# Patient Record
Sex: Female | Born: 1950 | Race: White | Hispanic: No | Smoking: Former smoker
Health system: Southern US, Community
[De-identification: ages and names within clinical notes are randomized; demographics above are authoritative.]

## PROBLEM LIST (undated history)

## (undated) DIAGNOSIS — I959 Hypotension, unspecified: Secondary | ICD-10-CM

## (undated) DIAGNOSIS — C259 Malignant neoplasm of pancreas, unspecified: Secondary | ICD-10-CM

## (undated) DIAGNOSIS — F32A Depression, unspecified: Secondary | ICD-10-CM

## (undated) DIAGNOSIS — S42301A Unspecified fracture of shaft of humerus, right arm, initial encounter for closed fracture: Secondary | ICD-10-CM

## (undated) DIAGNOSIS — F329 Major depressive disorder, single episode, unspecified: Secondary | ICD-10-CM

## (undated) DIAGNOSIS — R109 Unspecified abdominal pain: Secondary | ICD-10-CM

## (undated) DIAGNOSIS — R296 Repeated falls: Secondary | ICD-10-CM

## (undated) DIAGNOSIS — S72001A Fracture of unspecified part of neck of right femur, initial encounter for closed fracture: Secondary | ICD-10-CM

## (undated) DIAGNOSIS — G8929 Other chronic pain: Secondary | ICD-10-CM

## (undated) DIAGNOSIS — C801 Malignant (primary) neoplasm, unspecified: Secondary | ICD-10-CM

## (undated) DIAGNOSIS — K529 Noninfective gastroenteritis and colitis, unspecified: Secondary | ICD-10-CM

## (undated) HISTORY — DX: Malignant neoplasm of pancreas, unspecified: C25.9

## (undated) HISTORY — PX: GASTROSTOMY TUBE PLACEMENT: SHX655

---

## 2008-06-06 ENCOUNTER — Emergency Department (HOSPITAL_COMMUNITY): Admission: EM | Admit: 2008-06-06 | Discharge: 2008-06-07 | Payer: Self-pay | Admitting: Emergency Medicine

## 2010-05-20 LAB — COMPREHENSIVE METABOLIC PANEL
ALT: 27 U/L (ref 0–35)
Alkaline Phosphatase: 125 U/L — ABNORMAL HIGH (ref 39–117)
CO2: 26 mEq/L (ref 19–32)
Calcium: 9.5 mg/dL (ref 8.4–10.5)
Chloride: 103 mEq/L (ref 96–112)
GFR calc non Af Amer: >60 mL/min (ref >60)
Glucose, Bld: 110 mg/dL — ABNORMAL HIGH (ref 70–99)
Sodium: 137 mEq/L (ref 135–145)
Total Bilirubin: 0.9 mg/dL (ref 0.3–1.2)

## 2010-05-20 LAB — URINALYSIS, ROUTINE W REFLEX MICROSCOPIC
Ketones, ur: NEGATIVE mg/dL
Leukocytes, UA: NEGATIVE
Nitrite: NEGATIVE
Protein, ur: NEGATIVE mg/dL
Urobilinogen, UA: 0.2 mg/dL (ref 0.0–1.0)

## 2010-05-20 LAB — CBC
Hemoglobin: 13.8 g/dL (ref 12.0–15.0)
MCHC: 34 g/dL (ref 30.0–36.0)
RBC: 4.67 MIL/uL (ref 3.87–5.11)

## 2010-05-20 LAB — POCT CARDIAC MARKERS
CKMB, poc: 1.1 ng/mL (ref 1.0–8.0)
Myoglobin, poc: 53.9 ng/mL (ref 12–200)
Troponin i, poc: <0.05 ng/mL (ref 0.00–0.09)

## 2010-05-20 LAB — DIFFERENTIAL
Eosinophils Relative: 2 % (ref 0–5)
Neutrophils Relative %: 56 % (ref 43–77)

## 2010-05-20 LAB — URINE MICROSCOPIC-ADD ON

## 2010-08-14 ENCOUNTER — Other Ambulatory Visit: Payer: Self-pay | Admitting: Medical

## 2010-09-01 ENCOUNTER — Other Ambulatory Visit: Payer: Self-pay | Admitting: Medical

## 2013-01-15 ENCOUNTER — Other Ambulatory Visit: Payer: Self-pay | Admitting: *Deleted

## 2014-02-27 ENCOUNTER — Encounter (HOSPITAL_COMMUNITY): Payer: Self-pay | Admitting: Emergency Medicine

## 2014-02-27 ENCOUNTER — Emergency Department (HOSPITAL_COMMUNITY)
Admission: EM | Admit: 2014-02-27 | Discharge: 2014-02-27 | Disposition: A | Discharge status: Home / Self Care | Payer: Medicaid Other | Attending: Emergency Medicine | Admitting: Emergency Medicine

## 2014-02-27 DIAGNOSIS — Z8781 Personal history of (healed) traumatic fracture: Secondary | ICD-10-CM | POA: Diagnosis not present

## 2014-02-27 DIAGNOSIS — F329 Major depressive disorder, single episode, unspecified: Secondary | ICD-10-CM | POA: Insufficient documentation

## 2014-02-27 DIAGNOSIS — R140 Abdominal distension (gaseous): Secondary | ICD-10-CM | POA: Diagnosis present

## 2014-02-27 DIAGNOSIS — Z8507 Personal history of malignant neoplasm of pancreas: Secondary | ICD-10-CM | POA: Diagnosis not present

## 2014-02-27 DIAGNOSIS — Z7982 Long term (current) use of aspirin: Secondary | ICD-10-CM | POA: Insufficient documentation

## 2014-02-27 DIAGNOSIS — K9413 Enterostomy malfunction: Secondary | ICD-10-CM | POA: Diagnosis not present

## 2014-02-27 DIAGNOSIS — G8929 Other chronic pain: Secondary | ICD-10-CM | POA: Insufficient documentation

## 2014-02-27 DIAGNOSIS — Z8679 Personal history of other diseases of the circulatory system: Secondary | ICD-10-CM | POA: Diagnosis not present

## 2014-02-27 DIAGNOSIS — T85598A Other mechanical complication of other gastrointestinal prosthetic devices, implants and grafts, initial encounter: Secondary | ICD-10-CM

## 2014-02-27 DIAGNOSIS — Z79899 Other long term (current) drug therapy: Secondary | ICD-10-CM | POA: Diagnosis not present

## 2014-02-27 HISTORY — DX: Other chronic pain: G89.29

## 2014-02-27 HISTORY — DX: Noninfective gastroenteritis and colitis, unspecified: K52.9

## 2014-02-27 HISTORY — DX: Major depressive disorder, single episode, unspecified: F32.9

## 2014-02-27 HISTORY — DX: Hypotension, unspecified: I95.9

## 2014-02-27 HISTORY — DX: Malignant (primary) neoplasm, unspecified: C80.1

## 2014-02-27 HISTORY — DX: Unspecified fracture of shaft of humerus, right arm, initial encounter for closed fracture: S42.301A

## 2014-02-27 HISTORY — DX: Unspecified abdominal pain: R10.9

## 2014-02-27 HISTORY — DX: Depression, unspecified: F32.A

## 2014-02-27 HISTORY — DX: Fracture of unspecified part of neck of right femur, initial encounter for closed fracture: S72.001A

## 2014-02-27 HISTORY — DX: Repeated falls: R29.6

## 2014-02-27 NOTE — ED Notes (Signed)
Pt was transported back to [facility], [place], via [facility].

## 2014-02-27 NOTE — ED Notes (Addendum)
Pt from [facility]. Pt reports g-tube obstruction x2 days. Pt reports staff at [facility] have tried coca-cola and ginger ale via tube with no relief. Pt denies any abdominal pain,n/v/d.

## 2014-02-27 NOTE — ED Notes (Addendum)
EDP at bedside requesting "guide wire." Central Line kit containing guidewire at bedside.

## 2014-02-27 NOTE — ED Notes (Signed)
Gave report on pt's discharge instructions to [place], at the [facility], [place], [place].

## 2014-02-27 NOTE — ED Provider Notes (Signed)
CSN: [id number]     Arrival date & time 02/27/14  1711 History  This chart was scribed for Tanna Furry, MD by Tula Nakayama, ED Scribe. This patient was seen in room APA18/APA18 and the patient's care was started at 6:29 PM.    Chief Complaint  Patient presents with  . Bloated   The history is provided by the patient. No language interpreter was used.    HPI Comments: Maureen Vaughn is a 64 y.o. female who presents to the Emergency Department complaining of a constant j-tube obstruction that started 3 days ago. Pt has 3 port jejunal tube and reports feeding tube is occluded. Nurses have tried flushing tube with normal saline and ginger ale with no relief. Pt is a resident of the [facility]. She denies associated pain.    Past Medical History  Diagnosis Date  . Cancer     pancreatic  . Hypotension   . Hip fracture, right   . Fracture of right humerus   . Depression   . Falls frequently   . Chronic abdominal pain   . Chronic diarrhea    Past Surgical History  Procedure Laterality Date  . Gastrostomy tube placement     History reviewed. No pertinent family history. History  Substance Use Topics  . Smoking status: Never Smoker   . Smokeless tobacco: Not on file  . Alcohol Use: No   OB History    No data available     Review of Systems  Constitutional: Negative for fever, chills, diaphoresis, appetite change and fatigue.  HENT: Negative for mouth sores, sore throat and trouble swallowing.   Eyes: Negative for visual disturbance.  Respiratory: Negative for cough, chest tightness, shortness of breath and wheezing.   Cardiovascular: Negative for chest pain.  Gastrointestinal: Negative for nausea, vomiting, abdominal pain, diarrhea and abdominal distention.  Endocrine: Negative for polydipsia, polyphagia and polyuria.  Genitourinary: Negative for dysuria, frequency and hematuria.  Musculoskeletal: Negative for gait problem.  Skin: Negative for color change, pallor and rash.   Neurological: Negative for dizziness, syncope, light-headedness and headaches.  Hematological: Does not bruise/bleed easily.  Psychiatric/Behavioral: Negative for behavioral problems and confusion.    Allergies  Review of patient's allergies indicates no known allergies.  Home Medications   Prior to Admission medications   Medication Sig Start Date End Date Taking? Authorizing Provider  aspirin EC 81 MG tablet 81 mg by Gastric Tube route daily.   Yes Historical Provider, MD  diazepam (VALIUM) 10 MG tablet 10 mg by Gastric Tube route every 8 (eight) hours.   Yes Historical Provider, MD  ferrous sulfate 300 (60 FE) MG/5ML syrup 300 mg by Gastric Tube route daily.   Yes Historical Provider, MD  FLUoxetine (PROZAC) 20 MG tablet 20 mg by Gastric Tube route daily.   Yes Historical Provider, MD  geriatric multivitamins-minerals (ELDERTONIC/GEVRABON) ELIX 15 mLs by Gastric Tube route 2 (two) times daily.   Yes Historical Provider, MD  lipase/protease/amylase (CREON) 12000 UNITS CPEP capsule 24,000 Units by Gastric Tube route 3 (three) times daily before meals.   Yes Historical Provider, MD  LORazepam (ATIVAN) 0.5 MG tablet Place 0.5 mg under the tongue as needed (for nausea).   Yes Historical Provider, MD  morphine (MS CONTIN) 15 MG 12 hr tablet 15 mg by Gastric Tube route every 12 (twelve) hours.   Yes Historical Provider, MD  Nutritional Supplements (FEEDING SUPPLEMENT, JEVITY 1.2 CAL,) LIQD Place 1,000 mLs into feeding tube See admin instructions. 12ms per hour for  24 hours. *Turn off at 10am, then resume at 2pm. Flush with 19ms of water every 8 hours. (326m prior to meds and 6075mpost meds if given via G-Tube*   Yes Historical Provider, MD  omeprazole (PRILOSEC) 20 MG capsule 20 mg by Gastric Tube route daily. *take 30 to 60 minutes before eating   Yes Historical Provider, MD  Oxycodone HCl 10 MG TABS 10 mg by Gastric Tube route every 6 (six) hours as needed (for pain).   Yes Historical  Provider, MD  potassium chloride SA (K-DUR,KLOR-CON) 20 MEQ tablet 20 mEq by Gastric Tube route 3 (three) times daily.   Yes Historical Provider, MD  sucralfate (CARAFATE) 1 GM/10ML suspension 1 g by Gastric Tube route every 6 (six) hours.   Yes Historical Provider, MD  vitamin B-12 (CYANOCOBALAMIN) 1000 MCG tablet 1,000 mcg by Gastric Tube route daily.   Yes Historical Provider, MD   BP 109/68 mmHg  Pulse 74  Temp(Src) 97.8 F (36.6 C) (Oral)  Resp 16  Ht 5' (1.524 m)  Wt 81 lb 4.8 oz (36.877 kg)  BMI 15.88 kg/m2  SpO2 95% Physical Exam  Constitutional: She is oriented to person, place, and time. She appears well-developed and well-nourished. No distress.  HENT:  Head: Normocephalic.  Eyes: Conjunctivae are normal. Pupils are equal, round, and reactive to light. No scleral icterus.  Neck: Normal range of motion. Neck supple. No thyromegaly present.  Cardiovascular: Normal rate and regular rhythm.  Exam reveals no gallop and no friction rub.   No murmur heard. Pulmonary/Chest: Effort normal and breath sounds normal. No respiratory distress. She has no wheezes. She has no rales.  Abdominal: Soft. Bowel sounds are normal. She exhibits no distension. There is no tenderness. There is no rebound.  epigastrium triple lumen jejunal feeding tube noted; gastric port patent; med port occluded  Musculoskeletal: Normal range of motion.  Neurological: She is alert and oriented to person, place, and time.  Skin: Skin is warm and dry. No rash noted.  Psychiatric: She has a normal mood and affect. Her behavior is normal.  Nursing note and vitals reviewed.   ED Course  Procedures (including critical care time) DIAGNOSTIC STUDIES: Oxygen Saturation is 97% on RA, normal by my interpretation.    COORDINATION OF CARE: 6:35 PM Discussed treatment plan with pt at bedside and pt agreed to plan.    Labs Review Labs Reviewed - No data to display  Imaging Review No results found.   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 Recent hip and humeral fractures. Failure to thrive. Accommodation G-tube/duodenal feeding tube placed at [facility]. In the emergency room here her aspiration port is patent. Her G-tube port is patent. The central jejunal feeding port is noted. I'm unable to pass total saline. A soft flexing wire from a central line kit was placed. Was measured carefully. It will not pass to the length to the tip of the feeding tube.  I discussed the case with Dr. RehDebby Bud on call. He felt the patient could be continued on her jejunal feeds at a lower rate or any non-bolus fashion through her G-tube. Information given to the patient's care facility to call to arrange exchange of her G-tube in invasive radiology  hospital.   I personally performed the services described in this documentation, which was scribed in  my presence. The recorded information has been reviewed and is accurate.    Tanna Furry, MD 02/27/14 [phone]

## 2014-02-27 NOTE — Discharge Instructions (Signed)
Tube will have to be changed in radiology at [facility], [place].  Please call interventional radiology at [facility] to arrange for this to be done. [phone]   Feeds can be given slowly through the patient's G tube port, which remains patent.   -

## 2014-02-27 NOTE — ED Notes (Addendum)
Three port g-tube noted to pt abdomen. Suction and med port patent. Feeding tube port not patent. Gastric contents noted in suction canister after suction port hooked up. Normal saline flushed through med port with no resistance by Agricultural consultant. Pt tolerated well. Ginger ale/normal saline irrigation of feeding port by [employer] also unsuccessful.

## 2015-03-31 ENCOUNTER — Ambulatory Visit (HOSPITAL_COMMUNITY)
Admission: RE | Admit: 2015-03-31 | Discharge: 2015-03-31 | Disposition: A | Discharge status: Home / Self Care | Payer: Medicaid Other | Source: Ambulatory Visit | Attending: Oncology | Admitting: Oncology

## 2015-03-31 DIAGNOSIS — N390 Urinary tract infection, site not specified: Secondary | ICD-10-CM | POA: Diagnosis not present

## 2015-03-31 DIAGNOSIS — C259 Malignant neoplasm of pancreas, unspecified: Secondary | ICD-10-CM | POA: Insufficient documentation

## 2015-03-31 MED ORDER — SODIUM CHLORIDE 0.9% FLUSH: 10.0000 mL | INTRAVENOUS | Status: DC | PRN

## 2015-03-31 MED ORDER — SODIUM CHLORIDE 0.9% FLUSH: 10.0000 mL | Freq: Two times a day (BID) | INTRAVENOUS | Status: DC

## 2015-03-31 NOTE — Discharge Instructions (Signed)
PICC Insertion, Care After Refer to this sheet in the next few weeks. These instructions provide you with information on caring for yourself after your procedure. Your health care provider may also give you more specific instructions. Your treatment has been planned according to current medical practices, but problems sometimes occur. Call your health care provider if you have any problems or questions after your procedure. WHAT TO EXPECT AFTER THE PROCEDURE After your procedure, it is typical to have the following:  Mild discomfort at the insertion site. This should not last more than a day. HOME CARE INSTRUCTIONS  Rest at home for the remainder of the day after the procedure.  You may bend your arm and move it freely. If your PICC is near or at the bend of your elbow, avoid activity with repeated motion at the elbow.  Avoid lifting heavy objects as instructed by your health care provider.  Avoid using a crutch with the arm on the same side as your PICC. You may need to use a walker. Bandage Care  Keep your PICC bandage (dressing) clean and dry to prevent infection.  Ask your health care provider when you may shower. To keep the dressing dry, cover the PICC with plastic wrap and tape before showering. If the dressing does become wet, replace it right after the shower.  Do not soak in the bath, swim, or use hot tubs when you have a PICC.  Change the PICC dressing as instructed by your health care provider.  Change your PICC dressing if it becomes loose or wet. General PICC Care  Check the PICC insertion site daily for leakage, redness, swelling, or pain.  Flush the PICC as directed by your health care provider. Let your health care provider know right away if the PICC is difficult to flush or does not flush. Do not use force to flush the PICC.  Do not use a syringe that is less than 10 mL to flush the PICC.  Never pull or tug on the PICC.  Avoid blood pressure checks on the arm  with the PICC.  Keep your PICC identification card with you at all times.  Do not take the PICC out yourself. Only a trained health care professional should remove the PICC. SEEK MEDICAL CARE IF:  You have pain in your arm, ear, face, or teeth.  You have fever or chills.  You have drainage from the PICC insertion site.  You have redness or palpate a "cord" around the PICC insertion site.  You cannot flush the catheter. SEEK IMMEDIATE MEDICAL CARE IF:  You have swelling in the arm in which the PICC is inserted.   This information is not intended to replace advice given to you by your health care provider. Make sure you discuss any questions you have with your health care provider.   Document Released: 11/15/2012 Document Revised: 01/30/2013 Document Reviewed: 11/15/2012 Elsevier Interactive Patient Education 2016 [place] Catheter/Midline Placement  The IV Nurse has discussed with the patient and/or persons authorized to consent for the patient, the purpose of this procedure and the potential benefits and risks involved with this procedure.  The benefits include less needle sticks, lab draws from the catheter and patient may be discharged home with the catheter.  Risks include, but not limited to, infection, bleeding, blood clot (thrombus formation), and puncture of an artery; nerve damage and irregular heat beat.  Alternatives to this procedure were also discussed.  PICC/Midline Placement Documentation  PICC Single  Lumen Q000111Q PICC Right Basilic 36 cm 1 cm (Active)  Indication for Insertion or Continuance of Line Administration of hyperosmolar/irritating solutions (i.e. TPN, Vancomycin, etc.);Prolonged intravenous therapies;Limited venous access - need for IV therapy >5 days (PICC only) 03/31/2015  2:23 PM  Exposed Catheter (cm) 1 cm 03/31/2015  2:23 PM  Site Assessment Clean;Dry;Intact 03/31/2015  2:23 PM  Line Status Blood return noted;Capped  (central line);Flushed 03/31/2015  2:23 PM  Dressing Type Transparent;Securing device 03/31/2015  2:23 PM  Dressing Status Clean;Dry;Intact 03/31/2015  2:23 PM  Line Care Cap(s) changed 03/31/2015  2:23 PM  Line Adjustment (NICU/IV Team Only) No 03/31/2015  2:23 PM  Dressing Intervention New dressing 03/31/2015  2:23 PM       Donya Hitch, Essie Hart 03/31/2015, 2:26 PM PICC Home Guide A peripherally inserted central catheter (PICC) is a long, thin, flexible tube that is inserted into a vein in the upper arm. It is a form of intravenous (IV) access. It is considered to be a "central" line because the tip of the PICC ends in a large vein in your chest. This large vein is called the superior vena cava (SVC). The PICC tip ends in the SVC because there is a lot of blood flow in the SVC. This allows medicines and IV fluids to be quickly distributed throughout the body. The PICC is inserted using a sterile technique by a specially trained nurse or physician. After the PICC is inserted, a chest X-ray exam is done to be sure it is in the correct place.  A PICC may be placed for different reasons, such as:  To give medicines and liquid nutrition that can only be given through a central line. Examples are:  Certain antibiotic treatments.  Chemotherapy.  Total parenteral nutrition (TPN).  To take frequent blood samples.  To give IV fluids and blood products.  If there is difficulty placing a peripheral intravenous (PIV) catheter. If taken care of properly, a PICC can remain in place for several months. A PICC can also allow a person to go home from the hospital early. Medicine and PICC care can be managed at home by a family member or home health care team. [place] A PICC? Problems with a PICC can occasionally occur. These may include the following:  A blood clot (thrombus) forming in or at the tip of the PICC. This can cause the PICC to become clogged. A clot-dissolving  medicine called tissue plasminogen activator (tPA) can be given through the PICC to help break up the clot.  Inflammation of the vein (phlebitis) in which the PICC is placed. Signs of inflammation may include redness, pain at the insertion site, red streaks, or being able to feel a "cord" in the vein where the PICC is located.  Infection in the PICC or at the insertion site. Signs of infection may include fever, chills, redness, swelling, or pus drainage from the PICC insertion site.  PICC movement (malposition). The PICC tip may move from its original position due to excessive physical activity, forceful coughing, sneezing, or vomiting.  A break or cut in the PICC. It is important to not use scissors near the PICC.  Nerve or tendon irritation or injury during PICC insertion. WHAT SHOULD I KEEP IN MIND ABOUT ACTIVITIES WHEN I HAVE A PICC?  You may bend your arm and move it freely. If your PICC is near or at the bend of your elbow, avoid activity with repeated motion at the elbow.  Rest at home for the remainder of the day following PICC line insertion.  Avoid lifting heavy objects as instructed by your health care provider.  Avoid using a crutch with the arm on the same side as your PICC. You may need to use a walker. WHAT SHOULD I KNOW ABOUT MY PICC DRESSING?  Keep your PICC bandage (dressing) clean and dry to prevent infection.  Ask your health care provider when you may shower. Ask your health care provider to teach you how to wrap the PICC when you do take a shower.  Change the PICC dressing as instructed by your health care provider.  Change your PICC dressing if it becomes loose or wet. WHAT SHOULD I KNOW ABOUT PICC CARE?  Check the PICC insertion site daily for leakage, redness, swelling, or pain.  Do not take a bath, swim, or use hot tubs when you have a PICC. Cover PICC line with clear plastic wrap and tape to keep it dry while showering.  Flush the PICC as directed by  your health care provider. Let your health care provider know right away if the PICC is difficult to flush or does not flush. Do not use force to flush the PICC.  Do not use a syringe that is less than 10 mL to flush the PICC.  Never pull or tug on the PICC.  Avoid blood pressure checks on the arm with the PICC.  Keep your PICC identification card with you at all times.  Do not take the PICC out yourself. Only a trained clinical professional should remove the PICC. SEEK IMMEDIATE MEDICAL CARE IF:  Your PICC is accidentally pulled all the way out. If this happens, cover the insertion site with a bandage or gauze dressing. Do not throw the PICC away. Your health care provider will need to inspect it.  Your PICC was tugged or pulled and has partially come out. Do not  push the PICC back in.  There is any type of drainage, redness, or swelling where the PICC enters the skin.  You cannot flush the PICC, it is difficult to flush, or the PICC leaks around the insertion site when it is flushed.  You hear a "flushing" sound when the PICC is flushed.  You have pain, discomfort, or numbness in your arm, shoulder, or jaw on the same side as the PICC.  You feel your heart "racing" or skipping beats.  You notice a hole or tear in the PICC.  You develop chills or a fever. MAKE SURE YOU:   Understand these instructions.  Will watch your condition.  Will get help right away if you are not doing well or get worse.   This information is not intended to replace advice given to you by your health care provider. Make sure you discuss any questions you have with your health care provider.   Document Released: 08/01/2002 Document Revised: 02/15/2014 Document Reviewed: 10/02/2012 Elsevier Interactive Patient Education [employer].

## 2015-03-31 NOTE — Progress Notes (Signed)
Peripherally Inserted Central Catheter/Midline Placement  The IV Nurse has discussed with the patient and/or persons authorized to consent for the patient, the purpose of this procedure and the potential benefits and risks involved with this procedure.  The benefits include less needle sticks, lab draws from the catheter and patient may be discharged home with the catheter.  Risks include, but not limited to, infection, bleeding, blood clot (thrombus formation), and puncture of an artery; nerve damage and irregular heat beat.  Alternatives to this procedure were also discussed.  PICC/Midline Placement Documentation  PICC Single Lumen Q000111Q PICC Right Basilic 36 cm 1 cm (Active)  Indication for Insertion or Continuance of Line Administration of hyperosmolar/irritating solutions (i.e. TPN, Vancomycin, etc.);Prolonged intravenous therapies;Limited venous access - need for IV therapy >5 days (PICC only) 03/31/2015  2:23 PM  Exposed Catheter (cm) 1 cm 03/31/2015  2:23 PM  Site Assessment Clean;Dry;Intact 03/31/2015  2:23 PM  Line Status Blood return noted;Capped (central line);Flushed 03/31/2015  2:23 PM  Dressing Type Transparent;Securing device 03/31/2015  2:23 PM  Dressing Status Clean;Dry;Intact 03/31/2015  2:23 PM  Line Care Cap(s) changed 03/31/2015  2:23 PM  Line Adjustment (NICU/IV Team Only) No 03/31/2015  2:23 PM  Dressing Intervention New dressing 03/31/2015  2:23 PM       Leiani Enright, Essie Hart 03/31/2015, 2:26 PM

## 2015-12-22 DIAGNOSIS — E7801 Familial hypercholesterolemia: Secondary | ICD-10-CM | POA: Diagnosis not present

## 2015-12-22 DIAGNOSIS — I10 Essential (primary) hypertension: Secondary | ICD-10-CM | POA: Diagnosis not present

## 2015-12-22 DIAGNOSIS — R300 Dysuria: Secondary | ICD-10-CM | POA: Diagnosis not present

## 2015-12-23 ENCOUNTER — Encounter (HOSPITAL_COMMUNITY): Payer: Medicare Other | Attending: Oncology | Admitting: Oncology

## 2015-12-23 ENCOUNTER — Encounter (HOSPITAL_COMMUNITY): Payer: Self-pay | Admitting: Oncology

## 2015-12-23 ENCOUNTER — Encounter (HOSPITAL_COMMUNITY): Payer: Medicare Other

## 2015-12-23 VITALS — BP 125/56 | HR 62 | Temp 97.8°F | Resp 18 | Ht 60.0 in | Wt 112.6 lb

## 2015-12-23 DIAGNOSIS — F1021 Alcohol dependence, in remission: Secondary | ICD-10-CM

## 2015-12-23 DIAGNOSIS — Z87891 Personal history of nicotine dependence: Secondary | ICD-10-CM | POA: Diagnosis not present

## 2015-12-23 DIAGNOSIS — Z862 Personal history of diseases of the blood and blood-forming organs and certain disorders involving the immune mechanism: Secondary | ICD-10-CM

## 2015-12-23 DIAGNOSIS — C259 Malignant neoplasm of pancreas, unspecified: Secondary | ICD-10-CM | POA: Diagnosis not present

## 2015-12-23 DIAGNOSIS — C250 Malignant neoplasm of head of pancreas: Secondary | ICD-10-CM | POA: Diagnosis present

## 2015-12-23 DIAGNOSIS — Z87898 Personal history of other specified conditions: Secondary | ICD-10-CM

## 2015-12-23 DIAGNOSIS — R197 Diarrhea, unspecified: Secondary | ICD-10-CM

## 2015-12-23 DIAGNOSIS — K909 Intestinal malabsorption, unspecified: Secondary | ICD-10-CM

## 2015-12-23 HISTORY — DX: Malignant neoplasm of pancreas, unspecified: C25.9

## 2015-12-23 LAB — CBC WITH DIFFERENTIAL/PLATELET
BASOS ABS: 0 10*3/uL (ref 0.0–0.1)
BASOS PCT: 0 %
EOS ABS: 0.2 10*3/uL (ref 0.0–0.7)
EOS PCT: 2 %
HCT: 38.3 % (ref 36.0–46.0)
HEMOGLOBIN: 12.5 g/dL (ref 12.0–15.0)
Lymphocytes Relative: 20 %
Lymphs Abs: 2.4 10*3/uL (ref 0.7–4.0)
MCH: 27.4 pg (ref 26.0–34.0)
MCHC: 32.6 g/dL (ref 30.0–36.0)
MCV: 84 fL (ref 78.0–100.0)
Monocytes Absolute: 0.7 10*3/uL (ref 0.1–1.0)
Monocytes Relative: 6 %
NEUTROS PCT: 72 %
Neutro Abs: 8.8 10*3/uL — ABNORMAL HIGH (ref 1.7–7.7)
PLATELETS: 264 10*3/uL (ref 150–400)
RBC: 4.56 MIL/uL (ref 3.87–5.11)
RDW: 16.9 % — ABNORMAL HIGH (ref 11.5–15.5)
WBC: 12.1 10*3/uL — AB (ref 4.0–10.5)

## 2015-12-23 LAB — COMPREHENSIVE METABOLIC PANEL
ALBUMIN: 4.1 g/dL (ref 3.5–5.0)
ALT: 21 U/L (ref 14–54)
AST: 29 U/L (ref 15–41)
Alkaline Phosphatase: 119 U/L (ref 38–126)
Anion gap: 7 (ref 5–15)
BUN: 13 mg/dL (ref 6–20)
CHLORIDE: 105 mmol/L (ref 101–111)
CO2: 25 mmol/L (ref 22–32)
CREATININE: 0.94 mg/dL (ref 0.44–1.00)
Calcium: 9.2 mg/dL (ref 8.9–10.3)
GFR calc non Af Amer: >60 mL/min (ref >60)
GLUCOSE: 101 mg/dL — AB (ref 65–99)
Potassium: 4.2 mmol/L (ref 3.5–5.1)
SODIUM: 137 mmol/L (ref 135–145)
Total Bilirubin: 0.6 mg/dL (ref 0.3–1.2)
Total Protein: 7.4 g/dL (ref 6.5–8.1)

## 2015-12-23 NOTE — Patient Instructions (Signed)
[place] at [facility] Discharge Instructions  RECOMMENDATIONS MADE BY THE CONSULTANT AND ANY TEST RESULTS WILL BE SENT TO YOUR REFERRING PHYSICIAN.  You were seen today by Kirby Crigler PA-C and Dr. Whitney Muse. Labs drawn today, will call with results. Ct scans will be scheduled. Return in 6 months for labs, Ct scans and follow up with Dr. Whitney Muse.   Thank you for choosing [place] at [facility] to provide your oncology and hematology care.  To afford each patient quality time with our provider, please arrive at least 15 minutes before your scheduled appointment time.   Beginning January 23rd 2017 lab work for the [facility] will be done in the  Main lab at [facility] on 1st floor. If you have a lab appointment with the [facility] please come in thru the  Main Entrance and check in at the main information desk  You need to re-schedule your appointment should you arrive 10 or more minutes late.  We strive to give you quality time with our providers, and arriving late affects you and other patients whose appointments are after yours.  Also, if you no show three or more times for appointments you may be dismissed from the clinic at the providers discretion.     Again, thank you for choosing [facility].  Our hope is that these requests will decrease the amount of time that you wait before being seen by our physicians.       _____________________________________________________________  Should you have questions after your visit to [facility], please contact our office at [phone] between the hours of 8:30 a.m. and 4:30 p.m.  Voicemails left after 4:30 p.m. will not be returned until the following business day.  For prescription refill requests, have your pharmacy contact our office.         Resources For Cancer Patients and their Caregivers ? American Cancer Society: Can assist with transportation,  wigs, general needs, runs Look Good Feel Better.        (548)381-6582 ? Cancer Care: Provides financial assistance, online support groups, medication/co-pay assistance.  1-800-813-HOPE 806-395-2441) ? [place] Assists [employer] cancer patients and their families through emotional , educational and financial support.  304-775-9531 ? [place] DSS Where to apply for food stamps, Medicaid and utility assistance. 623-619-4850 ? [facility]: Transportation to medical appointments. 331 090 8395 ? Social Security Administration: May apply for disability if have a Stage IV cancer. (917) 514-0679 (952) 618-5771 ? [employer], Disability and Transit Services: Assists with nutrition, care and transit needs. [place] Support Programs: @10RELATIVEDAYS @ > Cancer Support Group  2nd Tuesday of the month 1pm-2pm, Journey Room  > Creative Journey  3rd Tuesday of the month 1130am-1pm, Journey Room  > Look Good Feel Better  1st Wednesday of the month 10am-12 noon, Journey Room (Call [place] to register [phone])

## 2015-12-23 NOTE — Progress Notes (Signed)
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 Results of the tumor marker test revealed 86.      05/24/2013 Tumor Marker    Patient's tumor was tested for the following markers: CEA. Results of the tumor marker test revealed 5.      06/18/2013 Procedure    Malignant distal CBD stricture in distal 2-3cm of CBD- 10 x 73mm covered metal stent placed         06/21/2013 Procedure    Diagnostic laparoscopy, open pylorus preserving pancreaticoduodenectomy, omentectomy by Dr. Maggie Font ([place])        06/26/2013 Cancer Staging    T3N1MX      06/26/2013 Pathology Results    Adenocarcinoma of distal common bile duct in the head of pancreas, 1.2 cm, moderately differentiated, 1 mitosis/10 HPF, negative margines, perineural invasion identified, peripancreatic soft tissue invasion seen, tumor involves pancreatic acinar tissue and submucosa of duodenum near ampulla, 2/7 positive nodes       HISTORY OF PRESENT ILLNESS:   Maureen Vaughn is a 65 y.o. 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 Maggie Font on 06/21/2013 with a Whipple.  Post-operatively, she was planned for adjuvant systemic chemotherapy but due to a complicated post-operative course, adjuvant chemotherapy never took place.She did have an elevated pre-op CEA (5) and CA19-9 (86).  She reports  chronic abdominal that comes and goes.  Her appetite is strong.  Her weight is reportedly stable.  She notes that her abdominal pain is diffuse abdominal pain, but she points to her epigastric region.    We reviewed her oncology history.  She confirms a complicated post-operative course which delayed and then resulted in the decision to forego adjuvant chemotherapy.  She confirms that she has never received XRT as well.  Her friend notes that her weight has significantly improved since her surgery.  She weighed as little as 72 lbs.    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  She is NOT up to date on mammograms and she refuses to allow me to order and schedule one. She has no major complaints today.    Review of Systems  Constitutional: Negative.  Negative for chills, fever and weight loss.  HENT: Negative.   Eyes: Negative.   Respiratory: Negative.  Negative for cough.   Cardiovascular: Negative.  Negative for chest pain.  Gastrointestinal: Positive for abdominal pain (intermittent, chronic). Negative for constipation, diarrhea, nausea and vomiting.  Genitourinary: Negative for dysuria.  Musculoskeletal: Negative.   Skin: Negative.   Neurological: Negative.  Negative for weakness.  Endo/Heme/Allergies: Negative.   Psychiatric/Behavioral: Negative.   14 point review of systems was performed and is negative except as detailed under history of present illness and above   PAST MEDICAL HISTORY:   Past Medical History:  Diagnosis Date  . Adenocarcinoma  of pancreas ([place]) 12/23/2015  . Cancer [facility])    pancreatic  . Chronic abdominal pain   . Chronic diarrhea   . Depression   . Falls frequently   . Fracture of right humerus   . Hip fracture, right ([place])   . Hypotension     ALLERGIES: No Known Allergies    MEDICATIONS: I have reviewed the patient's current medications.    Current Outpatient Prescriptions on File Prior to Visit  Medication  Sig Dispense Refill  . Oxycodone HCl 10 MG TABS 10 mg by Gastric Tube route every 6 (six) hours as needed (for pain).     No current facility-administered medications on file prior to visit.      PAST SURGICAL HISTORY Past Surgical History:  Procedure Laterality Date  . GASTROSTOMY TUBE PLACEMENT      FAMILY HISTORY: Family History  Problem Relation Age of Onset  . Adopted: Yes   She is adopted.  1 son 49 yo and 1 daughter 25 yo both healthy.  2 grandchildren and 4 great-grandchildren.  SOCIAL HISTORY:  reports that she quit smoking about 5 years ago. She has a 70.00 pack-year smoking history. She has never used smokeless tobacco. She reports that she does not drink alcohol or use drugs.  She has a history of EtOHism, drinking 1/2 to 1 case of beer per week.  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  Social History   Social History  . Marital status: Legally Separated    Spouse name: N/A  . Number of children: N/A  . Years of education: N/A   Social History Main Topics  . Smoking status: Former Smoker    Packs/day: 2.00    Years: 35.00    Quit date: 12/23/2010  . Smokeless tobacco: Never Used  . Alcohol use No     Comment: H/O of EtOHism drinking 1/2-1 case of beer per week  . Drug use: No  . Sexual activity: Not Asked   Other Topics Concern  . None   Social History Narrative  . None    PERFORMANCE STATUS: The patient's performance status is 1 - Symptomatic but completely ambulatory  PHYSICAL EXAM: Most Recent Vital Signs: Blood pressure (!) 125/56, pulse 62, temperature 97.8 F (36.6 C), temperature source Oral, resp. rate 18, height 5' (1.524 m), weight 112 lb 9.6 oz (51.1 kg), SpO2 98 %. General appearance: alert, cooperative, appears older than stated age, no distress and accompanied by friend/caretaker Head: Normocephalic, without obvious abnormality, atraumatic Eyes: negative findings: lids and lashes  normal, conjunctivae and sclerae normal and corneas clear Ears: normal TM's and external ear canals both ears Throat: normal findings: lips normal without lesions, buccal mucosa normal, palate normal, tongue midline and normal, soft palate, uvula, and tonsils normal and oropharynx pink & moist without lesions or evidence of thrush and abnormal findings: dentition: upper dentures Neck: no adenopathy and supple, symmetrical, trachea midline Lungs: clear to auscultation bilaterally and normal percussion bilaterally Heart: regular rate and rhythm, S1, S2 normal, no murmur, click, rub or gallop Abdomen: soft, non-tender; bowel sounds normal; no masses,  no organomegaly Extremities: extremities normal, atraumatic, no cyanosis or edema Skin: Skin color, texture, turgor normal. No rashes or lesions Lymph nodes: Cervical, supraclavicular, and axillary nodes normal. Neurologic: Grossly normal  LABORATORY DATA:  CBC    Component Value Date/Time   WBC 12.1 (H) 12/23/2015 1309   RBC 4.56 12/23/2015 1309  HGB 12.5 12/23/2015 1309   HCT 38.3 12/23/2015 1309   PLT 264 12/23/2015 1309   MCV 84.0 12/23/2015 1309   MCH 27.4 12/23/2015 1309   MCHC 32.6 12/23/2015 1309   RDW 16.9 (H) 12/23/2015 1309   LYMPHSABS 2.4 12/23/2015 1309   MONOABS 0.7 12/23/2015 1309   EOSABS 0.2 12/23/2015 1309   BASOSABS 0.0 12/23/2015 1309     Chemistry      Component Value Date/Time   NA 137 12/23/2015 1309   K 4.2 12/23/2015 1309   CL 105 12/23/2015 1309   CO2 25 12/23/2015 1309   BUN 13 12/23/2015 1309   CREATININE 0.94 12/23/2015 1309      Component Value Date/Time   CALCIUM 9.2 12/23/2015 1309   ALKPHOS 119 12/23/2015 1309   AST 29 12/23/2015 1309   ALT 21 12/23/2015 1309   BILITOT 0.6 12/23/2015 1309     Results for HELANA, KNABLE (MRN [id number])   Ref. 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 Maggie Font on 06/21/2013 with a Whipple.  Post-operatively, she was planned for adjuvant systemic chemotherapy but due to a complicated post-operative course, adjuvant chemotherapy never took place.  She did have an elevated pre-op CEA (5) and CA19-9 (86).  Oncology history is developed with available information.  She is on creon for her diarrhea, it is stable and unchanged. She has regained significant weight since her surgery.  Thrombocytosis resolved.  Staging in [facility] problem list is completed at well.  Labs today: CBC diff, CMET, CEA, CA 19-9.  Labs in 6 months: CBC diff, CMET, CEA, CA-19-9.  Order is placed for CT abd/pelvis with contrast in 1-2 weeks for surveillance of pancreatic cancer.   Order is placed for CT abd w contrast in 6 months for ongoing surveillance of pancreatic cancer.  Return in 6 months to review labs and imaging, and for ongoing follow-up in accordance with NCCN guidelines.  Of course, if her labs or imaging are abnormal, we will re-call the patient back to the clinic for a follow-up visit sooner than initially planned.  NCCN guidelines pertaining to surveillance of pancreatic cancer in the postoperative adjuvant setting is as follows (3.2017): 1. Surveillance every 3-6 months for 2 years, then 6-12 months.  A. H+P for symptom assessment  B. CA 19-9 level (category 2B)  C. CT abdomen with contrast (category 2B)    ORDERS PLACED FOR THIS ENCOUNTER: Orders Placed This Encounter  Procedures  . CT Abdomen Pelvis W Contrast  . CT Abdomen W Contrast  . CBC with Differential  . Comprehensive metabolic panel  . CEA  . Cancer antigen 19-9  . CBC with Differential  .  Comprehensive metabolic panel  . CEA  . Cancer antigen 19-9  . Ambulatory referral to Social Work  . Ambulatory referral to Social Work   No orders of the defined types were placed in this encounter.   All questions were answered. The patient knows to call the clinic with any problems, questions or concerns. We can certainly see the patient much sooner if necessary.  This note is electronically signed [id number] Cyril Mourning,  MD  12/23/2015 5:23 PM

## 2015-12-23 NOTE — Assessment & Plan Note (Addendum)
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 Maggie Font on 06/21/2013 with a Whipple.  Post-operatively, she was planned for adjuvant systemic chemotherapy but due to a complicated post-operative course, adjuvant chemotherapy never took place.  She did have an elevated pre-op CEA (5) and CA19-9 (86).  Oncology history is developed with available information.  Staging in [facility] problem list is completed at well.  Labs today: CBC diff, CMET, CEA, CA 19-9.  Labs in 6 months: CBC diff, CMET, CEA, CA-19-9.  Order is placed for CT abd/pelvis with contrast in 1-2 weeks for surveillance of pancreatic cancer.   Order is placed for CT abd w contrast in 6 months for ongoing surveillance of pancreatic cancer.  Return in 6 months to review labs and imaging, and for ongoing follow-up in accordance with NCCN guidelines.  Of course, if her labs or imaging are abnormal, we will re-call the patient back to the clinic for a follow-up visit sooner than initially planned.  NCCN guidelines pertaining to surveillance of pancreatic cancer in the postoperative adjuvant setting is as follows (3.2017): 1. Surveillance every 3-6 months for 2 years, then 6-12 months.  A. H+P for symptom assessment  B. CA 19-9 level (category 2B)  C. CT abdomen with contrast (category 2B)

## 2015-12-24 LAB — CEA: CEA: 3.3 ng/mL (ref 0.0–4.7)

## 2015-12-24 LAB — CANCER ANTIGEN 19-9: CA 19 9: 13 U/mL (ref 0–35)

## 2015-12-28 ENCOUNTER — Encounter (HOSPITAL_COMMUNITY): Payer: Self-pay | Admitting: Oncology

## 2016-01-06 ENCOUNTER — Encounter: Payer: Self-pay | Admitting: *Deleted

## 2016-01-06 ENCOUNTER — Ambulatory Visit (HOSPITAL_COMMUNITY)
Admission: RE | Admit: 2016-01-06 | Discharge: 2016-01-06 | Disposition: A | Discharge status: Home / Self Care | Payer: Medicare Other | Source: Ambulatory Visit | Attending: Oncology | Admitting: Oncology

## 2016-01-06 DIAGNOSIS — Z90411 Acquired partial absence of pancreas: Secondary | ICD-10-CM | POA: Insufficient documentation

## 2016-01-06 DIAGNOSIS — C259 Malignant neoplasm of pancreas, unspecified: Secondary | ICD-10-CM | POA: Diagnosis not present

## 2016-01-06 DIAGNOSIS — M4317 Spondylolisthesis, lumbosacral region: Secondary | ICD-10-CM | POA: Insufficient documentation

## 2016-01-06 MED ORDER — IOPAMIDOL (ISOVUE-300) INJECTION 61%
100.0000 mL | Freq: Once | INTRAVENOUS | Status: AC | PRN
  Administered 2016-01-06: 100 mL via INTRAVENOUS

## 2016-01-06 NOTE — Progress Notes (Signed)
[place] Psychosocial Distress Screening Clinical Social Work  Clinical Social Work was referred by distress screening protocol.  The patient scored a 8 on the Psychosocial Distress Thermometer which indicates severe distress. Clinical Social Worker reviewed chart and contacted patient at home to assess for distress and other psychosocial needs. Pt appears to be prescribed medication to address her depression and anxiety concerns. CSW left brief, supportive message explaining role of CSW/Support Programs at [facility]. CSW encouraged pt to return CSW phone call as needed.   ONCBCN DISTRESS SCREENING 12/23/2015  Screening Type Initial Screening  Distress experienced in past week (1-10) 8  Family Problem type Children  Emotional problem type Depression;Nervousness/Anxiety  Physical Problem type Pain;Swollen arms/legs;Other (comment)    Clinical Social Worker follow up needed: No.  If yes, follow up plan:  Loren Racer, [place] Tuesdays   [phone]

## 2016-04-06 DIAGNOSIS — M5136 Other intervertebral disc degeneration, lumbar region: Secondary | ICD-10-CM | POA: Diagnosis not present

## 2016-04-06 DIAGNOSIS — I700 Atherosclerosis of aorta: Secondary | ICD-10-CM | POA: Diagnosis not present

## 2016-04-06 DIAGNOSIS — M5137 Other intervertebral disc degeneration, lumbosacral region: Secondary | ICD-10-CM | POA: Diagnosis not present

## 2016-04-06 DIAGNOSIS — M4307 Spondylolysis, lumbosacral region: Secondary | ICD-10-CM | POA: Diagnosis not present

## 2016-04-06 DIAGNOSIS — M5410 Radiculopathy, site unspecified: Secondary | ICD-10-CM | POA: Diagnosis not present

## 2016-06-17 ENCOUNTER — Encounter (HOSPITAL_COMMUNITY): Payer: Medicare Other | Attending: Oncology

## 2016-06-17 DIAGNOSIS — Z79899 Other long term (current) drug therapy: Secondary | ICD-10-CM | POA: Diagnosis not present

## 2016-06-17 DIAGNOSIS — Z87891 Personal history of nicotine dependence: Secondary | ICD-10-CM | POA: Insufficient documentation

## 2016-06-17 DIAGNOSIS — C259 Malignant neoplasm of pancreas, unspecified: Secondary | ICD-10-CM

## 2016-06-17 DIAGNOSIS — Z8507 Personal history of malignant neoplasm of pancreas: Secondary | ICD-10-CM | POA: Diagnosis not present

## 2016-06-17 LAB — CBC WITH DIFFERENTIAL/PLATELET
Basophils Absolute: 0 10*3/uL (ref 0.0–0.1)
Basophils Relative: 0 %
EOS ABS: 0.3 10*3/uL (ref 0.0–0.7)
EOS PCT: 3 %
HCT: 34.5 % — ABNORMAL LOW (ref 36.0–46.0)
Hemoglobin: 11.1 g/dL — ABNORMAL LOW (ref 12.0–15.0)
LYMPHS ABS: 2.9 10*3/uL (ref 0.7–4.0)
Lymphocytes Relative: 25 %
MCH: 27.9 pg (ref 26.0–34.0)
MCHC: 32.2 g/dL (ref 30.0–36.0)
MCV: 86.7 fL (ref 78.0–100.0)
MONO ABS: 0.5 10*3/uL (ref 0.1–1.0)
MONOS PCT: 5 %
Neutro Abs: 7.9 10*3/uL — ABNORMAL HIGH (ref 1.7–7.7)
Neutrophils Relative %: 67 %
PLATELETS: 308 10*3/uL (ref 150–400)
RBC: 3.98 MIL/uL (ref 3.87–5.11)
RDW: 16.4 % — AB (ref 11.5–15.5)
WBC: 11.6 10*3/uL — AB (ref 4.0–10.5)

## 2016-06-17 LAB — COMPREHENSIVE METABOLIC PANEL
ALT: 15 U/L (ref 14–54)
ANION GAP: 8 (ref 5–15)
AST: 22 U/L (ref 15–41)
Albumin: 3.7 g/dL (ref 3.5–5.0)
Alkaline Phosphatase: 79 U/L (ref 38–126)
BUN: 15 mg/dL (ref 6–20)
CHLORIDE: 108 mmol/L (ref 101–111)
CO2: 22 mmol/L (ref 22–32)
Calcium: 8.6 mg/dL — ABNORMAL LOW (ref 8.9–10.3)
Creatinine, Ser: 0.95 mg/dL (ref 0.44–1.00)
Glucose, Bld: 105 mg/dL — ABNORMAL HIGH (ref 65–99)
Potassium: 4.5 mmol/L (ref 3.5–5.1)
SODIUM: 138 mmol/L (ref 135–145)
Total Bilirubin: 0.4 mg/dL (ref 0.3–1.2)
Total Protein: 6.9 g/dL (ref 6.5–8.1)

## 2016-06-18 LAB — CANCER ANTIGEN 19-9: CA 19-9: 12 U/mL (ref 0–35)

## 2016-06-18 LAB — CEA: CEA: 3.2 ng/mL (ref 0.0–4.7)

## 2016-06-21 ENCOUNTER — Ambulatory Visit (HOSPITAL_COMMUNITY)
Admission: RE | Admit: 2016-06-21 | Discharge: 2016-06-21 | Disposition: A | Discharge status: Home / Self Care | Payer: Medicare Other | Source: Ambulatory Visit | Attending: Oncology | Admitting: Oncology

## 2016-06-21 DIAGNOSIS — C259 Malignant neoplasm of pancreas, unspecified: Secondary | ICD-10-CM | POA: Diagnosis not present

## 2016-06-21 DIAGNOSIS — I700 Atherosclerosis of aorta: Secondary | ICD-10-CM | POA: Insufficient documentation

## 2016-06-21 MED ORDER — IOPAMIDOL (ISOVUE-300) INJECTION 61%
100.0000 mL | Freq: Once | INTRAVENOUS | Status: AC | PRN
  Administered 2016-06-21: 100 mL via INTRAVENOUS

## 2016-06-24 ENCOUNTER — Encounter (HOSPITAL_BASED_OUTPATIENT_CLINIC_OR_DEPARTMENT_OTHER): Payer: Medicare Other | Admitting: Adult Health

## 2016-06-24 ENCOUNTER — Encounter (HOSPITAL_COMMUNITY): Payer: Self-pay | Admitting: Adult Health

## 2016-06-24 ENCOUNTER — Other Ambulatory Visit (HOSPITAL_COMMUNITY): Payer: Medicaid Other

## 2016-06-24 VITALS — BP 140/66 | HR 70 | Temp 98.5°F | Resp 18 | Ht 60.0 in | Wt 122.5 lb

## 2016-06-24 DIAGNOSIS — K8689 Other specified diseases of pancreas: Secondary | ICD-10-CM

## 2016-06-24 DIAGNOSIS — M25551 Pain in right hip: Secondary | ICD-10-CM | POA: Diagnosis not present

## 2016-06-24 DIAGNOSIS — M545 Low back pain: Secondary | ICD-10-CM | POA: Diagnosis not present

## 2016-06-24 DIAGNOSIS — C259 Malignant neoplasm of pancreas, unspecified: Secondary | ICD-10-CM

## 2016-06-24 DIAGNOSIS — M79604 Pain in right leg: Secondary | ICD-10-CM

## 2016-06-24 DIAGNOSIS — G8929 Other chronic pain: Secondary | ICD-10-CM | POA: Diagnosis not present

## 2016-06-24 DIAGNOSIS — M79605 Pain in left leg: Secondary | ICD-10-CM | POA: Diagnosis not present

## 2016-06-24 DIAGNOSIS — R197 Diarrhea, unspecified: Secondary | ICD-10-CM

## 2016-06-24 MED ORDER — PANCRELIPASE (LIP-PROT-AMYL) 36000-114000 UNITS PO CPEP: 36000.0000 [IU] | ORAL_CAPSULE | Freq: Three times a day (TID) | ORAL | 6 refills | Status: DC

## 2016-06-24 NOTE — Patient Instructions (Addendum)
[place] at [facility] Discharge Instructions  RECOMMENDATIONS MADE BY THE CONSULTANT AND ANY TEST RESULTS WILL BE SENT TO YOUR REFERRING PHYSICIAN.  You were seen today by Mike Craze NP. Please take Creon 1 capsule with meals.  Return in 6 months for labs and follow up.   Thank you for choosing [place] at [facility] to provide your oncology and hematology care.  To afford each patient quality time with our provider, please arrive at least 15 minutes before your scheduled appointment time.    If you have a lab appointment with the [facility] please come in thru the  Main Entrance and check in at the main information desk  You need to re-schedule your appointment should you arrive 10 or more minutes late.  We strive to give you quality time with our providers, and arriving late affects you and other patients whose appointments are after yours.  Also, if you no show three or more times for appointments you may be dismissed from the clinic at the providers discretion.     Again, thank you for choosing [facility].  Our hope is that these requests will decrease the amount of time that you wait before being seen by our physicians.       _____________________________________________________________  Should you have questions after your visit to [facility], please contact our office at [phone] between the hours of 8:30 a.m. and 4:30 p.m.  Voicemails left after 4:30 p.m. will not be returned until the following business day.  For prescription refill requests, have your pharmacy contact our office.       Resources For Cancer Patients and their Caregivers ? American Cancer Society: Can assist with transportation, wigs, general needs, runs Look Good Feel Better.        210-133-6735 ? Cancer Care: Provides financial assistance, online support groups, medication/co-pay assistance.  1-800-813-HOPE  (806)062-6114) ? [place] Assists [place] cancer patients and their families through emotional , educational and financial support.  (272)577-8188 ? [place] DSS Where to apply for food stamps, Medicaid and utility assistance. 701 708 9987 ? [facility]: Transportation to medical appointments. 9253231755 ? Social Security Administration: May apply for disability if have a Stage IV cancer. 7795118600 316 392 5031 ? [employer], Disability and Transit Services: Assists with nutrition, care and transit needs. [place] Support Programs: @10RELATIVEDAYS @ > Cancer Support Group  2nd Tuesday of the month 1pm-2pm, Journey Room  > Creative Journey  3rd Tuesday of the month 1130am-1pm, Journey Room  > Look Good Feel Better  1st Wednesday of the month 10am-12 noon, Journey Room (Call [place] to register [phone])

## 2016-06-24 NOTE — Progress Notes (Signed)
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 Results of the tumor marker test revealed 86.      05/24/2013 Tumor Marker    Patient's tumor was tested for the following markers: CEA. Results of the tumor marker test revealed 5.      06/18/2013 Procedure    Malignant distal CBD stricture in distal 2-3cm of CBD- 10 x 62mm covered metal stent placed         06/21/2013 Procedure    Diagnostic laparoscopy, open pylorus preserving pancreaticoduodenectomy, omentectomy by Dr. Maggie Vaughn ([place])        06/26/2013 Cancer Staging    [id number]      06/26/2013 Pathology Results    Adenocarcinoma of distal common bile duct in the head of pancreas, 1.2 cm, moderately differentiated, 1 mitosis/10 HPF, negative margines, perineural invasion identified, peripancreatic soft tissue invasion seen, tumor involves pancreatic acinar tissue and submucosa of duodenum near ampulla, 2/7 positive nodes      01/06/2016 Imaging    CT abd/pelvis- 1. Patient status post Whipple procedure without evidence of pancreatic carcinoma recurrence or metastasis. 2. No surgical complication identified. 3. Bilateral pars defects with grade 1 anterolisthesis at L5-S1.      06/21/2016 Imaging    Ct abd/pelvis- Stable postop changes from Whipple procedure. No evidence of recurrent or metastatic carcinoma within abdomen.  Incidentally noted aortic atherosclerosis and bilateral L5 pars defects, with degenerative disc disease and grade 2 anterolisthesis at L5-S1        INTERVAL HISTORY:  Maureen Vaughn 66 y.o.  female returns for routine follow-up for history of pancreatic cancer.   She is here today with her caregiver, Maureen Vaughn.  Overall, she tells me she has been feeling pretty well. Her fatigue is continuing to improve; currently at 50%. Her appetite is 100% and she is able to enjoy a variety of foods.  Does endorse diarrhea; states that she has only been taking the Creon once per day in the mornings.  She endorses occasional dizziness, which is chronic and not worse than previous. She has chronic back, legs, and hip pain. Her pain is managed by an outside provider.  She continues to walk with a cane. She reports right hip/buttock pain, that often radiates down her leg. PT was recommended by her PCP, but patient declined.   She recently underwent CT abd imaging and is here to review those results today.    REVIEW OF SYSTEMS:  Review of Systems  Constitutional: Positive for fatigue. Negative for chills and fever.  HENT:  Negative.  Negative for lump/mass and nosebleeds.   Eyes: Negative.   Respiratory: Negative.  Negative for cough and shortness of breath.   Cardiovascular: Negative.  Negative for chest pain and leg swelling.  Gastrointestinal: Positive for diarrhea. Negative for abdominal pain, blood in stool, constipation, nausea and vomiting.  Endocrine: Negative.   Genitourinary: Negative.  Negative for dysuria and hematuria.   Musculoskeletal: Positive for arthralgias and back pain.  Skin: Negative.  Negative for rash.  Neurological: Positive for dizziness. Negative for headaches.  Hematological: Negative.  Negative for adenopathy. Does not bruise/bleed easily.  Psychiatric/Behavioral: Negative.  Negative for depression and sleep disturbance. The patient is not nervous/anxious.      PAST MEDICAL/SURGICAL HISTORY:  Past Medical History:  Diagnosis Date  . Adenocarcinoma of pancreas ([place]) 12/23/2015  . Cancer [facility])    pancreatic  . Chronic abdominal pain   . Chronic diarrhea   .  Depression   . Falls frequently   . Fracture of right humerus   . Hip fracture, right ([place])   . Hypotension    Past Surgical History:  Procedure Laterality Date  . GASTROSTOMY TUBE PLACEMENT       SOCIAL HISTORY:  Social History   Social History  . Marital status: Legally Separated    Spouse name: N/A  . Number of children: N/A  . Years of education: N/A   Occupational History  . Not on file.   Social History Main Topics  . Smoking status: Former Smoker    Packs/day: 2.00    Years: 35.00    Quit date: 12/23/2010  . Smokeless tobacco: Never Used  . Alcohol use No     Comment: H/O of EtOHism drinking 1/2-1 case of beer per week  . Drug use: No  . Sexual activity: Not on file   Other Topics Concern  . Not on file   Social History Narrative  . No narrative on file    FAMILY HISTORY:  Family History  Problem Relation Age of Onset  . Adopted: Yes    CURRENT MEDICATIONS:  Outpatient Encounter Prescriptions as of 06/24/2016  Medication Sig Note  . FLUoxetine HCl (PROZAC PO) Take by mouth daily.   . diazepam (VALIUM) 5 MG tablet Take by mouth. 12/23/2015: Received from: [place]: Take 1 tablet by mouth 3 (three) times a day as needed.  . DULoxetine (CYMBALTA) 60 MG capsule Take 60 mg by mouth daily.   Marland Kitchen ibandronate (BONIVA) 150 MG tablet Take by mouth. 12/23/2015: Received from: [place]: Take 150 mg by mouth every 30 (thirty) days. Take one 150 mg tablet monthly on the same day each month. Swallow whole tablet with 6-8 oz of plain water only. Don't eat, drink (except for water), or take oth  . lipase/protease/amylase (CREON) 36000 UNITS CPEP capsule Take 1 capsule (36,000 Units total) by mouth 3 (three) times daily with meals.   . metoprolol succinate (TOPROL-XL) 25 MG 24 hr tablet Take by mouth. 12/23/2015: Received from: [place]: Take 12.5 mg by mouth daily. Takes 1/2 tablet daily  . omeprazole (PRILOSEC) 40 MG  capsule Take 40 mg by mouth daily. 12/23/2015: Received from: External Pharmacy Received Sig: TAKE ONE CAPSULE BY MOUTH DAILY  . ondansetron (ZOFRAN) 4 MG tablet Take 4 mg by mouth every 8 (eight) hours as needed. 12/23/2015: Received from: External Pharmacy Received Sig: TAKE ONE TABLET BY MOUTH EVERY 8 HOURS AS NEEDED  . Oxycodone HCl 10 MG TABS 10 mg by Gastric Tube route every 6 (six) hours as needed (for pain).   . potassium chloride (K-DUR,KLOR-CON) 10 MEQ tablet Take by mouth. 12/23/2015: Received from: [place]: Take 1 tablet by mouth daily.  . [DISCONTINUED] DULoxetine (CYMBALTA) 30 MG capsule TAKE ONE CAPSULE BY MOUTH EVERY DAY FOR DEPRESSION 12/23/2015: Received from: External Pharmacy  . [DISCONTINUED] lipase/protease/amylase (CREON) 36000 UNITS CPEP capsule Take by mouth. 12/23/2015: Received from: [place]: Take 1-3 capsules by mouth 3 (three) times daily with meals.  . [  DISCONTINUED] nitrofurantoin, macrocrystal-monohydrate, (MACROBID) 100 MG capsule TAKE ONE CAPSULE BY MOUTH TWICE DAILY FOR 7 DAYS. 12/23/2015: Received from: External Pharmacy   No facility-administered encounter medications on file as of 06/24/2016.     ALLERGIES:  No Known Allergies   PHYSICAL EXAM:  ECOG Performance status: 1 - 2 - Symptomatic; requires occasional assistance.   Vitals:   06/24/16 1338  BP: 140/66  Pulse: 70  Resp: 18  Temp: 98.5 F (36.9 C)   Filed Weights   06/24/16 1338  Weight: 122 lb 8 oz (55.6 kg)    Physical Exam  Constitutional: She is oriented to person, place, and time and well-developed, well-nourished, and in no distress.  HENT:  Head: Normocephalic.  Mouth/Throat: Oropharynx is clear and moist. No oropharyngeal exudate.  Eyes: Conjunctivae are normal. Pupils are equal, round, and reactive to light. No scleral icterus.  Neck: Normal range of motion. Neck supple.  Cardiovascular: Normal rate and regular rhythm.   Pulmonary/Chest:  Effort normal and breath sounds normal. No respiratory distress. She has no wheezes. She has no rales.  Abdominal: Soft. Bowel sounds are normal. She exhibits no distension. There is no tenderness. There is no rebound and no guarding.  Musculoskeletal: Normal range of motion. She exhibits no edema.  -Requires assistance getting onto exam table -Ambulates with cane   Lymphadenopathy:    She has no cervical adenopathy.  Neurological: She is alert and oriented to person, place, and time. No cranial nerve deficit.  Skin: Skin is warm and dry. No rash noted.  Psychiatric: Mood, memory, affect and judgment normal.  Nursing note and vitals reviewed.    LABORATORY DATA:  I have reviewed the labs as listed.  CBC    Component Value Date/Time   WBC 11.6 (H) 06/17/2016 1245   RBC 3.98 06/17/2016 1245   HGB 11.1 (L) 06/17/2016 1245   HCT 34.5 (L) 06/17/2016 1245   PLT 308 06/17/2016 1245   MCV 86.7 06/17/2016 1245   MCH 27.9 06/17/2016 1245   MCHC 32.2 06/17/2016 1245   RDW 16.4 (H) 06/17/2016 1245   LYMPHSABS 2.9 06/17/2016 1245   MONOABS 0.5 06/17/2016 1245   EOSABS 0.3 06/17/2016 1245   BASOSABS 0.0 06/17/2016 1245   CMP Latest Ref Rng & Units 06/17/2016 12/23/2015 06/06/2008  Glucose 65 - 99 mg/dL 105(H) 101(H) 110(H)  BUN 6 - 20 mg/dL 15 13 15   Creatinine 0.44 - 1.00 mg/dL 0.95 0.94 0.89  Sodium 135 - 145 mmol/L 138 137 137  Potassium 3.5 - 5.1 mmol/L 4.5 4.2 3.5  Chloride 101 - 111 mmol/L 108 105 103  CO2 22 - 32 mmol/L 22 25 26   Calcium 8.9 - 10.3 mg/dL 8.6(L) 9.2 9.5  Total Protein 6.5 - 8.1 g/dL 6.9 7.4 7.3  Total Bilirubin 0.3 - 1.2 mg/dL 0.4 0.6 0.9  Alkaline Phos 38 - 126 U/L 79 119 125(H)  AST 15 - 41 U/L 22 29 23   ALT 14 - 54 U/L 15 21 27    Results for Maureen Vaughn, Maureen Vaughn (MRN [id number])   Ref. Range 06/17/2016 12:45  CA 19-9 Latest Ref Range: 0 - 35 U/mL 12  CEA Latest Ref Range: 0.0 - 4.7 ng/mL 3.2   PENDING LABS:    DIAGNOSTIC IMAGING:  *The following radiologic  images and reports have been reviewed independently and agree with below findings.  CT abd: 06/21/16      PATHOLOGY:  Whipple surgical path: 06/21/13 (done at [facility])  ASSESSMENT & PLAN:   Stage IIB (T3N1M0) adenocarcinoma of pancreas:  -Diagnosed in 06/2013. CEA & CA 19-9 elevated pre-operatively. Treated with Whipple procedure by Dr[facility] Maggie Vaughn [facility].  Her post-op course was complicated by failure to thrive/weight loss, as well as multiple medical issues including hip fracture requiring SNF/rehab.  placement, and required PEG tube placement for malnutrition. Therefore, adjuvant chemotherapy was not given.  -CEA & CA 19-9 tumor markers normalized and remain normal.  -CT abd reviewed with patient and her caregiver. Scan is reassuring and there is no evidence of recurrence. Since she is now 3 years out from her original cancer diagnosis without evidence of recurrent or metastatic disease, we will extend her imaging to annually for 2 more scans (to total 5 years).  We will continue to follow her with visits every 6 months.        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  -She has not been taking Creon as prescribed. Reiterated the importance of taking pancreatic enzymes since Whipple.  -Recommended she resume Creon 36,000 units 3x/day with meals. She may require increased dose based on symptoms; we can re-evaluate at next visit.   -Prescription for Creon e-scribed to her pharmacy.   Arthralgias/Physical deconditioning:  -Recommended she continue follow-up with PCP. Encouraged her to re-consider physical therapy, as it may help improve her pain and will definitely improve her strength and mobility.  She will think about it and discuss with her PCP at future follow-up visits.        Dispo:  -Return to cancer center in 6 months for continued surveillance.    All questions were answered to patient's stated  satisfaction. Encouraged patient to call with any new concerns or questions before her next visit to the cancer center and we can certain see her sooner, if needed.    Plan of care discussed with Dr. Talbert Cage, who agrees with the above aforementioned.    Orders placed this encounter:  No orders of the defined types were placed in this encounter.     Mike Craze, NP [place] [phone]

## 2016-10-26 DIAGNOSIS — I10 Essential (primary) hypertension: Secondary | ICD-10-CM | POA: Diagnosis not present

## 2016-10-26 DIAGNOSIS — Z90411 Acquired partial absence of pancreas: Secondary | ICD-10-CM | POA: Diagnosis not present

## 2016-10-26 DIAGNOSIS — Z720 Tobacco use: Secondary | ICD-10-CM | POA: Diagnosis not present

## 2016-10-26 DIAGNOSIS — N3001 Acute cystitis with hematuria: Secondary | ICD-10-CM | POA: Diagnosis not present

## 2016-10-26 DIAGNOSIS — I82890 Acute embolism and thrombosis of other specified veins: Secondary | ICD-10-CM | POA: Diagnosis not present

## 2016-10-26 DIAGNOSIS — N390 Urinary tract infection, site not specified: Secondary | ICD-10-CM | POA: Diagnosis not present

## 2016-10-26 DIAGNOSIS — B9620 Unspecified Escherichia coli [E. coli] as the cause of diseases classified elsewhere: Secondary | ICD-10-CM | POA: Diagnosis not present

## 2016-10-26 DIAGNOSIS — Z79899 Other long term (current) drug therapy: Secondary | ICD-10-CM | POA: Diagnosis not present

## 2016-10-26 DIAGNOSIS — R109 Unspecified abdominal pain: Secondary | ICD-10-CM | POA: Diagnosis not present

## 2016-10-26 DIAGNOSIS — K219 Gastro-esophageal reflux disease without esophagitis: Secondary | ICD-10-CM | POA: Diagnosis present

## 2016-10-26 DIAGNOSIS — Z87891 Personal history of nicotine dependence: Secondary | ICD-10-CM | POA: Diagnosis not present

## 2016-10-26 DIAGNOSIS — Z8507 Personal history of malignant neoplasm of pancreas: Secondary | ICD-10-CM | POA: Diagnosis not present

## 2016-10-26 DIAGNOSIS — D649 Anemia, unspecified: Secondary | ICD-10-CM | POA: Diagnosis present

## 2016-10-26 DIAGNOSIS — Z8673 Personal history of transient ischemic attack (TIA), and cerebral infarction without residual deficits: Secondary | ICD-10-CM | POA: Diagnosis not present

## 2016-10-29 DIAGNOSIS — N390 Urinary tract infection, site not specified: Secondary | ICD-10-CM | POA: Diagnosis not present

## 2016-10-30 DIAGNOSIS — N390 Urinary tract infection, site not specified: Secondary | ICD-10-CM | POA: Diagnosis not present

## 2016-10-31 DIAGNOSIS — N390 Urinary tract infection, site not specified: Secondary | ICD-10-CM | POA: Diagnosis not present

## 2016-11-01 DIAGNOSIS — N390 Urinary tract infection, site not specified: Secondary | ICD-10-CM | POA: Diagnosis not present

## 2016-11-02 DIAGNOSIS — N390 Urinary tract infection, site not specified: Secondary | ICD-10-CM | POA: Diagnosis not present

## 2016-11-03 DIAGNOSIS — N390 Urinary tract infection, site not specified: Secondary | ICD-10-CM | POA: Diagnosis not present

## 2016-11-04 DIAGNOSIS — N390 Urinary tract infection, site not specified: Secondary | ICD-10-CM | POA: Diagnosis not present

## 2016-11-05 DIAGNOSIS — N390 Urinary tract infection, site not specified: Secondary | ICD-10-CM | POA: Diagnosis not present

## 2016-11-06 DIAGNOSIS — N390 Urinary tract infection, site not specified: Secondary | ICD-10-CM | POA: Diagnosis not present

## 2016-11-18 DIAGNOSIS — K8681 Exocrine pancreatic insufficiency: Secondary | ICD-10-CM | POA: Diagnosis not present

## 2016-11-18 DIAGNOSIS — F331 Major depressive disorder, recurrent, moderate: Secondary | ICD-10-CM | POA: Diagnosis not present

## 2016-11-18 DIAGNOSIS — I10 Essential (primary) hypertension: Secondary | ICD-10-CM | POA: Diagnosis not present

## 2016-11-18 DIAGNOSIS — E876 Hypokalemia: Secondary | ICD-10-CM | POA: Diagnosis not present

## 2016-11-18 DIAGNOSIS — M1612 Unilateral primary osteoarthritis, left hip: Secondary | ICD-10-CM | POA: Diagnosis not present

## 2016-11-18 DIAGNOSIS — I82891 Chronic embolism and thrombosis of other specified veins: Secondary | ICD-10-CM | POA: Diagnosis not present

## 2016-11-18 DIAGNOSIS — Z681 Body mass index (BMI) 19 or less, adult: Secondary | ICD-10-CM | POA: Diagnosis not present

## 2016-11-18 DIAGNOSIS — K219 Gastro-esophageal reflux disease without esophagitis: Secondary | ICD-10-CM | POA: Diagnosis not present

## 2016-12-08 DIAGNOSIS — Z23 Encounter for immunization: Secondary | ICD-10-CM | POA: Diagnosis not present

## 2016-12-23 NOTE — Progress Notes (Signed)
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 Results of the tumor marker test revealed 86.      05/24/2013 Tumor Marker    Patient's tumor was tested for the following markers: CEA. Results of the tumor marker test revealed 5.      06/18/2013 Procedure    Malignant distal CBD stricture in distal 2-3cm of CBD- 10 x 45mm covered metal stent placed         06/21/2013 Procedure    Diagnostic laparoscopy, open pylorus preserving pancreaticoduodenectomy, omentectomy by Dr. Maggie Font ([place])        06/26/2013 Cancer Staging    [id number]      06/26/2013 Pathology Results    Adenocarcinoma of distal common bile duct in the head of pancreas, 1.2 cm, moderately differentiated, 1 mitosis/10 HPF, negative margines, perineural invasion identified, peripancreatic soft tissue invasion seen, tumor involves pancreatic acinar tissue and submucosa of duodenum near ampulla, 2/7 positive nodes      01/06/2016 Imaging    CT abd/pelvis- 1. Patient status post Whipple procedure without evidence of pancreatic carcinoma recurrence or metastasis. 2. No surgical complication identified. 3. Bilateral pars defects with grade 1 anterolisthesis at L5-S1.      06/21/2016 Imaging    Ct abd/pelvis- Stable postop changes from Whipple procedure. No evidence of recurrent or metastatic carcinoma within abdomen.  Incidentally noted aortic atherosclerosis and bilateral L5 pars defects, with degenerative disc disease and grade 2 anterolisthesis at L5-S1        INTERVAL HISTORY:  Maureen Vaughn 66 y.o.  female returns for routine follow-up for history of pancreatic cancer.   Here today with her family.  Overall, she tells me she has been doing "pretty good."  Appetite 100%; energy levels 50%.  She feels fatigued at times.  Denies any new abdominal pain or N&V.  Continues to have diarrhea, "but it's better."  She has increased the amount of Creon she is taking, which has been helpful.    She has chronic pain to her hips, back, and legs. Continues to ambulate with a cane. She has occasional dizziness, which has been longstanding and is no worse. Denies any falls.    Shares with me that she was recently hospitalized in [place] at [facility].  "I went in with abdominal pain and they told me I had a blood clot next to my spleen."  She was started on Xarelto and remains on anticoagulation.  She feels like she is tolerating the medication well.  A physician in [place] is managing the Xarelto; she cannot recall the doctor's name.  Denies any bleeding episodes including blood in her stools, hematuria, vaginal bleeding, nosebleeds, or gingival bleeding.  She was also reportedly found to have a UTI; her family explains "she had to come back to the hospital back and forth for 9 days to take antibiotics intravenously."    She continues to report urinary frequency today. Denies any dysuria. Denies fever/chills. States that her urine was never re-checked after she completed antibiotics.  I do not have records available for review re: recent  hospitalization. Family thinks this hospitalization was in September they think.      REVIEW OF SYSTEMS:  Review of Systems  Constitutional: Positive for fatigue. Negative for appetite change, chills and fever.  HENT:  Negative.   Eyes: Negative.   Respiratory: Negative.   Cardiovascular: Negative.   Gastrointestinal: Positive for diarrhea. Negative for abdominal pain, nausea and vomiting.  Endocrine: Negative.   Genitourinary: Positive for frequency. Negative for  dysuria and hematuria.   Musculoskeletal: Positive for arthralgias.  Neurological: Positive for dizziness. Negative for headaches.  Hematological: Bruises/bleeds easily.  Psychiatric/Behavioral: Negative.      PAST MEDICAL/SURGICAL HISTORY:  Past Medical History:  Diagnosis Date  . Adenocarcinoma of pancreas ([place]) 12/23/2015  . Cancer [facility])    pancreatic  . Chronic abdominal pain   . Chronic diarrhea   . Depression   . Falls frequently   . Fracture of right humerus   . Hip fracture, right ([place])   . Hypotension    Past Surgical History:  Procedure Laterality Date  . GASTROSTOMY TUBE PLACEMENT       SOCIAL HISTORY:  Social History   Socioeconomic History  . Marital status: Legally Separated    Spouse name: Not on file  . Number of children: Not on file  . Years of education: Not on file  . Highest education level: Not on file  Social Needs  . Financial resource strain: Not on file  . Food insecurity - worry: Not on file  . Food insecurity - inability: Not on file  . Transportation needs - medical: Not on file  . Transportation needs - non-medical: Not on file  Occupational History  . Not on file  Tobacco Use  . Smoking status: Former Smoker    Packs/day: 2.00    Years: 35.00    Pack years: 70.00    Last attempt to quit: 12/23/2010    Years since quitting: 6.0  . Smokeless tobacco: Never Used  Substance and Sexual Activity  . 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 Drug use: No  . Sexual activity: Not on file  Other Topics Concern  . Not on file  Social History Narrative  . Not on file    FAMILY HISTORY:  Family History  Adopted: Yes    CURRENT MEDICATIONS:  Outpatient Encounter Medications as of 12/24/2016  Medication Sig Note  . diazepam (VALIUM) 5 MG tablet Take by mouth. 12/23/2015: Received from: [place]: Take 1 tablet by mouth 3 (three) times a day as needed.  . DULoxetine  (CYMBALTA) 60 MG capsule Take 60 mg by mouth daily.   Marland Kitchen FLUoxetine (PROZAC) 20 MG capsule Take by mouth.   . ibandronate (BONIVA) 150 MG tablet Take by mouth. 12/23/2015: Received from: [place]: Take 150 mg by mouth every 30 (thirty) days. Take one 150 mg tablet monthly on the same day each month. Swallow whole tablet with 6-8 oz of plain water only. Don't eat, drink (except for water), or take oth  . lipase/protease/amylase (CREON) 36000 UNITS CPEP capsule Take 1 capsule (36,000 Units total) by mouth 3 (three) times daily with meals.   . metoprolol succinate (TOPROL-XL) 25 MG 24 hr tablet Take by mouth.   Marland Kitchen omeprazole (PRILOSEC) 40 MG capsule Take 40 mg by mouth daily. 12/23/2015: Received from: External Pharmacy Received Sig: TAKE ONE CAPSULE BY MOUTH DAILY  . ondansetron (ZOFRAN) 4 MG tablet Take 4  mg by mouth every 8 (eight) hours as needed. 12/23/2015: Received from: External Pharmacy Received Sig: TAKE ONE TABLET BY MOUTH EVERY 8 HOURS AS NEEDED  . potassium chloride (K-DUR,KLOR-CON) 10 MEQ tablet Take by mouth. 12/23/2015: Received from: [place]: Take 1 tablet by mouth daily.  . traMADol (ULTRAM) 50 MG tablet TAKE TWO (2) TABLETS BY MOUTH FOUR TIMES DAILY AS NEEDED FOR PAIN   . XARELTO 20 MG TABS tablet Take 20 mg daily by mouth.   . [DISCONTINUED] diazepam (VALIUM) 5 MG tablet Take by mouth.   . [DISCONTINUED] lipase/protease/amylase (CREON) 36000 UNITS CPEP capsule Take by mouth.   . [DISCONTINUED] potassium chloride (K-DUR,KLOR-CON) 10 MEQ tablet Take by mouth.   . [DISCONTINUED] FLUoxetine HCl (PROZAC PO) Take by mouth daily.   . [DISCONTINUED] ibandronate (BONIVA) 150 MG tablet Take by mouth.   . [DISCONTINUED] metoprolol succinate (TOPROL-XL) 25 MG 24 hr tablet Take by mouth. 12/23/2015: Received from: [place]: Take 12.5 mg by mouth daily. Takes 1/2 tablet daily  . [DISCONTINUED] Oxycodone HCl 10 MG TABS 10 mg by Gastric Tube route  every 6 (six) hours as needed (for pain).    No facility-administered encounter medications on file as of 12/24/2016.     ALLERGIES:  No Known Allergies   PHYSICAL EXAM:  ECOG Performance status: 1 - 2 - Symptomatic; requires occasional assistance.   Vitals:   12/24/16 1422  BP: (!) 151/64  Pulse: 70  Resp: 16  SpO2: 100%   Filed Weights   12/24/16 1422  Weight: 119 lb 8 oz (54.2 kg)    Physical Exam  Constitutional: She is oriented to person, place, and time.  Chronically-ill appearing female in no acute distress -Requires assistance to get onto exam table.   HENT:  Head: Normocephalic.  Mouth/Throat: Oropharynx is clear and moist. No oropharyngeal exudate.  Eyes: Conjunctivae are normal. Pupils are equal, round, and reactive to light. No scleral icterus.  Neck: Normal range of motion. Neck supple.  Cardiovascular: Normal rate and regular rhythm.  Pulmonary/Chest: Effort normal and breath sounds normal. No respiratory distress. She has no wheezes.  Abdominal: Soft. Bowel sounds are normal. There is no tenderness. There is no rebound.  Musculoskeletal: Normal range of motion. She exhibits no edema.  Lymphadenopathy:    She has no cervical adenopathy.  Neurological: She is alert and oriented to person, place, and time. No cranial nerve deficit.  Ambulates with cane; right leg is weaker than left (s/p hip replacement surgery in the past and chronic pain)  Skin: Skin is warm and dry. No rash noted.  Psychiatric: Mood, memory, affect and judgment normal.  Nursing note and vitals reviewed.    LABORATORY DATA:  I have reviewed the labs as listed.  CBC    Component Value Date/Time   WBC 8.0 12/24/2016 1252   RBC 4.16 12/24/2016 1252   HGB 11.4 (L) 12/24/2016 1252   HCT 36.3 12/24/2016 1252   PLT 248 12/24/2016 1252   MCV 87.3 12/24/2016 1252   MCH 27.4 12/24/2016 1252   MCHC 31.4 12/24/2016 1252   RDW 16.2 (H) 12/24/2016 1252   LYMPHSABS 2.4 12/24/2016 1252    MONOABS 0.5 12/24/2016 1252   EOSABS 0.3 12/24/2016 1252   BASOSABS 0.0 12/24/2016 1252   CMP Latest Ref Rng & Units 12/24/2016 06/17/2016 12/23/2015  Glucose 65 - 99 mg/dL 87 105(H) 101(H)  BUN 6 - 20 mg/dL 14 15 13   Creatinine 0.44 - 1.00 mg/dL 0.86 0.95  0.94  Sodium 135 - 145 mmol/L 136 138 137  Potassium 3.5 - 5.1 mmol/L 4.1 4.5 4.2  Chloride 101 - 111 mmol/L 105 108 105  CO2 22 - 32 mmol/L 24 22 25   Calcium 8.9 - 10.3 mg/dL 9.0 8.6(L) 9.2  Total Protein 6.5 - 8.1 g/dL 7.5 6.9 7.4  Total Bilirubin 0.3 - 1.2 mg/dL 0.6 0.4 0.6  Alkaline Phos 38 - 126 U/L 133(H) 79 119  AST 15 - 41 U/L 27 22 29   ALT 14 - 54 U/L 19 15 21    Results for BRITNAY, MAGNUSSEN (MRN [id number])   Ref. Range 06/17/2016 12:45  CA 19-9 Latest Ref Range: 0 - 35 U/mL 12  CEA Latest Ref Range: 0.0 - 4.7 ng/mL 3.2    PENDING LABS:    DIAGNOSTIC IMAGING:  *The following radiologic images and reports have been reviewed independently and agree with below findings.  CT abd: 06/21/16      PATHOLOGY:  Whipple surgical path: 06/21/13 (done at [facility])          ASSESSMENT & PLAN:   Stage IIB (T3N1M0) adenocarcinoma of pancreas:  -Diagnosed in 06/2013. CEA & CA 19-9 elevated pre-operatively. Treated with Whipple procedure by Dr. Maggie Font at [facility].  Her post-op course was complicated by failure to thrive/weight loss, as well as multiple medical issues including hip fracture requiring SNF/rehab.  placement, and required PEG tube placement for malnutrition. Therefore, adjuvant chemotherapy was not given. CEA & CA 19-9 tumor markers normalized.  -CEA & CA 19-9 tumor markers have remained normal over time. Results are pending for today. -Last CT abd in 06/2016 negative for recurrent disease.  Will obtain annual CT abd/pelvis in 06/2017; orders placed today.  Will plan to continue to image annually in 2019 and 2020, which will total 5 years since her diagnosis/treatment.  We will maintain follow-up visits  every 6 months for continued surveillance.   Of course, if she has any new/worsening symptoms, we can see her sooner and obtain additional imaging as clinically indicated.  She agrees with this plan. -Return to cancer center in 6 months for follow-up with labs.       Urinary frequency:  -Possibly recurrent or residual UTI s/p recent hospitalization requiring IV antibiotics.  Will obtain repeat clean catch UA and urine culture today to re-evaluate given her persistent urinary frequency.  -Records obtained from [facility] for recent hospitalization after patient left clinic today.  Urine culture noted to be positive for >100,000 gram negative rods, E.coli, Extended-spectrum beta lactamase positive).  The following sensitivities noted on urine culture dated 10/26/16:   *Resistant: Ceftriazone, Cipro, Bactrim, Ampicillin, Cefazolin, Cefepime, Amp/Sulbac, Aztreonam.   *Sensitive: Nitrofurantoin, Zosyn, Gentamicin, Ertapenem, and Meropenem.   -Appears she was treated as an outpatient with Ertapenem for 10 total doses (received 1 dose as an inpatient and subsequent doses for 9 days as an outpatient).  -If she has either recurrent or persistent UTI, we will prescribe appropriate anti-microbial regimen.  We will contact her with results when they are available.    Newly diagnosed splenic vein thrombosis:  -Diagnosed during recent hospitalization at [facility]. Hospitalization records reviewed. She was hospitalized from 10/26/16-10/28/16 with abd pain. CT abd/pelvis done on 10/26/16 revealed occlusive thrombus throughout splenic vein and nearly occlusive thrombus at portal vein confluence; no reported evidence of recurrent malignancy on CT.  -Treated with 1 dose of Lovenox as inpatient and was transitioned to oral Xarelto. Remains on Xarelto.  -Being managed by outside provider in [place];  we defer to their management of her anticoagulation.     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  -Improved with increase in Creon. Encouraged continued compliance and use of Creon.          Dispo:  -UA/urine culture today given urinary frequency.  -CT abd/pelvis due in 06/2017; orders placed today.  -Return to cancer center in 6 months for continued surveillance.    All questions were answered to patient's stated satisfaction. Encouraged patient to call with any new concerns or questions before her next visit to the cancer center and we can certain see her sooner, if needed.    Plan of care discussed with Dr. Talbert Cage, who agrees with the above aforementioned.    Orders placed this encounter:  Orders Placed This Encounter  Procedures  . Urine culture  . CT Abdomen Pelvis W Contrast  . Urinalysis, Routine w reflex microscopic      Mike Craze, NP [place] [phone]

## 2016-12-24 ENCOUNTER — Other Ambulatory Visit (HOSPITAL_COMMUNITY): Payer: Medicare Other

## 2016-12-24 ENCOUNTER — Other Ambulatory Visit: Payer: Self-pay

## 2016-12-24 ENCOUNTER — Encounter (HOSPITAL_BASED_OUTPATIENT_CLINIC_OR_DEPARTMENT_OTHER): Payer: Medicare Other | Admitting: Adult Health

## 2016-12-24 ENCOUNTER — Encounter (HOSPITAL_COMMUNITY): Payer: Medicare Other | Attending: Oncology

## 2016-12-24 ENCOUNTER — Encounter (HOSPITAL_COMMUNITY): Payer: Self-pay | Admitting: Adult Health

## 2016-12-24 ENCOUNTER — Ambulatory Visit (HOSPITAL_COMMUNITY): Payer: Medicare Other | Admitting: Adult Health

## 2016-12-24 VITALS — BP 151/64 | HR 70 | Resp 16 | Ht 60.0 in | Wt 119.5 lb

## 2016-12-24 DIAGNOSIS — Z7901 Long term (current) use of anticoagulants: Secondary | ICD-10-CM | POA: Insufficient documentation

## 2016-12-24 DIAGNOSIS — R197 Diarrhea, unspecified: Secondary | ICD-10-CM | POA: Diagnosis not present

## 2016-12-24 DIAGNOSIS — G8929 Other chronic pain: Secondary | ICD-10-CM | POA: Diagnosis not present

## 2016-12-24 DIAGNOSIS — R350 Frequency of micturition: Secondary | ICD-10-CM

## 2016-12-24 DIAGNOSIS — R42 Dizziness and giddiness: Secondary | ICD-10-CM

## 2016-12-24 DIAGNOSIS — C259 Malignant neoplasm of pancreas, unspecified: Secondary | ICD-10-CM | POA: Diagnosis not present

## 2016-12-24 DIAGNOSIS — Z87891 Personal history of nicotine dependence: Secondary | ICD-10-CM | POA: Insufficient documentation

## 2016-12-24 DIAGNOSIS — I82890 Acute embolism and thrombosis of other specified veins: Secondary | ICD-10-CM | POA: Diagnosis not present

## 2016-12-24 DIAGNOSIS — Z08 Encounter for follow-up examination after completed treatment for malignant neoplasm: Secondary | ICD-10-CM | POA: Insufficient documentation

## 2016-12-24 DIAGNOSIS — F329 Major depressive disorder, single episode, unspecified: Secondary | ICD-10-CM | POA: Diagnosis not present

## 2016-12-24 DIAGNOSIS — Z8507 Personal history of malignant neoplasm of pancreas: Secondary | ICD-10-CM | POA: Insufficient documentation

## 2016-12-24 LAB — CBC WITH DIFFERENTIAL/PLATELET
Basophils Absolute: 0 10*3/uL (ref 0.0–0.1)
Basophils Relative: 0 %
EOS ABS: 0.3 10*3/uL (ref 0.0–0.7)
Eosinophils Relative: 3 %
HEMATOCRIT: 36.3 % (ref 36.0–46.0)
HEMOGLOBIN: 11.4 g/dL — AB (ref 12.0–15.0)
LYMPHS ABS: 2.4 10*3/uL (ref 0.7–4.0)
LYMPHS PCT: 30 %
MCH: 27.4 pg (ref 26.0–34.0)
MCHC: 31.4 g/dL (ref 30.0–36.0)
MCV: 87.3 fL (ref 78.0–100.0)
MONOS PCT: 6 %
Monocytes Absolute: 0.5 10*3/uL (ref 0.1–1.0)
NEUTROS ABS: 4.8 10*3/uL (ref 1.7–7.7)
NEUTROS PCT: 61 %
Platelets: 248 10*3/uL (ref 150–400)
RBC: 4.16 MIL/uL (ref 3.87–5.11)
RDW: 16.2 % — ABNORMAL HIGH (ref 11.5–15.5)
WBC: 8 10*3/uL (ref 4.0–10.5)

## 2016-12-24 LAB — URINALYSIS, ROUTINE W REFLEX MICROSCOPIC
BILIRUBIN URINE: NEGATIVE
GLUCOSE, UA: NEGATIVE mg/dL
KETONES UR: NEGATIVE mg/dL
LEUKOCYTES UA: NEGATIVE
NITRITE: NEGATIVE
PH: 5 (ref 5.0–8.0)
Protein, ur: NEGATIVE mg/dL
Specific Gravity, Urine: 1.006 (ref 1.005–1.030)
Squamous Epithelial / LPF: NONE SEEN

## 2016-12-24 LAB — COMPREHENSIVE METABOLIC PANEL
ALK PHOS: 133 U/L — AB (ref 38–126)
ALT: 19 U/L (ref 14–54)
ANION GAP: 7 (ref 5–15)
AST: 27 U/L (ref 15–41)
Albumin: 3.7 g/dL (ref 3.5–5.0)
BILIRUBIN TOTAL: 0.6 mg/dL (ref 0.3–1.2)
BUN: 14 mg/dL (ref 6–20)
CALCIUM: 9 mg/dL (ref 8.9–10.3)
CO2: 24 mmol/L (ref 22–32)
CREATININE: 0.86 mg/dL (ref 0.44–1.00)
Chloride: 105 mmol/L (ref 101–111)
Glucose, Bld: 87 mg/dL (ref 65–99)
Potassium: 4.1 mmol/L (ref 3.5–5.1)
Sodium: 136 mmol/L (ref 135–145)
TOTAL PROTEIN: 7.5 g/dL (ref 6.5–8.1)

## 2016-12-24 NOTE — Patient Instructions (Signed)
[place] at [facility] Discharge Instructions  RECOMMENDATIONS MADE BY THE CONSULTANT AND ANY TEST RESULTS WILL BE SENT TO YOUR REFERRING PHYSICIAN.  You were seen today by Mike Craze NP. Urine sent to lab today, we will call with those results. Return in 6 months for CT, labs and follow up.   Thank you for choosing [place] at [facility] to provide your oncology and hematology care.  To afford each patient quality time with our provider, please arrive at least 15 minutes before your scheduled appointment time.    If you have a lab appointment with the [facility] please come in thru the  Main Entrance and check in at the main information desk  You need to re-schedule your appointment should you arrive 10 or more minutes late.  We strive to give you quality time with our providers, and arriving late affects you and other patients whose appointments are after yours.  Also, if you no show three or more times for appointments you may be dismissed from the clinic at the providers discretion.     Again, thank you for choosing [facility].  Our hope is that these requests will decrease the amount of time that you wait before being seen by our physicians.       _____________________________________________________________  Should you have questions after your visit to [facility], please contact our office at [phone] between the hours of 8:30 a.m. and 4:30 p.m.  Voicemails left after 4:30 p.m. will not be returned until the following business day.  For prescription refill requests, have your pharmacy contact our office.       Resources For Cancer Patients and their Caregivers ? American Cancer Society: Can assist with transportation, wigs, general needs, runs Look Good Feel Better.        548-242-2631 ? Cancer Care: Provides financial assistance, online support groups, medication/co-pay assistance.   1-800-813-HOPE 380-353-0192) ? [place] Assists [place] cancer patients and their families through emotional , educational and financial support.  920-034-1903 ? [place] DSS Where to apply for food stamps, Medicaid and utility assistance. (660) 663-4523 ? [facility]: Transportation to medical appointments. (629) 243-7574 ? Social Security Administration: May apply for disability if have a Stage IV cancer. 630-310-8190 7264742882 ? [employer], Disability and Transit Services: Assists with nutrition, care and transit needs. Lupus Support Programs: @10RELATIVEDAYS @ > Cancer Support Group  2nd Tuesday of the month 1pm-2pm, Journey Room  > Creative Journey  3rd Tuesday of the month 1130am-1pm, Journey Room  > Look Good Feel Better  1st Wednesday of the month 10am-12 noon, Journey Room (Call [place] to register [phone])

## 2016-12-25 LAB — CANCER ANTIGEN 19-9: CAN 19-9: 14 U/mL (ref 0–35)

## 2016-12-25 LAB — CEA: CEA1: 2.2 ng/mL (ref 0.0–4.7)

## 2016-12-27 ENCOUNTER — Other Ambulatory Visit (HOSPITAL_COMMUNITY): Payer: Self-pay | Admitting: Adult Health

## 2016-12-27 DIAGNOSIS — N390 Urinary tract infection, site not specified: Principal | ICD-10-CM

## 2016-12-27 DIAGNOSIS — B952 Enterococcus as the cause of diseases classified elsewhere: Secondary | ICD-10-CM

## 2016-12-27 LAB — URINE CULTURE

## 2016-12-27 MED ORDER — NITROFURANTOIN MONOHYD MACRO 100 MG PO CAPS: 100.0000 mg | ORAL_CAPSULE | Freq: Two times a day (BID) | ORAL | 0 refills | Status: DC

## 2017-02-22 DIAGNOSIS — I10 Essential (primary) hypertension: Secondary | ICD-10-CM | POA: Diagnosis not present

## 2017-02-22 DIAGNOSIS — M1611 Unilateral primary osteoarthritis, right hip: Secondary | ICD-10-CM | POA: Diagnosis not present

## 2017-02-22 DIAGNOSIS — K219 Gastro-esophageal reflux disease without esophagitis: Secondary | ICD-10-CM | POA: Diagnosis not present

## 2017-02-22 DIAGNOSIS — Z6823 Body mass index (BMI) 23.0-23.9, adult: Secondary | ICD-10-CM | POA: Diagnosis not present

## 2017-02-22 DIAGNOSIS — E876 Hypokalemia: Secondary | ICD-10-CM | POA: Diagnosis not present

## 2017-02-22 DIAGNOSIS — K8681 Exocrine pancreatic insufficiency: Secondary | ICD-10-CM | POA: Diagnosis not present

## 2017-02-22 DIAGNOSIS — Z23 Encounter for immunization: Secondary | ICD-10-CM | POA: Diagnosis not present

## 2017-02-22 DIAGNOSIS — I82891 Chronic embolism and thrombosis of other specified veins: Secondary | ICD-10-CM | POA: Diagnosis not present

## 2017-02-22 DIAGNOSIS — Z9189 Other specified personal risk factors, not elsewhere classified: Secondary | ICD-10-CM | POA: Diagnosis not present

## 2017-02-22 DIAGNOSIS — F331 Major depressive disorder, recurrent, moderate: Secondary | ICD-10-CM | POA: Diagnosis not present

## 2017-02-22 DIAGNOSIS — M1612 Unilateral primary osteoarthritis, left hip: Secondary | ICD-10-CM | POA: Diagnosis not present

## 2017-03-17 DIAGNOSIS — I10 Essential (primary) hypertension: Secondary | ICD-10-CM | POA: Diagnosis not present

## 2017-03-17 DIAGNOSIS — Z9189 Other specified personal risk factors, not elsewhere classified: Secondary | ICD-10-CM | POA: Diagnosis not present

## 2017-03-17 DIAGNOSIS — I82891 Chronic embolism and thrombosis of other specified veins: Secondary | ICD-10-CM | POA: Diagnosis not present

## 2017-03-17 DIAGNOSIS — E876 Hypokalemia: Secondary | ICD-10-CM | POA: Diagnosis not present

## 2017-03-17 DIAGNOSIS — K219 Gastro-esophageal reflux disease without esophagitis: Secondary | ICD-10-CM | POA: Diagnosis not present

## 2017-03-28 DIAGNOSIS — M1612 Unilateral primary osteoarthritis, left hip: Secondary | ICD-10-CM | POA: Diagnosis not present

## 2017-03-28 DIAGNOSIS — I82891 Chronic embolism and thrombosis of other specified veins: Secondary | ICD-10-CM | POA: Diagnosis not present

## 2017-03-28 DIAGNOSIS — E876 Hypokalemia: Secondary | ICD-10-CM | POA: Diagnosis not present

## 2017-03-28 DIAGNOSIS — Z6823 Body mass index (BMI) 23.0-23.9, adult: Secondary | ICD-10-CM | POA: Diagnosis not present

## 2017-03-28 DIAGNOSIS — K8681 Exocrine pancreatic insufficiency: Secondary | ICD-10-CM | POA: Diagnosis not present

## 2017-03-28 DIAGNOSIS — M1611 Unilateral primary osteoarthritis, right hip: Secondary | ICD-10-CM | POA: Diagnosis not present

## 2017-03-28 DIAGNOSIS — K219 Gastro-esophageal reflux disease without esophagitis: Secondary | ICD-10-CM | POA: Diagnosis not present

## 2017-03-28 DIAGNOSIS — I10 Essential (primary) hypertension: Secondary | ICD-10-CM | POA: Diagnosis not present

## 2017-06-18 DIAGNOSIS — S0003XA Contusion of scalp, initial encounter: Secondary | ICD-10-CM | POA: Diagnosis not present

## 2017-06-18 DIAGNOSIS — Z8673 Personal history of transient ischemic attack (TIA), and cerebral infarction without residual deficits: Secondary | ICD-10-CM | POA: Diagnosis not present

## 2017-06-18 DIAGNOSIS — S0990XA Unspecified injury of head, initial encounter: Secondary | ICD-10-CM | POA: Diagnosis not present

## 2017-06-18 DIAGNOSIS — R918 Other nonspecific abnormal finding of lung field: Secondary | ICD-10-CM | POA: Diagnosis not present

## 2017-06-18 DIAGNOSIS — M79631 Pain in right forearm: Secondary | ICD-10-CM | POA: Diagnosis not present

## 2017-06-18 DIAGNOSIS — M25521 Pain in right elbow: Secondary | ICD-10-CM | POA: Diagnosis not present

## 2017-06-18 DIAGNOSIS — Z96641 Presence of right artificial hip joint: Secondary | ICD-10-CM | POA: Diagnosis not present

## 2017-06-18 DIAGNOSIS — Z471 Aftercare following joint replacement surgery: Secondary | ICD-10-CM | POA: Diagnosis not present

## 2017-06-21 ENCOUNTER — Other Ambulatory Visit (HOSPITAL_COMMUNITY): Payer: Self-pay | Admitting: *Deleted

## 2017-06-21 DIAGNOSIS — C259 Malignant neoplasm of pancreas, unspecified: Secondary | ICD-10-CM

## 2017-06-22 ENCOUNTER — Ambulatory Visit (HOSPITAL_COMMUNITY)
Admission: RE | Admit: 2017-06-22 | Discharge: 2017-06-22 | Disposition: A | Discharge status: Home / Self Care | Payer: Medicare Other | Source: Ambulatory Visit | Attending: Adult Health | Admitting: Adult Health

## 2017-06-22 ENCOUNTER — Inpatient Hospital Stay (HOSPITAL_COMMUNITY): Payer: Medicare Other | Attending: Hematology

## 2017-06-22 DIAGNOSIS — R197 Diarrhea, unspecified: Secondary | ICD-10-CM | POA: Insufficient documentation

## 2017-06-22 DIAGNOSIS — Z90411 Acquired partial absence of pancreas: Secondary | ICD-10-CM | POA: Insufficient documentation

## 2017-06-22 DIAGNOSIS — D649 Anemia, unspecified: Secondary | ICD-10-CM | POA: Diagnosis not present

## 2017-06-22 DIAGNOSIS — R978 Other abnormal tumor markers: Secondary | ICD-10-CM | POA: Insufficient documentation

## 2017-06-22 DIAGNOSIS — C250 Malignant neoplasm of head of pancreas: Secondary | ICD-10-CM | POA: Diagnosis not present

## 2017-06-22 DIAGNOSIS — C259 Malignant neoplasm of pancreas, unspecified: Secondary | ICD-10-CM

## 2017-06-22 DIAGNOSIS — R110 Nausea: Secondary | ICD-10-CM | POA: Diagnosis not present

## 2017-06-22 DIAGNOSIS — Z79899 Other long term (current) drug therapy: Secondary | ICD-10-CM | POA: Diagnosis not present

## 2017-06-22 LAB — COMPREHENSIVE METABOLIC PANEL
ALBUMIN: 3.8 g/dL (ref 3.5–5.0)
ALT: 16 U/L (ref 14–54)
ANION GAP: 8 (ref 5–15)
AST: 24 U/L (ref 15–41)
Alkaline Phosphatase: 89 U/L (ref 38–126)
BUN: 17 mg/dL (ref 6–20)
CHLORIDE: 99 mmol/L — AB (ref 101–111)
CO2: 24 mmol/L (ref 22–32)
Calcium: 8.5 mg/dL — ABNORMAL LOW (ref 8.9–10.3)
Creatinine, Ser: 0.86 mg/dL (ref 0.44–1.00)
GFR calc Af Amer: >60 mL/min (ref >60)
GFR calc non Af Amer: >60 mL/min (ref >60)
GLUCOSE: 92 mg/dL (ref 65–99)
POTASSIUM: 4.4 mmol/L (ref 3.5–5.1)
Sodium: 131 mmol/L — ABNORMAL LOW (ref 135–145)
Total Bilirubin: 0.7 mg/dL (ref 0.3–1.2)
Total Protein: 7.2 g/dL (ref 6.5–8.1)

## 2017-06-22 LAB — CBC WITH DIFFERENTIAL/PLATELET
BASOS ABS: 0 10*3/uL (ref 0.0–0.1)
BASOS PCT: 0 %
EOS ABS: 0.4 10*3/uL (ref 0.0–0.7)
Eosinophils Relative: 5 %
HEMATOCRIT: 33.4 % — AB (ref 36.0–46.0)
Hemoglobin: 10.3 g/dL — ABNORMAL LOW (ref 12.0–15.0)
LYMPHS ABS: 2.5 10*3/uL (ref 0.7–4.0)
Lymphocytes Relative: 32 %
MCH: 25.9 pg — ABNORMAL LOW (ref 26.0–34.0)
MCHC: 30.8 g/dL (ref 30.0–36.0)
MCV: 83.9 fL (ref 78.0–100.0)
MONO ABS: 0.5 10*3/uL (ref 0.1–1.0)
MONOS PCT: 6 %
Neutro Abs: 4.5 10*3/uL (ref 1.7–7.7)
Neutrophils Relative %: 57 %
Platelets: 199 10*3/uL (ref 150–400)
RBC: 3.98 MIL/uL (ref 3.87–5.11)
RDW: 16.7 % — AB (ref 11.5–15.5)
WBC: 7.9 10*3/uL (ref 4.0–10.5)

## 2017-06-22 LAB — POCT I-STAT CREATININE: Creatinine, Ser: 1 mg/dL (ref 0.44–1.00)

## 2017-06-22 MED ORDER — IOPAMIDOL (ISOVUE-300) INJECTION 61%
100.0000 mL | Freq: Once | INTRAVENOUS | Status: AC | PRN
  Administered 2017-06-22: 100 mL via INTRAVENOUS

## 2017-06-23 LAB — CANCER ANTIGEN 19-9: CAN 19-9: 194 U/mL — AB (ref 0–35)

## 2017-06-23 LAB — CEA: CEA: 2.2 ng/mL (ref 0.0–4.7)

## 2017-06-24 ENCOUNTER — Inpatient Hospital Stay (HOSPITAL_BASED_OUTPATIENT_CLINIC_OR_DEPARTMENT_OTHER): Payer: Medicare Other | Admitting: Hematology

## 2017-06-24 ENCOUNTER — Encounter (HOSPITAL_COMMUNITY): Payer: Self-pay | Admitting: Hematology

## 2017-06-24 ENCOUNTER — Other Ambulatory Visit: Payer: Self-pay

## 2017-06-24 ENCOUNTER — Inpatient Hospital Stay (HOSPITAL_COMMUNITY): Payer: Medicare Other

## 2017-06-24 VITALS — BP 138/77 | HR 61 | Temp 98.5°F | Resp 18 | Wt 123.4 lb

## 2017-06-24 DIAGNOSIS — C250 Malignant neoplasm of head of pancreas: Secondary | ICD-10-CM | POA: Diagnosis not present

## 2017-06-24 DIAGNOSIS — R978 Other abnormal tumor markers: Secondary | ICD-10-CM | POA: Diagnosis not present

## 2017-06-24 DIAGNOSIS — R197 Diarrhea, unspecified: Secondary | ICD-10-CM

## 2017-06-24 DIAGNOSIS — D649 Anemia, unspecified: Secondary | ICD-10-CM | POA: Diagnosis not present

## 2017-06-24 DIAGNOSIS — C259 Malignant neoplasm of pancreas, unspecified: Secondary | ICD-10-CM

## 2017-06-24 DIAGNOSIS — Z79899 Other long term (current) drug therapy: Secondary | ICD-10-CM | POA: Diagnosis not present

## 2017-06-24 DIAGNOSIS — R110 Nausea: Secondary | ICD-10-CM | POA: Diagnosis not present

## 2017-06-24 LAB — IRON AND TIBC
IRON: 42 ug/dL (ref 28–170)
Saturation Ratios: 10 % — ABNORMAL LOW (ref 10.4–31.8)
TIBC: 413 ug/dL (ref 250–450)
UIBC: 371 ug/dL

## 2017-06-24 LAB — FOLATE: FOLATE: 9.3 ng/mL (ref >5.9)

## 2017-06-24 LAB — FERRITIN: Ferritin: 26 ng/mL (ref 11–307)

## 2017-06-24 LAB — VITAMIN B12: Vitamin B-12: 283 pg/mL (ref 180–914)

## 2017-06-24 NOTE — Assessment & Plan Note (Signed)
1.  Stage IIb (T3N1) pancreatic adenocarcinoma: -Status post Whipple surgery on 06/21/2013 at [facility] - Did not receive adjuvant chemotherapy due to complicated postoperative course. -I have reviewed the results of the CT scan of the abdomen and pelvis with contrast dated 06/21/2016 which shows post Whipple procedure changes without evidence of recurrence.  Very small volume of gas within the bladder. -CA 19-9 went up to 194, from a normal level 3 months before.  This is very concerning for recurrence.  I will order a PET CT scan to rule out occult metastatic disease.  She will be seen back after the PET scan.  2.  Normocytic anemia: -She denies any bleeding per rectum or melena.  Her hemoglobin dropped by one-point.  MCV also decreased.  We will check ferritin, iron panel, J09 and folic acid today.

## 2017-06-24 NOTE — Progress Notes (Signed)
Patient Care Team: Celedonio Savage, MD as PCP - General (Family Medicine)  DIAGNOSIS:  Encounter Diagnoses  Name Primary?  . Adenocarcinoma of pancreas ([place]) Yes  . Elevated CA 19-9 level   . Anemia, unspecified type     SUMMARY OF ONCOLOGIC HISTORY:   Adenocarcinoma of pancreas ([place])   05/23/2013 Tumor Marker    Patient's tumor was tested for the following markers: CA 19-9. Results of the tumor marker test revealed 86.      05/24/2013 Tumor Marker    Patient's tumor was tested for the following markers: CEA. Results of the tumor marker test revealed 5.      06/18/2013 Procedure    Malignant distal CBD stricture in distal 2-3cm of CBD- 10 x 85mm covered metal stent placed         06/21/2013 Procedure    Diagnostic laparoscopy, open pylorus preserving pancreaticoduodenectomy, omentectomy by Dr. Maggie Font ([place])        06/26/2013 Cancer Staging    [id number]      06/26/2013 Pathology Results    Adenocarcinoma of distal common bile duct in the head of pancreas, 1.2 cm, moderately differentiated, 1 mitosis/10 HPF, negative margines, perineural invasion identified, peripancreatic soft tissue invasion seen, tumor involves pancreatic acinar tissue and submucosa of duodenum near ampulla, 2/7 positive nodes      01/06/2016 Imaging    CT abd/pelvis- 1. Patient status post Whipple procedure without evidence of pancreatic carcinoma recurrence or metastasis. 2. No surgical complication identified. 3. Bilateral pars defects with grade 1 anterolisthesis at L5-S1.      06/21/2016 Imaging    Ct abd/pelvis- Stable postop changes from Whipple procedure. No evidence of recurrent or metastatic carcinoma within abdomen.  Incidentally noted aortic atherosclerosis and bilateral L5 pars defects, with degenerative disc disease and grade 2 anterolisthesis at L5-S1       CHIEF COMPLIANT: Stage IIB adenocarcinoma of pancreas  INTERVAL HISTORY: Maureen Vaughn is a 67 y.o. female here  for routine follow-up for pancreatic cancer.   Here today with her friend, who helps care for her.   Reports occasional N&V. She continues to have diarrhea regularly, but not daily (generally once per week). She states that she is taking her Creon TID.  She has chronic hip pain, which has been present since she had hip fracture after her Whipple surgery.  Denies any dysuria or hematuria.  Her friend shares that she has h/o UTI. Denies fevers.  She denies any recent catheterization.  She walks with a cane; reports that she slipped and fell recently and now has some bruising.    Recently had CT abd/pelvis on 06/22/17. These results were reviewed with her in detail today.  CT showed no obvious evidence of recurrent disease; there is evidence of small amount of gas in her bladder; she is asymptomatic from this.  Her CA 19-9 has increased to 194 on 06/22/17; last CA 19-9 was normal at 14 on 12/24/16.  This marked elevation in her tumor markers are concerning for recurrent disease; would like to obtain PET scan.    Hgb decreased a bit and is now 10.3 g/dL; suspect some element of iron deficiency.  Will collect anemia panel today.      REVIEW OF SYSTEMS:   Constitutional: Denies fevers, chills or abnormal weight loss.  Mild fatigue. Eyes: Denies blurriness of vision Ears, nose, mouth, throat, and face: Denies mucositis or sore throat Respiratory: Denies cough, dyspnea or wheezes Cardiovascular: Denies palpitation, chest discomfort Gastrointestinal:  Denies  nausea, heartburn or change in bowel habits.  Diarrhea present once per week. Skin: Denies abnormal skin rashes Lymphatics: Denies new lymphadenopathy or easy bruising Neurological:Denies numbness, tingling or new weaknesses Behavioral/Psych: Mood is stable, no new changes  Extremities: No lower extremity edema.  Continues to have right hip pain. All other systems were reviewed with the patient and are negative.  I have reviewed the past medical  history, past surgical history, social history and family history with the patient and they are unchanged from previous note.  ALLERGIES:  has No Known Allergies.  MEDICATIONS:  Current Outpatient Medications  Medication Sig Dispense Refill  . diazepam (VALIUM) 5 MG tablet Take by mouth.    . DULoxetine (CYMBALTA) 60 MG capsule Take 60 mg by mouth daily.  11  . ibandronate (BONIVA) 150 MG tablet Take by mouth.    . lipase/protease/amylase (CREON) 36000 UNITS CPEP capsule Take 1 capsule (36,000 Units total) by mouth 3 (three) times daily with meals. 90 capsule 6  . metoprolol succinate (TOPROL-XL) 25 MG 24 hr tablet Take by mouth.    . nitrofurantoin, macrocrystal-monohydrate, (MACROBID) 100 MG capsule Take 1 capsule (100 mg total) 2 (two) times daily by mouth. 20 capsule 0  . omeprazole (PRILOSEC) 40 MG capsule Take 40 mg by mouth daily.  5  . potassium chloride (K-DUR,KLOR-CON) 10 MEQ tablet Take by mouth.    . traMADol (ULTRAM) 50 MG tablet TAKE TWO (2) TABLETS BY MOUTH FOUR TIMES DAILY AS NEEDED FOR PAIN  2  . XARELTO 20 MG TABS tablet Take 20 mg daily by mouth.  11  . ondansetron (ZOFRAN) 4 MG tablet Take 4 mg by mouth every 8 (eight) hours as needed.  5   No current facility-administered medications for this visit.     PHYSICAL EXAMINATION: ECOG PERFORMANCE STATUS: 1 - Symptomatic but completely ambulatory  Vitals:   06/24/17 1424  BP: 138/77  Pulse: 61  Resp: 18  Temp: 98.5 F (36.9 C)  SpO2: 100%   Filed Weights   06/24/17 1424  Weight: 123 lb 6.4 oz (56 kg)    GENERAL:alert, no distress and comfortable SKIN: skin color, texture, turgor are normal, no rashes or significant lesions EYES: normal, Conjunctiva are pink and non-injected, sclera clear OROPHARYNX:no mucositis, no erythema and lips, buccal mucosa, and tongue normal  NECK: supple, thyroid normal size, non-tender, without nodularity LYMPH:  no palpable lymphadenopathy in the cervical, axillary or  inguinal LUNGS: clear to auscultation and percussion with normal breathing effort HEART: regular rate & rhythm and no murmurs and no lower extremity edema ABDOMEN:abdomen soft, non-tender and normal bowel sounds MUSCULOSKELETAL:no cyanosis of digits and no clubbing  EXTREMITIES: No lower extremity edema   LABORATORY DATA:  I have reviewed the data as listed CMP Latest Ref Rng & Units 06/22/2017 06/22/2017 12/24/2016  Glucose 65 - 99 mg/dL 92 - 87  BUN 6 - 20 mg/dL 17 - 14  Creatinine 0.44 - 1.00 mg/dL 0.86 1.00 0.86  Sodium 135 - 145 mmol/L 131(L) - 136  Potassium 3.5 - 5.1 mmol/L 4.4 - 4.1  Chloride 101 - 111 mmol/L 99(L) - 105  CO2 22 - 32 mmol/L 24 - 24  Calcium 8.9 - 10.3 mg/dL 8.5(L) - 9.0  Total Protein 6.5 - 8.1 g/dL 7.2 - 7.5  Total Bilirubin 0.3 - 1.2 mg/dL 0.7 - 0.6  Alkaline Phos 38 - 126 U/L 89 - 133(H)  AST 15 - 41 U/L 24 - 27  ALT 14 - 54 U/L  16 - 19   No results found for: [id number]   Lab Results  Component Value Date   WBC 7.9 06/22/2017   HGB 10.3 (L) 06/22/2017   HCT 33.4 (L) 06/22/2017   MCV 83.9 06/22/2017   PLT 199 06/22/2017   NEUTROABS 4.5 06/22/2017    ASSESSMENT & PLAN:  Adenocarcinoma of pancreas ([facility]) 1.  Stage IIb (T3N1) pancreatic adenocarcinoma: -Status post Whipple surgery on 06/21/2013 at [place] - Did not receive adjuvant chemotherapy due to complicated postoperative course. -I have reviewed the results of the CT scan of the abdomen and pelvis with contrast dated 06/21/2016 which shows post Whipple procedure changes without evidence of recurrence.  Very small volume of gas within the bladder. -CA 19-9 went up to 194, from a normal level 3 months before.  This is very concerning for recurrence.  I will order a PET CT scan to rule out occult metastatic disease.  She will be seen back after the PET scan.  2.  Normocytic anemia: -She denies any bleeding per rectum or melena.  Her hemoglobin dropped by one-point.  MCV also decreased.  We will  check ferritin, iron panel, Q25 and folic acid today.    Orders Placed This Encounter  Procedures  . NM PET Image Initial (PI) Skull Base To Thigh    Standing Status:   Future    Standing Expiration Date:   06/24/2018    Order Specific Question:   If indicated for the ordered procedure, I authorize the administration of a radiopharmaceutical per Radiology protocol    Answer:   Yes    Order Specific Question:   Preferred imaging location?    Answer:   [facility]    Order Specific Question:   Radiology Contrast Protocol - do NOT remove file path    Answer:   [email or web address]    Order Specific Question:   Reason for Exam additional comments    Answer:   h/o stage II pancreatic cancer with marked elevation in CA 19-9.  Marland Kitchen Vitamin B12    Standing Status:   Future    Standing Expiration Date:   06/25/2018  . Folate    Standing Status:   Future    Standing Expiration Date:   06/25/2018  . Iron and TIBC    Standing Status:   Future    Standing Expiration Date:   06/25/2018  . Ferritin    Standing Status:   Future    Standing Expiration Date:   06/25/2018   The patient has a good understanding of the overall plan. she agrees with it. she will call with any problems that may develop before the next visit here.  This note includes documentation from Mike Craze, NP, who was present during this patient's office visit and evaluation.  I have reviewed this note for its completeness and accuracy.  I have edited this note accordingly based on my findings and medical opinion.      Derek Jack, MD 06/24/17

## 2017-06-24 NOTE — Patient Instructions (Signed)
[facility] Discharge Instructions  Today you saw Dr. K.   Thank you for choosing [facility] to provide your oncology and hematology care.  To afford each patient quality time with our provider, please arrive at least 15 minutes before your scheduled appointment time.   If you have a lab appointment with the Cancer Center please come in thru the  Main Entrance and check in at the main information desk  You need to re-schedule your appointment should you arrive 10 or more minutes late.  We strive to give you quality time with our providers, and arriving late affects you and other patients whose appointments are after yours.  Also, if you no show three or more times for appointments you may be dismissed from the clinic at the providers discretion.     Again, thank you for choosing [facility].  Our hope is that these requests will decrease the amount of time that you wait before being seen by our physicians.       _____________________________________________________________  Should you have questions after your visit to [facility], please contact our office at [phone] between the hours of 8:30 a.m. and 4:30 p.m.  Voicemails left after 4:30 p.m. will not be returned until the following business day.  For prescription refill requests, have your pharmacy contact our office.       Resources For Cancer Patients and their Caregivers ? American Cancer Society: Can assist with transportation, wigs, general needs, runs Look Good Feel Better.        1-888-227-6333 ? Cancer Care: Provides financial assistance, online support groups, medication/co-pay assistance.  1-800-813-HOPE (4673) ? [facility] Assists [place] cancer patients and their families through emotional , educational and financial support.  336-427-4357 ? [place] DSS Where to apply for food  stamps, Medicaid and utility assistance. 336-342-1394 ? [facility]: Transportation to medical appointments. 336-347-2287 ? Social Security Administration: May apply for disability if have a Stage IV cancer. 336-342-7796 1-800-772-1213 ? [place] Aging, Disability and Transit Services: Assists with nutrition, care and transit needs. 336-349-2343  Cancer Center Support Programs:   > Cancer Support Group  2nd Tuesday of the month 1pm-2pm, Journey Room   > Creative Journey  3rd Tuesday of the month 1130am-1pm, Journey Room    

## 2017-06-27 ENCOUNTER — Other Ambulatory Visit (HOSPITAL_COMMUNITY): Payer: Self-pay | Admitting: Adult Health

## 2017-06-27 DIAGNOSIS — C259 Malignant neoplasm of pancreas, unspecified: Secondary | ICD-10-CM

## 2017-06-28 NOTE — Telephone Encounter (Signed)
Michelle,   Please ask Dr. Katragadda if he will mind refilling.   Thanks!  Gretchen Dawson, NP [facility] [phone]  

## 2017-07-01 ENCOUNTER — Ambulatory Visit (HOSPITAL_COMMUNITY)
Admission: RE | Admit: 2017-07-01 | Discharge: 2017-07-01 | Disposition: A | Discharge status: Home / Self Care | Payer: Medicare Other | Source: Ambulatory Visit | Attending: Hematology | Admitting: Hematology

## 2017-07-01 DIAGNOSIS — R978 Other abnormal tumor markers: Secondary | ICD-10-CM | POA: Insufficient documentation

## 2017-07-01 DIAGNOSIS — Z90411 Acquired partial absence of pancreas: Secondary | ICD-10-CM | POA: Diagnosis not present

## 2017-07-01 DIAGNOSIS — I700 Atherosclerosis of aorta: Secondary | ICD-10-CM | POA: Insufficient documentation

## 2017-07-01 DIAGNOSIS — I2510 Atherosclerotic heart disease of native coronary artery without angina pectoris: Secondary | ICD-10-CM | POA: Diagnosis not present

## 2017-07-01 DIAGNOSIS — C259 Malignant neoplasm of pancreas, unspecified: Secondary | ICD-10-CM | POA: Insufficient documentation

## 2017-07-01 LAB — GLUCOSE, CAPILLARY: Glucose-Capillary: 87 mg/dL (ref 65–99)

## 2017-07-01 MED ORDER — FLUDEOXYGLUCOSE F - 18 (FDG) INJECTION
6.1600 | Freq: Once | INTRAVENOUS | Status: AC | PRN
  Administered 2017-07-01: 6.16 via INTRAVENOUS

## 2017-07-06 ENCOUNTER — Encounter (HOSPITAL_COMMUNITY): Payer: Self-pay | Admitting: Hematology

## 2017-07-06 ENCOUNTER — Inpatient Hospital Stay (HOSPITAL_BASED_OUTPATIENT_CLINIC_OR_DEPARTMENT_OTHER): Payer: Medicare Other | Admitting: Hematology

## 2017-07-06 VITALS — BP 120/55 | HR 76 | Temp 98.0°F | Resp 18 | Wt 125.0 lb

## 2017-07-06 DIAGNOSIS — C250 Malignant neoplasm of head of pancreas: Secondary | ICD-10-CM | POA: Diagnosis not present

## 2017-07-06 DIAGNOSIS — R5383 Other fatigue: Secondary | ICD-10-CM

## 2017-07-06 DIAGNOSIS — R109 Unspecified abdominal pain: Secondary | ICD-10-CM

## 2017-07-06 DIAGNOSIS — R197 Diarrhea, unspecified: Secondary | ICD-10-CM | POA: Diagnosis not present

## 2017-07-06 DIAGNOSIS — Z87891 Personal history of nicotine dependence: Secondary | ICD-10-CM | POA: Diagnosis not present

## 2017-07-06 DIAGNOSIS — R110 Nausea: Secondary | ICD-10-CM | POA: Diagnosis not present

## 2017-07-06 DIAGNOSIS — R978 Other abnormal tumor markers: Secondary | ICD-10-CM | POA: Diagnosis not present

## 2017-07-06 DIAGNOSIS — Z79899 Other long term (current) drug therapy: Secondary | ICD-10-CM | POA: Diagnosis not present

## 2017-07-06 DIAGNOSIS — D649 Anemia, unspecified: Secondary | ICD-10-CM | POA: Diagnosis not present

## 2017-07-06 DIAGNOSIS — C259 Malignant neoplasm of pancreas, unspecified: Secondary | ICD-10-CM

## 2017-07-06 NOTE — Progress Notes (Signed)
[address] [place], [place] [address]   CLINIC:  Medical Oncology/Hematology  PCP:  Caryl Bis, MD [place] [address] [phone]   REASON FOR VISIT:  Follow-up for pancreatic cancer.  CURRENT THERAPY: Close observation.  BRIEF ONCOLOGIC HISTORY:    Adenocarcinoma of pancreas ([place])   05/23/2013 Tumor Marker    Patient's tumor was tested for the following markers: CA 19-9. Results of the tumor marker test revealed 86.      05/24/2013 Tumor Marker    Patient's tumor was tested for the following markers: CEA. Results of the tumor marker test revealed 5.      06/18/2013 Procedure    Malignant distal CBD stricture in distal 2-3cm of CBD- 10 x 67mm covered metal stent placed         06/21/2013 Procedure    Diagnostic laparoscopy, open pylorus preserving pancreaticoduodenectomy, omentectomy by Dr. Maggie Font ([place])        06/26/2013 Cancer Staging    [id number]      06/26/2013 Pathology Results    Adenocarcinoma of distal common bile duct in the head of pancreas, 1.2 cm, moderately differentiated, 1 mitosis/10 HPF, negative margines, perineural invasion identified, peripancreatic soft tissue invasion seen, tumor involves pancreatic acinar tissue and submucosa of duodenum near ampulla, 2/7 positive nodes      01/06/2016 Imaging    CT abd/pelvis- 1. Patient status post Whipple procedure without evidence of pancreatic carcinoma recurrence or metastasis. 2. No surgical complication identified. 3. Bilateral pars defects with grade 1 anterolisthesis at L5-S1.      06/21/2016 Imaging    Ct abd/pelvis- Stable postop changes from Whipple procedure. No evidence of recurrent or metastatic carcinoma within abdomen.  Incidentally noted aortic atherosclerosis and bilateral L5 pars defects, with degenerative disc disease and grade 2 anterolisthesis at L5-S1        CANCER STAGING: Cancer Staging Adenocarcinoma of pancreas  [facility]) Staging form: Pancreas, AJCC 7th Edition - Pathologic stage from 06/26/2013: Stage IIB (T3, N1, cM0) - Signed by B[facility], PA-C on 12/23/2015    INTERVAL HISTORY:  Maureen Vaughn 67 y.o. female returns for follow-up of PET scan results.  She is accompanied by her daughter and granddaughter.  Has up to 1-2 watery bowel movements per day.  On a worse day she has up to 3/day.  She has on and off abdominal pains.  No new pains were reported.  She is taking Creon 3 times a day.  Diarrhea overall has improved since the start of Creon.  She complains of fatigue on minimal exertion.  Denies any fevers, night sweats or weight loss.  REVIEW OF SYSTEMS:  Review of Systems  Constitutional: Positive for fatigue.  Gastrointestinal: Positive for abdominal pain and diarrhea.  All other systems reviewed and are negative.    PAST MEDICAL/SURGICAL HISTORY:  Past Medical History:  Diagnosis Date  . Adenocarcinoma of pancreas ([place]) 12/23/2015  . Cancer [facility])    pancreatic  . Chronic abdominal pain   . Chronic diarrhea   . Depression   . Falls frequently   . Fracture of right humerus   . Hip fracture, right ([place])   . Hypotension    Past Surgical History:  Procedure Laterality Date  . GASTROSTOMY TUBE PLACEMENT       SOCIAL HISTORY:  Social History   Socioeconomic History  . Marital status: Legally Separated    Spouse name: Not on file  . Number of children:  Not on file  . Years of education: Not on file  . Highest education level: Not on file  Occupational History  . Not on file  Social Needs  . Financial resource strain: Not on file  . Food insecurity:    Worry: Not on file    Inability: Not on file  . Transportation needs:    Medical: Not on file    Non-medical: Not on file  Tobacco Use  . Smoking status: Former Smoker    Packs/day: 2.00    Years: 35.00    Pack years: 70.00    Last attempt to quit: 12/23/2010    Years since quitting: 6.5  . Smokeless tobacco:  Never Used  Substance and Sexual Activity  . 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 Drug use: No  . Sexual activity: Not on file  Lifestyle  . Physical activity:    Days per week: Not on file    Minutes per session: Not on file  . Stress: Not on file  Relationships  . Social connections:    Talks on phone: Not on file    Gets together: Not on file    Attends religious service: Not on file    Active member of club or organization: Not on file    Attends meetings of clubs or organizations: Not on file    Relationship status: Not on file  . Intimate partner violence:    Fear of current or ex partner: Not on file    Emotionally abused: Not on file    Physically abused: Not on file    Forced sexual activity: Not on file  Other Topics Concern  . Not on file  Social History Narrative  . Not on file    FAMILY HISTORY:  Family History  Adopted: Yes    CURRENT MEDICATIONS:  Outpatient Encounter Medications as of 07/06/2017  Medication Sig Note  . CREON 36000 units CPEP capsule TAKE ONE CAPSULE BY MOUTH THREE TIMES DAILY WITH MEALS.   . diazepam (VALIUM) 5 MG tablet Take by mouth. 12/23/2015: Received from: [place]: Take 1 tablet by mouth 3 (three) times a day as needed.  . DULoxetine (CYMBALTA) 60 MG capsule Take 60 mg by mouth daily.   Marland Kitchen ibandronate (BONIVA) 150 MG tablet Take by mouth. 12/23/2015: Received from: [place]: Take 150 mg by mouth every 30 (thirty) days. Take one 150 mg tablet monthly on the same day each month. Swallow whole tablet with 6-8 oz of plain water only. Don't eat, drink (except for water), or take oth  . metoprolol succinate (TOPROL-XL) 25 MG 24 hr tablet Take by mouth.   . nitrofurantoin, macrocrystal-monohydrate, (MACROBID) 100 MG capsule Take 1 capsule (100 mg total) 2 (two) times daily by mouth.   Marland Kitchen omeprazole (PRILOSEC) 40 MG capsule Take 40 mg by mouth daily. 12/23/2015:  Received from: External Pharmacy Received Sig: TAKE ONE CAPSULE BY MOUTH DAILY  . ondansetron (ZOFRAN) 4 MG tablet Take 4 mg by mouth every 8 (eight) hours as needed. 12/23/2015: Received from: External Pharmacy Received Sig: TAKE ONE TABLET BY MOUTH EVERY 8 HOURS AS NEEDED  . potassium chloride (K-DUR,KLOR-CON) 10 MEQ tablet Take by mouth. 12/23/2015: Received from: [place]: Take 1 tablet by mouth daily.  . traMADol (ULTRAM) 50 MG tablet TAKE TWO (2) TABLETS BY MOUTH FOUR TIMES DAILY AS NEEDED FOR PAIN   .  XARELTO 20 MG TABS tablet Take 20 mg daily by mouth.    No facility-administered encounter medications on file as of 07/06/2017.     ALLERGIES:  No Known Allergies   PHYSICAL EXAM:  ECOG Performance status: 1  Vitals:   07/06/17 1525  BP: (!) 120/55  Pulse: 76  Resp: 18  Temp: 98 F (36.7 C)  SpO2: 100%   Filed Weights   07/06/17 1525  Weight: 125 lb (56.7 kg)    Physical Exam  Deferred. LABORATORY DATA:  I have reviewed the labs as listed.  CBC    Component Value Date/Time   WBC 7.9 06/22/2017 1457   RBC 3.98 06/22/2017 1457   HGB 10.3 (L) 06/22/2017 1457   HCT 33.4 (L) 06/22/2017 1457   PLT 199 06/22/2017 1457   MCV 83.9 06/22/2017 1457   MCH 25.9 (L) 06/22/2017 1457   MCHC 30.8 06/22/2017 1457   RDW 16.7 (H) 06/22/2017 1457   LYMPHSABS 2.5 06/22/2017 1457   MONOABS 0.5 06/22/2017 1457   EOSABS 0.4 06/22/2017 1457   BASOSABS 0.0 06/22/2017 1457   CMP Latest Ref Rng & Units 06/22/2017 06/22/2017 12/24/2016  Glucose 65 - 99 mg/dL 92 - 87  BUN 6 - 20 mg/dL 17 - 14  Creatinine 0.44 - 1.00 mg/dL 0.86 1.00 0.86  Sodium 135 - 145 mmol/L 131(L) - 136  Potassium 3.5 - 5.1 mmol/L 4.4 - 4.1  Chloride 101 - 111 mmol/L 99(L) - 105  CO2 22 - 32 mmol/L 24 - 24  Calcium 8.9 - 10.3 mg/dL 8.5(L) - 9.0  Total Protein 6.5 - 8.1 g/dL 7.2 - 7.5  Total Bilirubin 0.3 - 1.2 mg/dL 0.7 - 0.6  Alkaline Phos 38 - 126 U/L 89 - 133(H)  AST 15 - 41 U/L 24 - 27    ALT 14 - 54 U/L 16 - 19       DIAGNOSTIC IMAGING:  I have personally reviewed PET CT scan images from 07/01/2017.  Agree with radiology report.    ASSESSMENT & PLAN:   Adenocarcinoma of pancreas ([place]) 1.  Stage IIb (T3N1) pancreatic adenocarcinoma: -Status post Whipple surgery on 06/21/2013 at [place] - Did not receive adjuvant chemotherapy due to complicated postoperative course. -I have reviewed the results of the CT scan of the abdomen and pelvis with contrast dated 06/21/2016 which shows post Whipple procedure changes without evidence of recurrence.  Very small volume of gas within the bladder. -CA 19-9 went up to 194, from a normal level 3 months before.  This is very concerning for recurrence. - I have discussed the PET CT scan results from 07/01/2017 which did not show any evidence of recurrence in the pancreatic area.  There is uptake in the left tonsil.  Patient was ex-smoker who quit 10 years ago.  Throat examination did not reveal any lesions.  I will make a referral to Dr. Benjamine Mola for further evaluation. - I plan to repeat a CA 19-9 level in 2 months.  2.  Normocytic anemia: -She denies any bleeding per rectum or melena.  Her hemoglobin dropped by one-point.  MCV also decreased.  We have done further work-up.  Her ferritin was low at 26 with a percent saturation of 10.  B12 was also borderline at 283.  She ready has a abdominal symptoms including diarrhea.  She wants to avoid oral preparations.  She has a lot of fatigue.  We talked about parenteral iron therapy in the form of Feraheme weekly x2.  We  talked about the side effects in detail.  We will schedule it next week.  We will give her 1 shot of B12 injection.  I have told her to take 1 mg B12 tablet daily.  I plan to repeat it in 2 months.  3.  Diarrhea: -She is taking Creon 36,000 units 3 times a day with meals.  She has watery stools of about 1 to 2/day.  On a worse day she has up to 3/day.  She also has some on and off  abdominal pain.      Orders placed this encounter:  Orders Placed This Encounter  Procedures  . CBC with Differential/Platelet  . Comprehensive metabolic panel  . Cancer antigen 19-9  . Ferritin  . Iron and TIBC  . Vitamin B12      Derek Jack, [place] [phone]

## 2017-07-06 NOTE — Assessment & Plan Note (Signed)
1.  Stage IIb (T3N1) pancreatic adenocarcinoma: -Status post Whipple surgery on 06/21/2013 at [facility] - Did not receive adjuvant chemotherapy due to complicated postoperative course. -I have reviewed the results of the CT scan of the abdomen and pelvis with contrast dated 06/21/2016 which shows post Whipple procedure changes without evidence of recurrence.  Very small volume of gas within the bladder. -CA 19-9 went up to 194, from a normal level 3 months before.  This is very concerning for recurrence. - I have discussed the PET CT scan results from 07/01/2017 which did not show any evidence of recurrence in the pancreatic area.  There is uptake in the left tonsil.  Patient was ex-smoker who quit 10 years ago.  Throat examination did not reveal any lesions.  I will make a referral to Dr. Benjamine Mola for further evaluation. - I plan to repeat a CA 19-9 level in 2 months.  2.  Normocytic anemia: -She denies any bleeding per rectum or melena.  Her hemoglobin dropped by one-point.  MCV also decreased.  We have done further work-up.  Her ferritin was low at 26 with a percent saturation of 10.  B12 was also borderline at 283.  She ready has a abdominal symptoms including diarrhea.  She wants to avoid oral preparations.  She has a lot of fatigue.  We talked about parenteral iron therapy in the form of Feraheme weekly x2.  We talked about the side effects in detail.  We will schedule it next week.  We will give her 1 shot of B12 injection.  I have told her to take 1 mg B12 tablet daily.  I plan to repeat it in 2 months.  3.  Diarrhea: -She is taking Creon 36,000 units 3 times a day with meals.  She has watery stools of about 1 to 2/day.  On a worse day she has up to 3/day.  She also has some on and off abdominal pain.

## 2017-07-06 NOTE — Patient Instructions (Signed)
[facility] at [facility]  Discharge Instructions:  You saw dr. k today.  _______________________________________________________________  Thank you for choosing [facility] at [facility] to provide your oncology and hematology care.  To afford each patient quality time with our providers, please arrive at least 15 minutes before your scheduled appointment.  You need to re-schedule your appointment if you arrive 10 or more minutes late.  We strive to give you quality time with our providers, and arriving late affects you and other patients whose appointments are after yours.  Also, if you no show three or more times for appointments you may be dismissed from the clinic.  Again, thank you for choosing [facility] at [facility]. Our hope is that these requests will allow you access to exceptional care and in a timely manner. _______________________________________________________________  If you have questions after your visit, please contact our office at [phone] between the hours of 8:30 a.m. and 5:00 p.m. Voicemails left after 4:30 p.m. will not be returned until the following business day. _______________________________________________________________  For prescription refill requests, have your pharmacy contact our office. _______________________________________________________________  Recommendations made by the consultant and any test results will be sent to your referring physician. _______________________________________________________________ 

## 2017-07-07 ENCOUNTER — Encounter (HOSPITAL_COMMUNITY): Payer: Self-pay | Admitting: Lab

## 2017-07-07 NOTE — Progress Notes (Unsigned)
Referral to Dr Benjamine Mola.  Records faxed to 5/30.  They will call pt will appt.

## 2017-07-13 ENCOUNTER — Inpatient Hospital Stay (HOSPITAL_COMMUNITY): Payer: Medicare Other | Attending: Hematology

## 2017-07-13 DIAGNOSIS — D649 Anemia, unspecified: Secondary | ICD-10-CM | POA: Diagnosis not present

## 2017-07-13 MED ORDER — CYANOCOBALAMIN 1000 MCG/ML IJ SOLN
1000.0000 ug | Freq: Once | INTRAMUSCULAR | Status: AC
  Administered 2017-07-13: 1000 ug via INTRAMUSCULAR
  Filled 2017-07-13: qty 1

## 2017-07-13 MED ORDER — SODIUM CHLORIDE 0.9 % IV SOLN
510.0000 mg | Freq: Once | INTRAVENOUS | Status: AC
  Administered 2017-07-13: 510 mg via INTRAVENOUS
  Filled 2017-07-13: qty 17

## 2017-07-13 MED ORDER — SODIUM CHLORIDE 0.9 % IV SOLN
INTRAVENOUS | Status: DC
  Administered 2017-07-13: 14:00:00 via INTRAVENOUS

## 2017-07-13 NOTE — Progress Notes (Signed)
Maureen Vaughn presents today for injection per MD orders. B12 1,000 mcg administered IM  in left Upper Arm. Administration without incident. Patient tolerated well.  feraheme given today per orders. Patient tolerated it well without problems. Vitals stable and discharged home from clinic ambulatory. Follow up as scheduled.

## 2017-07-13 NOTE — Patient Instructions (Signed)
[facility] at [facility] Discharge Instructions    Thank you for choosing [facility] at [facility] to provide your oncology and hematology care.  To afford each patient quality time with our provider, please arrive at least 15 minutes before your scheduled appointment time.   If you have a lab appointment with the Cancer Center please come in thru the  Main Entrance and check in at the main information desk  You need to re-schedule your appointment should you arrive 10 or more minutes late.  We strive to give you quality time with our providers, and arriving late affects you and other patients whose appointments are after yours.  Also, if you no show three or more times for appointments you may be dismissed from the clinic at the providers discretion.     Again, thank you for choosing [facility].  Our hope is that these requests will decrease the amount of time that you wait before being seen by our physicians.       _____________________________________________________________  Should you have questions after your visit to  Cancer Center, please contact our office at [phone] between the hours of 8:30 a.m. and 4:30 p.m.  Voicemails left after 4:30 p.m. will not be returned until the following business day.  For prescription refill requests, have your pharmacy contact our office.       Resources For Cancer Patients and their Caregivers ? American Cancer Society: Can assist with transportation, wigs, general needs, runs Look Good Feel Better.        1-888-227-6333 ? Cancer Care: Provides financial assistance, online support groups, medication/co-pay assistance.  1-800-813-HOPE (4673) ? [facility] Assists [place] cancer patients and their families through emotional , educational and financial support.  336-427-4357 ? [place] DSS Where to apply for food stamps, Medicaid and  utility assistance. 336-342-1394 ? [facility]: Transportation to medical appointments. 336-347-2287 ? Social Security Administration: May apply for disability if have a Stage IV cancer. 336-342-7796 1-800-772-1213 ? [place] Aging, Disability and Transit Services: Assists with nutrition, care and transit needs. 336-349-2343  Cancer Center Support Programs:   > Cancer Support Group  2nd Tuesday of the month 1pm-2pm, Journey Room   > Creative Journey  3rd Tuesday of the month 1130am-1pm, Journey Room    

## 2017-07-20 ENCOUNTER — Inpatient Hospital Stay (HOSPITAL_COMMUNITY): Payer: Medicare Other

## 2017-07-20 ENCOUNTER — Encounter (HOSPITAL_COMMUNITY): Payer: Self-pay

## 2017-07-20 DIAGNOSIS — D649 Anemia, unspecified: Secondary | ICD-10-CM | POA: Diagnosis not present

## 2017-07-20 MED ORDER — SODIUM CHLORIDE 0.9 % IV SOLN
510.0000 mg | Freq: Once | INTRAVENOUS | Status: AC
  Administered 2017-07-20: 510 mg via INTRAVENOUS
  Filled 2017-07-20: qty 17

## 2017-07-20 MED ORDER — SODIUM CHLORIDE 0.9 % IV SOLN
INTRAVENOUS | Status: DC
  Administered 2017-07-20: 14:00:00 via INTRAVENOUS

## 2017-07-20 NOTE — Patient Instructions (Signed)
[facility] Discharge Instructions  Received Feraheme infusion today. Follow-up as scheduled. Call clinic for any questions or concerns   Thank you for choosing [facility] at [facility] to provide your oncology and hematology care.  To afford each patient quality time with our provider, please arrive at least 15 minutes before your scheduled appointment time.   If you have a lab appointment with the Cancer Center please come in thru the  Main Entrance and check in at the main information desk  You need to re-schedule your appointment should you arrive 10 or more minutes late.  We strive to give you quality time with our providers, and arriving late affects you and other patients whose appointments are after yours.  Also, if you no show three or more times for appointments you may be dismissed from the clinic at the providers discretion.     Again, thank you for choosing [facility].  Our hope is that these requests will decrease the amount of time that you wait before being seen by our physicians.       _____________________________________________________________  Should you have questions after your visit to [facility], please contact our office at [phone] between the hours of 8:30 a.m. and 4:30 p.m.  Voicemails left after 4:30 p.m. will not be returned until the following business day.  For prescription refill requests, have your pharmacy contact our office.       Resources For Cancer Patients and their Caregivers ? American Cancer Society: Can assist with transportation, wigs, general needs, runs Look Good Feel Better.        1-888-227-6333 ? Cancer Care: Provides financial assistance, online support groups, medication/co-pay assistance.  1-800-813-HOPE (4673) ? [facility] Assists [place] cancer patients and their families through emotional , educational and  financial support.  336-427-4357 ? [place] DSS Where to apply for food stamps, Medicaid and utility assistance. 336-342-1394 ? RC[facility]: Transportation to medical appointments. 336-347-2287 ? Social Security Administration: May apply for disability if have a Stage IV cancer. 336-342-7796 1-800-772-1213 ? [place] Aging, Disability and Transit Services: Assists with nutrition, care and transit needs. 336-349-2343  Cancer Center Support Programs:   > Cancer Support Group  2nd Tuesday of the month 1pm-2pm, Journey Room   > Creative Journey  3rd Tuesday of the month 1130am-1pm, Journey Room    

## 2017-07-20 NOTE — Progress Notes (Signed)
Maureen Vaughn tolerated Feraheme infusion well without complaints or incident. Peripheral IV site with positive blood return prior to and after completion of Feraheme infusion. VSS upon discharge. Pt discharged self ambulatory using cane in satisfactory condition accompanied by a family member

## 2017-08-08 ENCOUNTER — Ambulatory Visit (INDEPENDENT_AMBULATORY_CARE_PROVIDER_SITE_OTHER): Payer: Medicare Other | Admitting: Otolaryngology

## 2017-08-08 DIAGNOSIS — D3705 Neoplasm of uncertain behavior of pharynx: Secondary | ICD-10-CM

## 2017-09-02 ENCOUNTER — Inpatient Hospital Stay (HOSPITAL_COMMUNITY): Payer: Medicare Other | Attending: Hematology

## 2017-09-02 DIAGNOSIS — D649 Anemia, unspecified: Secondary | ICD-10-CM | POA: Diagnosis not present

## 2017-09-02 DIAGNOSIS — C250 Malignant neoplasm of head of pancreas: Secondary | ICD-10-CM | POA: Diagnosis not present

## 2017-09-02 DIAGNOSIS — C259 Malignant neoplasm of pancreas, unspecified: Secondary | ICD-10-CM

## 2017-09-02 LAB — VITAMIN B12: Vitamin B-12: 404 pg/mL (ref 180–914)

## 2017-09-02 LAB — CBC WITH DIFFERENTIAL/PLATELET
Basophils Absolute: 0 10*3/uL (ref 0.0–0.1)
Basophils Relative: 0 %
EOS ABS: 0.2 10*3/uL (ref 0.0–0.7)
Eosinophils Relative: 3 %
HCT: 39.6 % (ref 36.0–46.0)
HEMOGLOBIN: 12.8 g/dL (ref 12.0–15.0)
LYMPHS ABS: 2.2 10*3/uL (ref 0.7–4.0)
Lymphocytes Relative: 25 %
MCH: 28.3 pg (ref 26.0–34.0)
MCHC: 32.3 g/dL (ref 30.0–36.0)
MCV: 87.4 fL (ref 78.0–100.0)
MONO ABS: 0.5 10*3/uL (ref 0.1–1.0)
MONOS PCT: 6 %
NEUTROS PCT: 66 %
Neutro Abs: 6 10*3/uL (ref 1.7–7.7)
Platelets: 259 10*3/uL (ref 150–400)
RBC: 4.53 MIL/uL (ref 3.87–5.11)
RDW: 17.2 % — AB (ref 11.5–15.5)
WBC: 9 10*3/uL (ref 4.0–10.5)

## 2017-09-02 LAB — COMPREHENSIVE METABOLIC PANEL
ALBUMIN: 3.8 g/dL (ref 3.5–5.0)
ALT: 16 U/L (ref 0–44)
AST: 22 U/L (ref 15–41)
Alkaline Phosphatase: 101 U/L (ref 38–126)
Anion gap: 7 (ref 5–15)
BUN: 16 mg/dL (ref 8–23)
CHLORIDE: 104 mmol/L (ref 98–111)
CO2: 26 mmol/L (ref 22–32)
Calcium: 9 mg/dL (ref 8.9–10.3)
Creatinine, Ser: 1.03 mg/dL — ABNORMAL HIGH (ref 0.44–1.00)
GFR calc Af Amer: >60 mL/min (ref >60)
GFR calc non Af Amer: 55 mL/min — ABNORMAL LOW (ref >60)
GLUCOSE: 89 mg/dL (ref 70–99)
POTASSIUM: 4.3 mmol/L (ref 3.5–5.1)
SODIUM: 137 mmol/L (ref 135–145)
Total Bilirubin: 0.5 mg/dL (ref 0.3–1.2)
Total Protein: 7.2 g/dL (ref 6.5–8.1)

## 2017-09-02 LAB — IRON AND TIBC
Iron: 92 ug/dL (ref 28–170)
SATURATION RATIOS: 29 % (ref 10.4–31.8)
TIBC: 312 ug/dL (ref 250–450)
UIBC: 220 ug/dL

## 2017-09-02 LAB — FERRITIN: Ferritin: 239 ng/mL (ref 11–307)

## 2017-09-03 LAB — CANCER ANTIGEN 19-9: CAN 19-9: 16 U/mL (ref 0–35)

## 2017-09-09 ENCOUNTER — Other Ambulatory Visit: Payer: Self-pay

## 2017-09-09 ENCOUNTER — Encounter (HOSPITAL_COMMUNITY): Payer: Self-pay | Admitting: Hematology

## 2017-09-09 ENCOUNTER — Inpatient Hospital Stay (HOSPITAL_COMMUNITY): Payer: Medicare Other | Attending: Hematology | Admitting: Hematology

## 2017-09-09 VITALS — BP 125/67 | HR 71 | Temp 97.7°F | Resp 18 | Wt 127.8 lb

## 2017-09-09 DIAGNOSIS — D649 Anemia, unspecified: Secondary | ICD-10-CM | POA: Diagnosis not present

## 2017-09-09 DIAGNOSIS — Z87891 Personal history of nicotine dependence: Secondary | ICD-10-CM | POA: Diagnosis not present

## 2017-09-09 DIAGNOSIS — C259 Malignant neoplasm of pancreas, unspecified: Secondary | ICD-10-CM | POA: Diagnosis not present

## 2017-09-09 DIAGNOSIS — R531 Weakness: Secondary | ICD-10-CM | POA: Diagnosis not present

## 2017-09-09 DIAGNOSIS — H9390 Unspecified disorder of ear, unspecified ear: Secondary | ICD-10-CM | POA: Diagnosis not present

## 2017-09-09 DIAGNOSIS — R5383 Other fatigue: Secondary | ICD-10-CM | POA: Diagnosis not present

## 2017-09-09 DIAGNOSIS — R197 Diarrhea, unspecified: Secondary | ICD-10-CM | POA: Diagnosis not present

## 2017-09-09 NOTE — Progress Notes (Signed)
[place], [place] [address]   CLINIC:  Medical Oncology/Hematology  PCP:  Caryl Bis, MD [place] [address] [phone]   REASON FOR VISIT:  Follow-up for pancreatic cancer  CURRENT THERAPY: observation  BRIEF ONCOLOGIC HISTORY:    Adenocarcinoma of pancreas ([place])   05/23/2013 Tumor Marker    Patient's tumor was tested for the following markers: CA 19-9. Results of the tumor marker test revealed 86.      05/24/2013 Tumor Marker    Patient's tumor was tested for the following markers: CEA. Results of the tumor marker test revealed 5.      06/18/2013 Procedure    Malignant distal CBD stricture in distal 2-3cm of CBD- 10 x 35mm covered metal stent placed         06/21/2013 Procedure    Diagnostic laparoscopy, open pylorus preserving pancreaticoduodenectomy, omentectomy by Dr. 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 Patient status post Whipple procedure without evidence of pancreatic carcinoma recurrence or metastasis. 2. No surgical complication identified. 3. Bilateral pars defects with grade 1 anterolisthesis at L5-S1.      06/21/2016 Imaging    Ct abd/pelvis- Stable postop changes from Whipple procedure. No evidence of recurrent or metastatic carcinoma within abdomen.  Incidentally noted aortic atherosclerosis and bilateral L5 pars defects, with degenerative disc disease and grade 2 anterolisthesis at L5-S1        CANCER STAGING: Cancer Staging Adenocarcinoma of pancreas  [facility]) Staging form: Pancreas, AJCC 7th Edition - Pathologic stage from 06/26/2013: Stage IIB (T3, N1, cM0) - Signed by [facility], PA-C on 12/23/2015    INTERVAL HISTORY:  Maureen Vaughn 67 y.o. female returns for routine follow-up for pancreatic cancer. Patient is here today with her sister. She states she is still weak even after she received the iron infusions. She also had diarrhea for 3 days after the iron infusions. She is fatigued during the day and however tries to remain active at home. She is having an issue with her ear. It feels like it is stopped up and she states she "hears stuff moving around in it or someone is hammering in her ear". Her ears itch constantly. The sounds come and go. This is new it started this week. Denies any new pains. Denies any nausea, vomiting, or constipation. Denies any fevers or infections recently. Her appetite is a good at 100% and she is maintaining her weight. Patient energy level is down to 25%.    REVIEW OF SYSTEMS:  Review of Systems  Constitutional: Positive for fatigue.  HENT:  Negative.   Eyes: Negative.   Respiratory: Negative.   Cardiovascular: Negative.   Gastrointestinal: Positive for abdominal pain and diarrhea (only after iron infusions).  Endocrine: Negative.   Genitourinary: Negative.    Musculoskeletal: Negative.   Skin: Negative.   Neurological: Positive for dizziness.  Hematological: Bruises/bleeds easily.  Psychiatric/Behavioral: Negative.      PAST MEDICAL/SURGICAL HISTORY:  Past Medical History:  Diagnosis Date  . Adenocarcinoma of pancreas ([place])  12/23/2015  . Cancer [facility])    pancreatic  . Chronic abdominal pain   . Chronic diarrhea   . Depression   . Falls frequently   . Fracture of right humerus   . Hip fracture, right ([place])   . Hypotension    Past Surgical History:  Procedure Laterality Date  . GASTROSTOMY TUBE PLACEMENT       SOCIAL HISTORY:  Social History   Socioeconomic History  . Marital  status: Legally Separated    Spouse name: Not on file  . Number of children: Not on file  . Years of education: Not on file  . Highest education level: Not on file  Occupational History  . Not on file  Social Needs  . Financial resource strain: Not on file  . Food insecurity:    Worry: Not on file    Inability: Not on file  . Transportation needs:    Medical: Not on file    Non-medical: Not on file  Tobacco Use  . Smoking status: Former Smoker    Packs/day: 2.00    Years: 35.00    Pack years: 70.00    Last attempt to quit: 12/23/2010    Years since quitting: 6.7  . Smokeless tobacco: Never Used  Substance and Sexual Activity  . 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 Drug use: No  . Sexual activity: Not on file  Lifestyle  . Physical activity:    Days per week: Not on file    Minutes per session: Not on file  . Stress: Not on file  Relationships  . Social connections:    Talks on phone: Not on file    Gets together: Not on file    Attends religious service: Not on file    Active member of club or organization: Not on file    Attends meetings of clubs or organizations: Not on file    Relationship status: Not on file  . Intimate partner violence:    Fear of current or ex partner: Not on file    Emotionally abused: Not on file    Physically abused: Not on file    Forced sexual activity: Not on file  Other Topics Concern  . Not on file  Social History Narrative  . Not on file    FAMILY HISTORY:  Family History  Adopted: Yes    CURRENT MEDICATIONS:  Outpatient Encounter Medications as of 09/09/2017  Medication Sig Note  . CREON 36000 units CPEP capsule TAKE ONE CAPSULE BY MOUTH THREE TIMES DAILY WITH MEALS.   . diazepam (VALIUM) 5 MG tablet Take by mouth. 12/23/2015: Received from: [place]: Take 1 tablet by mouth 3 (three) times a day as needed.  . DULoxetine (CYMBALTA) 60 MG capsule Take 60 mg by  mouth daily.   Marland Kitchen ibandronate (BONIVA) 150 MG tablet Take by mouth. 12/23/2015: Received from: [place]: Take 150 mg by mouth every 30 (thirty) days. Take one 150 mg tablet monthly on the same day each month. Swallow whole tablet with 6-8 oz of plain water only. Don't eat, drink (except for water), or take oth  . metoprolol succinate (TOPROL-XL) 25 MG 24 hr tablet Take by mouth.   . nitrofurantoin, macrocrystal-monohydrate, (MACROBID) 100 MG capsule Take 1 capsule (100 mg total) 2 (two) times daily by mouth.   Marland Kitchen omeprazole (PRILOSEC) 40 MG capsule Take 40 mg by mouth daily.  12/23/2015: Received from: External Pharmacy Received Sig: TAKE ONE CAPSULE BY MOUTH DAILY  . ondansetron (ZOFRAN) 4 MG tablet Take 4 mg by mouth every 8 (eight) hours as needed. 12/23/2015: Received from: External Pharmacy Received Sig: TAKE ONE TABLET BY MOUTH EVERY 8 HOURS AS NEEDED  . potassium chloride (K-DUR,KLOR-CON) 10 MEQ tablet Take by mouth. 12/23/2015: Received from: [place]: Take 1 tablet by mouth daily.  . traMADol (ULTRAM) 50 MG tablet TAKE TWO (2) TABLETS BY MOUTH FOUR TIMES DAILY AS NEEDED FOR PAIN   . XARELTO 20 MG TABS tablet Take 20 mg daily by mouth.    No facility-administered encounter medications on file as of 09/09/2017.     ALLERGIES:  No Known Allergies   PHYSICAL EXAM:  ECOG Performance status: 1  Vitals:   09/09/17 1358  BP: 125/67  Pulse: 71  Resp: 18  Temp: 97.7 F (36.5 C)  SpO2: 97%   Filed Weights   09/09/17 1358  Weight: 127 lb 12.8 oz (58 kg)    Physical Exam HEENT: Oropharynx has no thrush.  Bilateral ear exam did not reveal any abnormalities. Chest: Bilateral clear to auscultation. CVS: S1-S2 regular rate and rhythm. Abdomen: No palpable masses or hepatospleno megaly. Extremities: No edema or cyanosis.  LABORATORY DATA:  I have reviewed the labs as listed.  CBC    Component Value Date/Time   WBC 9.0 09/02/2017 1348   RBC 4.53  09/02/2017 1348   HGB 12.8 09/02/2017 1348   HCT 39.6 09/02/2017 1348   PLT 259 09/02/2017 1348   MCV 87.4 09/02/2017 1348   MCH 28.3 09/02/2017 1348   MCHC 32.3 09/02/2017 1348   RDW 17.2 (H) 09/02/2017 1348   LYMPHSABS 2.2 09/02/2017 1348   MONOABS 0.5 09/02/2017 1348   EOSABS 0.2 09/02/2017 1348   BASOSABS 0.0 09/02/2017 1348   CMP Latest Ref Rng & Units 09/02/2017 06/22/2017 06/22/2017  Glucose 70 - 99 mg/dL 89 92 -  BUN 8 - 23 mg/dL 16 17 -  Creatinine 0.44 - 1.00 mg/dL 1.03(H) 0.86 1.00  Sodium 135 - 145 mmol/L 137 131(L) -  Potassium 3.5 - 5.1 mmol/L 4.3 4.4 -  Chloride 98 - 111 mmol/L 104 99(L) -  CO2 22 - 32 mmol/L 26 24 -  Calcium 8.9 - 10.3 mg/dL 9.0 8.5(L) -  Total Protein 6.5 - 8.1 g/dL 7.2 7.2 -  Total Bilirubin 0.3 - 1.2 mg/dL 0.5 0.7 -  Alkaline Phos 38 - 126 U/L 101 89 -  AST 15 - 41 U/L 22 24 -  ALT 0 - 44 U/L 16 16 -       ASSESSMENT & PLAN:   Adenocarcinoma of pancreas (HCC) 1.  Stage IIb (T3N1) pancreatic adenocarcinoma: -Status post Whipple surgery on 06/21/2013 at [place] - Did not receive adjuvant chemotherapy due to complicated postoperative course. -I have reviewed the results of the CT scan of the abdomen and pelvis with contrast dated 06/21/2016 which shows post Whipple procedure changes without evidence of recurrence.  Very small volume of gas within the bladder. -CA 19-9 went up to 194, from a normal level 3 months before.  This is very concerning for recurrence. - PET CT scan from 07/01/2017 did not show any evidence of recurrence in the pancreatic area. There is uptake in the left tonsil.  Patient was ex-smoker who quit 10 years ago.  Throat examination did not reveal any lesions. -Dr. Benjamine Mola has seen her on 08/08/2017 and did flexible fiberoptic laryngoscopy  which did not reveal any suspicious lesions.  She was given the option of left tonsillectomy versus close follow-up.  She chose follow-up in 3 months. - Today's physical examination was  unrevealing.  She complained of closing up of both her ears on and off.  I have examined her ears which were normal.  Her CA 19-9 was also within normal limits along with her blood work. -She will come back in 4 months with repeat blood work and physical exam.  2.  Normocytic anemia: -She has received Feraheme on 07/13/2017 and 07/20/2017.  Hemoglobin improved to 12.8 from 10.3.  Ferritin is 239.  3.  Diarrhea: -She is taking Creon 36,000 units 3 times a day with meals.  She has watery stools of about 1 to 2/day.  On a worse day she has up to 3/day.  She also has some on and off abdominal pain.      Orders placed this encounter:  Orders Placed This Encounter  Procedures  . CBC with Differential/Platelet  . Comprehensive metabolic panel  . Ferritin  . Iron and TIBC  . Cancer antigen 19-9  . Vitamin B12  . Folate      Derek Jack, MD [place] [phone]

## 2017-09-09 NOTE — Assessment & Plan Note (Signed)
1.  Stage IIb (T3N1) pancreatic adenocarcinoma: -Status post Whipple surgery on 06/21/2013 at [place] - Did not receive adjuvant chemotherapy due to complicated postoperative course. -I have reviewed the results of the CT scan of the abdomen and pelvis with contrast dated 06/21/2016 which shows post Whipple procedure changes without evidence of recurrence.  Very small volume of gas within the bladder. -CA 19-9 went up to 194, from a normal level 3 months before.  This is very concerning for recurrence. - PET CT scan from 07/01/2017 did not show any evidence of recurrence in the pancreatic area. There is uptake in the left tonsil.  Patient was ex-smoker who quit 10 years ago.  Throat examination did not reveal any lesions. -Dr. Benjamine Mola has seen her on 08/08/2017 and did flexible fiberoptic laryngoscopy which did not reveal any suspicious lesions.  She was given the option of left tonsillectomy versus close follow-up.  She chose follow-up in 3 months. - Today's physical examination was unrevealing.  She complained of closing up of both her ears on and off.  I have examined her ears which were normal.  Her CA 19-9 was also within normal limits along with her blood work. -She will come back in 4 months with repeat blood work and physical exam.  2.  Normocytic anemia: -She has received Feraheme on 07/13/2017 and 07/20/2017.  Hemoglobin improved to 12.8 from 10.3.  Ferritin is 239.  3.  Diarrhea: -She is taking Creon 36,000 units 3 times a day with meals.  She has watery stools of about 1 to 2/day.  On a worse day she has up to 3/day.  She also has some on and off abdominal pain.

## 2017-11-14 ENCOUNTER — Ambulatory Visit (INDEPENDENT_AMBULATORY_CARE_PROVIDER_SITE_OTHER): Payer: Medicare Other | Admitting: Otolaryngology

## 2017-11-14 DIAGNOSIS — J351 Hypertrophy of tonsils: Secondary | ICD-10-CM | POA: Diagnosis not present

## 2017-11-14 DIAGNOSIS — R070 Pain in throat: Secondary | ICD-10-CM

## 2018-01-09 ENCOUNTER — Inpatient Hospital Stay (HOSPITAL_COMMUNITY): Payer: Medicare Other | Attending: Hematology

## 2018-01-09 DIAGNOSIS — Z87891 Personal history of nicotine dependence: Secondary | ICD-10-CM | POA: Insufficient documentation

## 2018-01-09 DIAGNOSIS — C259 Malignant neoplasm of pancreas, unspecified: Secondary | ICD-10-CM | POA: Insufficient documentation

## 2018-01-09 DIAGNOSIS — D649 Anemia, unspecified: Secondary | ICD-10-CM | POA: Insufficient documentation

## 2018-01-09 DIAGNOSIS — R197 Diarrhea, unspecified: Secondary | ICD-10-CM | POA: Diagnosis not present

## 2018-01-09 DIAGNOSIS — Z23 Encounter for immunization: Secondary | ICD-10-CM | POA: Diagnosis not present

## 2018-01-09 LAB — CBC WITH DIFFERENTIAL/PLATELET
ABS IMMATURE GRANULOCYTES: 0.02 10*3/uL (ref 0.00–0.07)
BASOS ABS: 0.1 10*3/uL (ref 0.0–0.1)
BASOS PCT: 1 %
Eosinophils Absolute: 0.4 10*3/uL (ref 0.0–0.5)
Eosinophils Relative: 4 %
HCT: 47.1 % — ABNORMAL HIGH (ref 36.0–46.0)
HEMOGLOBIN: 14.7 g/dL (ref 12.0–15.0)
IMMATURE GRANULOCYTES: 0 %
LYMPHS PCT: 28 %
Lymphs Abs: 2.7 10*3/uL (ref 0.7–4.0)
MCH: 28.2 pg (ref 26.0–34.0)
MCHC: 31.2 g/dL (ref 30.0–36.0)
MCV: 90.2 fL (ref 80.0–100.0)
Monocytes Absolute: 0.4 10*3/uL (ref 0.1–1.0)
Monocytes Relative: 4 %
NEUTROS ABS: 6 10*3/uL (ref 1.7–7.7)
NEUTROS PCT: 63 %
NRBC: 0 % (ref 0.0–0.2)
PLATELETS: 325 10*3/uL (ref 150–400)
RBC: 5.22 MIL/uL — AB (ref 3.87–5.11)
RDW: 14.8 % (ref 11.5–15.5)
WBC: 9.6 10*3/uL (ref 4.0–10.5)

## 2018-01-09 LAB — FOLATE: Folate: 14.1 ng/mL (ref >5.9)

## 2018-01-09 LAB — COMPREHENSIVE METABOLIC PANEL
ALBUMIN: 4.7 g/dL (ref 3.5–5.0)
ALK PHOS: 101 U/L (ref 38–126)
ALT: 22 U/L (ref 0–44)
ANION GAP: 8 (ref 5–15)
AST: 29 U/L (ref 15–41)
BUN: 16 mg/dL (ref 8–23)
CALCIUM: 9.3 mg/dL (ref 8.9–10.3)
CHLORIDE: 106 mmol/L (ref 98–111)
CO2: 23 mmol/L (ref 22–32)
CREATININE: 1.3 mg/dL — AB (ref 0.44–1.00)
GFR, EST AFRICAN AMERICAN: 49 mL/min — AB (ref >60)
GFR, EST NON AFRICAN AMERICAN: 42 mL/min — AB (ref >60)
Glucose, Bld: 90 mg/dL (ref 70–99)
Potassium: 4.5 mmol/L (ref 3.5–5.1)
SODIUM: 137 mmol/L (ref 135–145)
Total Bilirubin: 1 mg/dL (ref 0.3–1.2)
Total Protein: 8.5 g/dL — ABNORMAL HIGH (ref 6.5–8.1)

## 2018-01-09 LAB — VITAMIN B12: VITAMIN B 12: 450 pg/mL (ref 180–914)

## 2018-01-09 LAB — IRON AND TIBC
Iron: 104 ug/dL (ref 28–170)
Saturation Ratios: 32 % — ABNORMAL HIGH (ref 10.4–31.8)
TIBC: 325 ug/dL (ref 250–450)
UIBC: 221 ug/dL

## 2018-01-09 LAB — FERRITIN: FERRITIN: 216 ng/mL (ref 11–307)

## 2018-01-10 LAB — CANCER ANTIGEN 19-9: CA 19-9: 13 U/mL (ref 0–35)

## 2018-01-13 ENCOUNTER — Ambulatory Visit (HOSPITAL_COMMUNITY): Payer: Medicare Other | Admitting: Hematology

## 2018-01-17 ENCOUNTER — Encounter (HOSPITAL_COMMUNITY): Payer: Self-pay | Admitting: Hematology

## 2018-01-17 ENCOUNTER — Inpatient Hospital Stay (HOSPITAL_BASED_OUTPATIENT_CLINIC_OR_DEPARTMENT_OTHER): Payer: Medicare Other | Admitting: Hematology

## 2018-01-17 ENCOUNTER — Other Ambulatory Visit: Payer: Self-pay

## 2018-01-17 ENCOUNTER — Ambulatory Visit (HOSPITAL_COMMUNITY): Payer: Medicare Other | Admitting: Hematology

## 2018-01-17 VITALS — BP 129/76 | HR 65 | Temp 98.2°F | Resp 20 | Wt 129.0 lb

## 2018-01-17 DIAGNOSIS — D649 Anemia, unspecified: Secondary | ICD-10-CM | POA: Diagnosis not present

## 2018-01-17 DIAGNOSIS — Z87891 Personal history of nicotine dependence: Secondary | ICD-10-CM

## 2018-01-17 DIAGNOSIS — C259 Malignant neoplasm of pancreas, unspecified: Secondary | ICD-10-CM

## 2018-01-17 DIAGNOSIS — R197 Diarrhea, unspecified: Secondary | ICD-10-CM | POA: Diagnosis not present

## 2018-01-17 DIAGNOSIS — Z23 Encounter for immunization: Secondary | ICD-10-CM | POA: Diagnosis not present

## 2018-01-17 DIAGNOSIS — R0609 Other forms of dyspnea: Secondary | ICD-10-CM

## 2018-01-17 MED ORDER — INFLUENZA VAC SPLIT HIGH-DOSE 0.5 ML IM SUSY
0.5000 mL | PREFILLED_SYRINGE | INTRAMUSCULAR | Status: AC
  Administered 2018-01-17: 0.5 mL via INTRAMUSCULAR
  Filled 2018-01-17: qty 0.5

## 2018-01-17 NOTE — Patient Instructions (Signed)
Exam and discussion today with Dr. Katragadda. Return for follow-up as scheduled.  

## 2018-01-17 NOTE — Assessment & Plan Note (Signed)
1.  Stage IIb (T3N1) pancreatic adenocarcinoma: -Status post Whipple surgery on 06/21/2013 at [place] - Did not receive adjuvant chemotherapy due to complicated postoperative course. -I have reviewed the results of the CT scan of the abdomen and pelvis with contrast dated 06/21/2016 which shows post Whipple procedure changes without evidence of recurrence.  Very small volume of gas within the bladder. -CA 19-9 went up to 194, from a normal level 3 months before.  This is very concerning for recurrence. - PET CT scan from 07/01/2017 did not show any evidence of recurrence in the pancreatic area. There is uptake in the left tonsil.  Patient was ex-smoker who quit 10 years ago.  Throat examination did not reveal any lesions. -Dr. Benjamine Mola has seen her on 08/08/2017 and did flexible fiberoptic laryngoscopy which did not reveal any suspicious lesions.  She was given the option of left tonsillectomy versus close follow-up.  She chose follow-up in 3 months. - Today's physical exam was unrevealing.  Her tumor markers are within normal limits. -She is following up with Dr. Benjamine Mola for her left tonsillar uptake on the PET scan.  She does not want to do any surgery at this time. -I will see her back in 3 months for follow-up.  I will plan to do a PET CT scan prior to next visit.  2.  Normocytic anemia: -She has received Feraheme on 07/13/2017 and 07/20/2017.  Hemoglobin improved to 12.8 from 10.3.  Ferritin is 239.  3.  Diarrhea: -She is taking Creon 36,000 units 3 times a day with meals. -Her diarrhea is stable at this time.

## 2018-01-17 NOTE — Progress Notes (Signed)
[address], [place] [address]   CLINIC:  Medical Oncology/Hematology  PCP:  Caryl Bis, MD [place] [address] [phone]   REASON FOR VISIT: Follow-up for pancreatic cancer and tonsillar nodule  CURRENT THERAPY: Observation  BRIEF ONCOLOGIC HISTORY:    Adenocarcinoma of pancreas ([place])   05/23/2013 Tumor Marker    Patient's tumor was tested for the following markers: CA 19-9. Results of the tumor marker test revealed 86.    05/24/2013 Tumor Marker    Patient's tumor was tested for the following markers: CEA. Results of the tumor marker test revealed 5.    06/18/2013 Procedure    Malignant distal CBD stricture in distal 2-3cm of CBD- 10 x 74mm covered metal stent placed       06/21/2013 Procedure    Diagnostic laparoscopy, open pylorus preserving pancreaticoduodenectomy, omentectomy by Dr. 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 Patient status post Whipple procedure without evidence of pancreatic carcinoma recurrence or metastasis. 2. No surgical complication identified. 3. Bilateral pars defects with grade 1 anterolisthesis at L5-S1.    06/21/2016 Imaging    Ct abd/pelvis- Stable postop changes from Whipple procedure. No evidence of recurrent or metastatic carcinoma within abdomen.  Incidentally noted aortic atherosclerosis and bilateral L5 pars defects, with degenerative disc disease and grade 2 anterolisthesis at L5-S1      CANCER STAGING: Cancer Staging Adenocarcinoma of pancreas [facility]) Staging  form: Pancreas, AJCC 7th Edition - Pathologic stage from 06/26/2013: Stage IIB (T3, N1, cM0) - Signed by Baird Cancer, PA-C on 12/23/2015    INTERVAL HISTORY:  [name] 67 y.o. female returns for routine follow-up for pancreatic cancer and tonsillar nodule. She is here today with a friend. She reports some occasional diarrhea and SOB with exertion. She denies any bleeding or easy bruising. Denies any nausea, vomiting, or constipation. Denies any fevers or recent infections. Denies any new pains. She reports her appetite at 100%. Her energy level at 25%. She tries to remain active at home. She walks with a walker.    REVIEW OF SYSTEMS:  Review of Systems  Constitutional: Positive for fatigue.  Respiratory: Positive for shortness of breath.   Gastrointestinal: Positive for diarrhea.  All other systems reviewed and are negative.    PAST MEDICAL/SURGICAL HISTORY:  Past Medical History:  Diagnosis Date  . Adenocarcinoma of pancreas ([place]) 12/23/2015  . Cancer [facility])    pancreatic  . Chronic abdominal pain   . Chronic diarrhea   . Depression   . Falls frequently   . Fracture of right humerus   . Hip fracture, right ([place])   . Hypotension    Past Surgical History:  Procedure Laterality Date  . GASTROSTOMY TUBE PLACEMENT       SOCIAL HISTORY:  Social History   Socioeconomic History  . Marital status: Legally Separated    Spouse name: Not on file  . Number of children: Not on file  . Years of education: Not on file  . Highest education level: Not on file  Occupational History  .  Not on file  Social Needs  . Financial resource strain: Not on file  . Food insecurity:    Worry: Not on file    Inability: Not on file  . Transportation needs:    Medical: Not on file    Non-medical: Not on file  Tobacco Use  . Smoking status: Former Smoker    Packs/day: 2.00    Years: 35.00    Pack years: 70.00    Last attempt to quit: 12/23/2010    Years since quitting: 7.0  .  Smokeless tobacco: Never Used  Substance and Sexual Activity  . 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 Drug use: No  . Sexual activity: Not on file  Lifestyle  . Physical activity:    Days per week: Not on file    Minutes per session: Not on file  . Stress: Not on file  Relationships  . Social connections:    Talks on phone: Not on file    Gets together: Not on file    Attends religious service: Not on file    Active member of club or organization: Not on file    Attends meetings of clubs or organizations: Not on file    Relationship status: Not on file  . Intimate partner violence:    Fear of current or ex partner: Not on file    Emotionally abused: Not on file    Physically abused: Not on file    Forced sexual activity: Not on file  Other Topics Concern  . Not on file  Social History Narrative  . Not on file    FAMILY HISTORY:  Family History  Adopted: Yes    CURRENT MEDICATIONS:  Outpatient Encounter Medications as of 01/17/2018  Medication Sig Note  . CREON 36000 units CPEP capsule TAKE ONE CAPSULE BY MOUTH THREE TIMES DAILY WITH MEALS.   . diazepam (VALIUM) 5 MG tablet Take 5 mg by mouth 3 (three) times daily.  12/23/2015: Received from: [facility]: Take 1 tablet by mouth 3 (three) times a day as needed.  . DULoxetine (CYMBALTA) 60 MG capsule Take 60 mg by mouth daily.   Marland Kitchen ibandronate (BONIVA) 150 MG tablet Take by mouth every 30 (thirty) days.  12/23/2015: Received from: [facility]: Take 150 mg by mouth every 30 (thirty) days. Take one 150 mg tablet monthly on the same day each month. Swallow whole tablet with 6-8 oz of plain water only. Don't eat, drink (except for water), or take oth  . metoprolol succinate (TOPROL-XL) 25 MG 24 hr tablet Take 12.5 mg by mouth daily.    Marland Kitchen omeprazole (PRILOSEC) 40 MG capsule Take 40 mg by mouth daily. 12/23/2015: Received from: External Pharmacy Received Sig:  TAKE ONE CAPSULE BY MOUTH DAILY  . ondansetron (ZOFRAN) 4 MG tablet Take 4 mg by mouth every 8 (eight) hours as needed. 12/23/2015: Received from: External Pharmacy Received Sig: TAKE ONE TABLET BY MOUTH EVERY 8 HOURS AS NEEDED  . potassium chloride (K-DUR,KLOR-CON) 10 MEQ tablet Take 10 mEq by mouth daily.  12/23/2015: Received from: [facility]: Take 1 tablet by mouth daily.  . traMADol (ULTRAM) 50 MG tablet TAKE TWO (2) TABLETS BY MOUTH FOUR TIMES DAILY AS NEEDED FOR PAIN   . XARELTO 20 MG TABS tablet Take 20 mg daily by mouth.   . [DISCONTINUED] nitrofurantoin, macrocrystal-monohydrate, (MACROBID) 100 MG capsule Take 1 capsule (100  mg total) 2 (two) times daily by mouth.   . [EXPIRED] Influenza vac split quadrivalent PF (FLUZONE HIGH-DOSE) injection 0.5 mL     No facility-administered encounter medications on file as of 01/17/2018.     ALLERGIES:  No Known Allergies   PHYSICAL EXAM:  ECOG Performance status: 1  Vitals:   01/17/18 1400  BP: 129/76  Pulse: 65  Resp: 20  Temp: 98.2 F (36.8 C)  SpO2: 99%   Filed Weights   01/17/18 1400  Weight: 129 lb (58.5 kg)    Physical Exam  Constitutional: She is oriented to person, place, and time. She appears well-developed and well-nourished.  Musculoskeletal: Normal range of motion.  Neurological: She is alert and oriented to person, place, and time.  Skin: Skin is warm and dry.  Psychiatric: She has a normal mood and affect. Her behavior is normal. Judgment and thought content normal.     LABORATORY DATA:  I have reviewed the labs as listed.  CBC    Component Value Date/Time   WBC 9.6 01/09/2018 1239   RBC 5.22 (H) 01/09/2018 1239   HGB 14.7 01/09/2018 1239   HCT 47.1 (H) 01/09/2018 1239   PLT 325 01/09/2018 1239   MCV 90.2 01/09/2018 1239   MCH 28.2 01/09/2018 1239   MCHC 31.2 01/09/2018 1239   RDW 14.8 01/09/2018 1239   LYMPHSABS 2.7 01/09/2018 1239   MONOABS 0.4 01/09/2018 1239   EOSABS 0.4  01/09/2018 1239   BASOSABS 0.1 01/09/2018 1239   CMP Latest Ref Rng & Units 01/09/2018 09/02/2017 06/22/2017  Glucose 70 - 99 mg/dL 90 89 92  BUN 8 - 23 mg/dL 16 16 17   Creatinine 0.44 - 1.00 mg/dL 1.30(H) 1.03(H) 0.86  Sodium 135 - 145 mmol/L 137 137 131(L)  Potassium 3.5 - 5.1 mmol/L 4.5 4.3 4.4  Chloride 98 - 111 mmol/L 106 104 99(L)  CO2 22 - 32 mmol/L 23 26 24   Calcium 8.9 - 10.3 mg/dL 9.3 9.0 8.5(L)  Total Protein 6.5 - 8.1 g/dL 8.5(H) 7.2 7.2  Total Bilirubin 0.3 - 1.2 mg/dL 1.0 0.5 0.7  Alkaline Phos 38 - 126 U/L 101 101 89  AST 15 - 41 U/L 29 22 24   ALT 0 - 44 U/L 22 16 16        DIAGNOSTIC IMAGING:  I have independently reviewed the scans and discussed with the patient. .  I have reviewed Francene Finders, NP's note and agree with the documentation.  I personally performed a face-to-face visit, made revisions and my assessment and plan is as follows.     ASSESSMENT & PLAN:   Adenocarcinoma of pancreas ([place]) 1.  Stage IIb (T3N1) pancreatic adenocarcinoma: -Status post Whipple surgery on 06/21/2013 at [facility] - Did not receive adjuvant chemotherapy due to complicated postoperative course. -I have reviewed the results of the CT scan of the abdomen and pelvis with contrast dated 06/21/2016 which shows post Whipple procedure changes without evidence of recurrence.  Very small volume of gas within the bladder. -CA 19-9 went up to 194, from a normal level 3 months before.  This is very concerning for recurrence. - PET CT scan from 07/01/2017 did not show any evidence of recurrence in the pancreatic area. There is uptake in the left tonsil.  Patient was ex-smoker who quit 10 years ago.  Throat examination did not reveal any lesions. -Dr. Benjamine Mola has seen her on 08/08/2017 and did flexible fiberoptic laryngoscopy which did not reveal any suspicious lesions.  She was given  the option of left tonsillectomy versus close follow-up.  She chose follow-up in 3 months. - Today's physical exam  was unrevealing.  Her tumor markers are within normal limits. -She is following up with Dr. Benjamine Mola for her left tonsillar uptake on the PET scan.  She does not want to do any surgery at this time. -I will see her back in 3 months for follow-up.  I will plan to do a PET CT scan prior to next visit.  2.  Normocytic anemia: -She has received Feraheme on 07/13/2017 and 07/20/2017.  Hemoglobin improved to 12.8 from 10.3.  Ferritin is 239.  3.  Diarrhea: -She is taking Creon 36,000 units 3 times a day with meals. -Her diarrhea is stable at this time.      Orders placed this encounter:  Orders Placed This Encounter  Procedures  . NM PET Image Restag (PS) Skull Base To Thigh  . Cancer antigen 19-9  . CBC with Differential/Platelet  . Comprehensive metabolic panel  . Ferritin  . Iron and TIBC  . Vitamin B12  . Folate  . Lactate dehydrogenase      Derek Jack, MD [place] [phone]

## 2018-01-31 DIAGNOSIS — E860 Dehydration: Secondary | ICD-10-CM | POA: Diagnosis not present

## 2018-01-31 DIAGNOSIS — J441 Chronic obstructive pulmonary disease with (acute) exacerbation: Secondary | ICD-10-CM | POA: Diagnosis present

## 2018-01-31 DIAGNOSIS — J9801 Acute bronchospasm: Secondary | ICD-10-CM | POA: Diagnosis present

## 2018-01-31 DIAGNOSIS — F339 Major depressive disorder, recurrent, unspecified: Secondary | ICD-10-CM | POA: Diagnosis present

## 2018-01-31 DIAGNOSIS — F419 Anxiety disorder, unspecified: Secondary | ICD-10-CM | POA: Diagnosis present

## 2018-01-31 DIAGNOSIS — C259 Malignant neoplasm of pancreas, unspecified: Secondary | ICD-10-CM | POA: Diagnosis not present

## 2018-01-31 DIAGNOSIS — Z90411 Acquired partial absence of pancreas: Secondary | ICD-10-CM | POA: Diagnosis not present

## 2018-01-31 DIAGNOSIS — I10 Essential (primary) hypertension: Secondary | ICD-10-CM | POA: Diagnosis present

## 2018-01-31 DIAGNOSIS — A4151 Sepsis due to Escherichia coli [E. coli]: Secondary | ICD-10-CM | POA: Diagnosis present

## 2018-01-31 DIAGNOSIS — N3000 Acute cystitis without hematuria: Secondary | ICD-10-CM | POA: Diagnosis not present

## 2018-01-31 DIAGNOSIS — Z7901 Long term (current) use of anticoagulants: Secondary | ICD-10-CM | POA: Diagnosis not present

## 2018-01-31 DIAGNOSIS — N390 Urinary tract infection, site not specified: Secondary | ICD-10-CM | POA: Diagnosis not present

## 2018-01-31 DIAGNOSIS — K219 Gastro-esophageal reflux disease without esophagitis: Secondary | ICD-10-CM | POA: Diagnosis present

## 2018-01-31 DIAGNOSIS — R6520 Severe sepsis without septic shock: Secondary | ICD-10-CM | POA: Diagnosis present

## 2018-01-31 DIAGNOSIS — E875 Hyperkalemia: Secondary | ICD-10-CM | POA: Diagnosis not present

## 2018-01-31 DIAGNOSIS — K8689 Other specified diseases of pancreas: Secondary | ICD-10-CM | POA: Diagnosis present

## 2018-01-31 DIAGNOSIS — N179 Acute kidney failure, unspecified: Secondary | ICD-10-CM | POA: Diagnosis not present

## 2018-01-31 DIAGNOSIS — Z86718 Personal history of other venous thrombosis and embolism: Secondary | ICD-10-CM | POA: Diagnosis not present

## 2018-01-31 DIAGNOSIS — Z79899 Other long term (current) drug therapy: Secondary | ICD-10-CM | POA: Diagnosis not present

## 2018-01-31 DIAGNOSIS — D630 Anemia in neoplastic disease: Secondary | ICD-10-CM | POA: Diagnosis present

## 2018-01-31 DIAGNOSIS — E872 Acidosis: Secondary | ICD-10-CM | POA: Diagnosis present

## 2018-01-31 DIAGNOSIS — M1990 Unspecified osteoarthritis, unspecified site: Secondary | ICD-10-CM | POA: Diagnosis present

## 2018-01-31 DIAGNOSIS — Z66 Do not resuscitate: Secondary | ICD-10-CM | POA: Diagnosis present

## 2018-01-31 DIAGNOSIS — R0789 Other chest pain: Secondary | ICD-10-CM | POA: Diagnosis not present

## 2018-01-31 DIAGNOSIS — Z87891 Personal history of nicotine dependence: Secondary | ICD-10-CM | POA: Diagnosis not present

## 2018-01-31 DIAGNOSIS — E871 Hypo-osmolality and hyponatremia: Secondary | ICD-10-CM | POA: Diagnosis present

## 2018-01-31 DIAGNOSIS — M810 Age-related osteoporosis without current pathological fracture: Secondary | ICD-10-CM | POA: Diagnosis present

## 2018-01-31 DIAGNOSIS — D72829 Elevated white blood cell count, unspecified: Secondary | ICD-10-CM | POA: Diagnosis not present

## 2018-01-31 DIAGNOSIS — R509 Fever, unspecified: Secondary | ICD-10-CM | POA: Diagnosis not present

## 2018-02-07 DIAGNOSIS — N183 Chronic kidney disease, stage 3 (moderate): Secondary | ICD-10-CM | POA: Diagnosis not present

## 2018-02-07 DIAGNOSIS — A4151 Sepsis due to Escherichia coli [E. coli]: Secondary | ICD-10-CM | POA: Diagnosis not present

## 2018-02-07 DIAGNOSIS — D631 Anemia in chronic kidney disease: Secondary | ICD-10-CM | POA: Diagnosis not present

## 2018-02-07 DIAGNOSIS — E871 Hypo-osmolality and hyponatremia: Secondary | ICD-10-CM | POA: Diagnosis not present

## 2018-02-07 DIAGNOSIS — J449 Chronic obstructive pulmonary disease, unspecified: Secondary | ICD-10-CM | POA: Diagnosis not present

## 2018-02-07 DIAGNOSIS — N189 Chronic kidney disease, unspecified: Secondary | ICD-10-CM | POA: Diagnosis not present

## 2018-02-07 DIAGNOSIS — Z6825 Body mass index (BMI) 25.0-25.9, adult: Secondary | ICD-10-CM | POA: Diagnosis not present

## 2018-02-15 DIAGNOSIS — R1084 Generalized abdominal pain: Secondary | ICD-10-CM | POA: Diagnosis not present

## 2018-02-15 DIAGNOSIS — S37019A Minor contusion of unspecified kidney, initial encounter: Secondary | ICD-10-CM | POA: Diagnosis not present

## 2018-02-15 DIAGNOSIS — S37022A Major contusion of left kidney, initial encounter: Secondary | ICD-10-CM | POA: Diagnosis not present

## 2018-02-15 DIAGNOSIS — K439 Ventral hernia without obstruction or gangrene: Secondary | ICD-10-CM | POA: Diagnosis not present

## 2018-02-15 DIAGNOSIS — S37012A Minor contusion of left kidney, initial encounter: Secondary | ICD-10-CM | POA: Diagnosis not present

## 2018-02-15 DIAGNOSIS — R748 Abnormal levels of other serum enzymes: Secondary | ICD-10-CM | POA: Diagnosis not present

## 2018-02-16 ENCOUNTER — Ambulatory Visit (INDEPENDENT_AMBULATORY_CARE_PROVIDER_SITE_OTHER): Payer: Medicare Other | Admitting: Otolaryngology

## 2018-02-21 DIAGNOSIS — N390 Urinary tract infection, site not specified: Secondary | ICD-10-CM | POA: Diagnosis present

## 2018-02-21 DIAGNOSIS — A0472 Enterocolitis due to Clostridium difficile, not specified as recurrent: Secondary | ICD-10-CM | POA: Diagnosis present

## 2018-02-21 DIAGNOSIS — F3340 Major depressive disorder, recurrent, in remission, unspecified: Secondary | ICD-10-CM | POA: Diagnosis present

## 2018-02-21 DIAGNOSIS — D638 Anemia in other chronic diseases classified elsewhere: Secondary | ICD-10-CM | POA: Diagnosis present

## 2018-02-21 DIAGNOSIS — D473 Essential (hemorrhagic) thrombocythemia: Secondary | ICD-10-CM | POA: Diagnosis present

## 2018-02-21 DIAGNOSIS — R791 Abnormal coagulation profile: Secondary | ICD-10-CM | POA: Diagnosis not present

## 2018-02-21 DIAGNOSIS — M810 Age-related osteoporosis without current pathological fracture: Secondary | ICD-10-CM | POA: Diagnosis present

## 2018-02-21 DIAGNOSIS — R945 Abnormal results of liver function studies: Secondary | ICD-10-CM | POA: Diagnosis not present

## 2018-02-21 DIAGNOSIS — Z6841 Body Mass Index (BMI) 40.0 and over, adult: Secondary | ICD-10-CM | POA: Diagnosis not present

## 2018-02-21 DIAGNOSIS — R932 Abnormal findings on diagnostic imaging of liver and biliary tract: Secondary | ICD-10-CM | POA: Diagnosis not present

## 2018-02-21 DIAGNOSIS — N3000 Acute cystitis without hematuria: Secondary | ICD-10-CM | POA: Diagnosis not present

## 2018-02-21 DIAGNOSIS — A4151 Sepsis due to Escherichia coli [E. coli]: Secondary | ICD-10-CM | POA: Diagnosis present

## 2018-02-21 DIAGNOSIS — S37002A Unspecified injury of left kidney, initial encounter: Secondary | ICD-10-CM | POA: Diagnosis not present

## 2018-02-21 DIAGNOSIS — F411 Generalized anxiety disorder: Secondary | ICD-10-CM | POA: Diagnosis present

## 2018-02-21 DIAGNOSIS — E440 Moderate protein-calorie malnutrition: Secondary | ICD-10-CM | POA: Diagnosis not present

## 2018-02-21 DIAGNOSIS — Z8507 Personal history of malignant neoplasm of pancreas: Secondary | ICD-10-CM | POA: Diagnosis not present

## 2018-02-21 DIAGNOSIS — E860 Dehydration: Secondary | ICD-10-CM | POA: Diagnosis present

## 2018-02-21 DIAGNOSIS — G4700 Insomnia, unspecified: Secondary | ICD-10-CM | POA: Diagnosis present

## 2018-02-21 DIAGNOSIS — K5730 Diverticulosis of large intestine without perforation or abscess without bleeding: Secondary | ICD-10-CM | POA: Diagnosis not present

## 2018-02-21 DIAGNOSIS — M1990 Unspecified osteoarthritis, unspecified site: Secondary | ICD-10-CM | POA: Diagnosis present

## 2018-02-21 DIAGNOSIS — M7981 Nontraumatic hematoma of soft tissue: Secondary | ICD-10-CM | POA: Diagnosis not present

## 2018-02-21 DIAGNOSIS — I10 Essential (primary) hypertension: Secondary | ICD-10-CM | POA: Diagnosis present

## 2018-02-21 DIAGNOSIS — Z66 Do not resuscitate: Secondary | ICD-10-CM | POA: Diagnosis present

## 2018-02-21 DIAGNOSIS — S37019A Minor contusion of unspecified kidney, initial encounter: Secondary | ICD-10-CM | POA: Diagnosis not present

## 2018-02-21 DIAGNOSIS — N151 Renal and perinephric abscess: Secondary | ICD-10-CM | POA: Diagnosis present

## 2018-02-21 DIAGNOSIS — K219 Gastro-esophageal reflux disease without esophagitis: Secondary | ICD-10-CM | POA: Diagnosis present

## 2018-02-21 DIAGNOSIS — S37012A Minor contusion of left kidney, initial encounter: Secondary | ICD-10-CM | POA: Diagnosis present

## 2018-02-21 DIAGNOSIS — J441 Chronic obstructive pulmonary disease with (acute) exacerbation: Secondary | ICD-10-CM | POA: Diagnosis present

## 2018-02-21 DIAGNOSIS — E876 Hypokalemia: Secondary | ICD-10-CM | POA: Diagnosis not present

## 2018-02-21 DIAGNOSIS — S37011A Minor contusion of right kidney, initial encounter: Secondary | ICD-10-CM | POA: Diagnosis not present

## 2018-02-21 DIAGNOSIS — Z452 Encounter for adjustment and management of vascular access device: Secondary | ICD-10-CM | POA: Diagnosis not present

## 2018-02-21 DIAGNOSIS — K8689 Other specified diseases of pancreas: Secondary | ICD-10-CM | POA: Diagnosis present

## 2018-02-21 DIAGNOSIS — N179 Acute kidney failure, unspecified: Secondary | ICD-10-CM | POA: Diagnosis not present

## 2018-02-21 DIAGNOSIS — E871 Hypo-osmolality and hyponatremia: Secondary | ICD-10-CM | POA: Diagnosis not present

## 2018-03-26 DIAGNOSIS — J441 Chronic obstructive pulmonary disease with (acute) exacerbation: Secondary | ICD-10-CM | POA: Diagnosis not present

## 2018-03-26 DIAGNOSIS — E440 Moderate protein-calorie malnutrition: Secondary | ICD-10-CM | POA: Diagnosis not present

## 2018-03-26 DIAGNOSIS — Z87440 Personal history of urinary (tract) infections: Secondary | ICD-10-CM | POA: Diagnosis not present

## 2018-03-26 DIAGNOSIS — Z8673 Personal history of transient ischemic attack (TIA), and cerebral infarction without residual deficits: Secondary | ICD-10-CM | POA: Diagnosis not present

## 2018-03-26 DIAGNOSIS — Z96641 Presence of right artificial hip joint: Secondary | ICD-10-CM | POA: Diagnosis not present

## 2018-03-26 DIAGNOSIS — K8689 Other specified diseases of pancreas: Secondary | ICD-10-CM | POA: Diagnosis not present

## 2018-03-26 DIAGNOSIS — D638 Anemia in other chronic diseases classified elsewhere: Secondary | ICD-10-CM | POA: Diagnosis not present

## 2018-03-26 DIAGNOSIS — I82509 Chronic embolism and thrombosis of unspecified deep veins of unspecified lower extremity: Secondary | ICD-10-CM | POA: Diagnosis not present

## 2018-03-26 DIAGNOSIS — Z8507 Personal history of malignant neoplasm of pancreas: Secondary | ICD-10-CM | POA: Diagnosis not present

## 2018-03-26 DIAGNOSIS — R531 Weakness: Secondary | ICD-10-CM | POA: Diagnosis not present

## 2018-03-26 DIAGNOSIS — Z79899 Other long term (current) drug therapy: Secondary | ICD-10-CM | POA: Diagnosis not present

## 2018-03-26 DIAGNOSIS — M810 Age-related osteoporosis without current pathological fracture: Secondary | ICD-10-CM | POA: Diagnosis not present

## 2018-03-26 DIAGNOSIS — Z8619 Personal history of other infectious and parasitic diseases: Secondary | ICD-10-CM | POA: Diagnosis not present

## 2018-03-27 DIAGNOSIS — Z0001 Encounter for general adult medical examination with abnormal findings: Secondary | ICD-10-CM | POA: Diagnosis not present

## 2018-03-27 DIAGNOSIS — Z6822 Body mass index (BMI) 22.0-22.9, adult: Secondary | ICD-10-CM | POA: Diagnosis not present

## 2018-03-28 DIAGNOSIS — J441 Chronic obstructive pulmonary disease with (acute) exacerbation: Secondary | ICD-10-CM | POA: Diagnosis not present

## 2018-03-28 DIAGNOSIS — I82509 Chronic embolism and thrombosis of unspecified deep veins of unspecified lower extremity: Secondary | ICD-10-CM | POA: Diagnosis not present

## 2018-03-28 DIAGNOSIS — E440 Moderate protein-calorie malnutrition: Secondary | ICD-10-CM | POA: Diagnosis not present

## 2018-03-28 DIAGNOSIS — R531 Weakness: Secondary | ICD-10-CM | POA: Diagnosis not present

## 2018-03-28 DIAGNOSIS — D638 Anemia in other chronic diseases classified elsewhere: Secondary | ICD-10-CM | POA: Diagnosis not present

## 2018-03-28 DIAGNOSIS — K8689 Other specified diseases of pancreas: Secondary | ICD-10-CM | POA: Diagnosis not present

## 2018-03-29 DIAGNOSIS — D638 Anemia in other chronic diseases classified elsewhere: Secondary | ICD-10-CM | POA: Diagnosis not present

## 2018-03-29 DIAGNOSIS — A419 Sepsis, unspecified organism: Secondary | ICD-10-CM | POA: Diagnosis not present

## 2018-03-29 DIAGNOSIS — R531 Weakness: Secondary | ICD-10-CM | POA: Diagnosis not present

## 2018-03-29 DIAGNOSIS — K8689 Other specified diseases of pancreas: Secondary | ICD-10-CM | POA: Diagnosis not present

## 2018-03-29 DIAGNOSIS — E440 Moderate protein-calorie malnutrition: Secondary | ICD-10-CM | POA: Diagnosis not present

## 2018-03-29 DIAGNOSIS — A4151 Sepsis due to Escherichia coli [E. coli]: Secondary | ICD-10-CM | POA: Diagnosis not present

## 2018-03-29 DIAGNOSIS — D649 Anemia, unspecified: Secondary | ICD-10-CM | POA: Diagnosis not present

## 2018-03-29 DIAGNOSIS — N289 Disorder of kidney and ureter, unspecified: Secondary | ICD-10-CM | POA: Diagnosis not present

## 2018-03-29 DIAGNOSIS — J441 Chronic obstructive pulmonary disease with (acute) exacerbation: Secondary | ICD-10-CM | POA: Diagnosis not present

## 2018-03-29 DIAGNOSIS — I82509 Chronic embolism and thrombosis of unspecified deep veins of unspecified lower extremity: Secondary | ICD-10-CM | POA: Diagnosis not present

## 2018-03-29 DIAGNOSIS — A0472 Enterocolitis due to Clostridium difficile, not specified as recurrent: Secondary | ICD-10-CM | POA: Diagnosis not present

## 2018-03-29 DIAGNOSIS — D449 Neoplasm of uncertain behavior of unspecified endocrine gland: Secondary | ICD-10-CM | POA: Diagnosis not present

## 2018-03-29 DIAGNOSIS — R0902 Hypoxemia: Secondary | ICD-10-CM | POA: Diagnosis not present

## 2018-03-29 DIAGNOSIS — E871 Hypo-osmolality and hyponatremia: Secondary | ICD-10-CM | POA: Diagnosis not present

## 2018-03-31 DIAGNOSIS — K8689 Other specified diseases of pancreas: Secondary | ICD-10-CM | POA: Diagnosis not present

## 2018-03-31 DIAGNOSIS — R531 Weakness: Secondary | ICD-10-CM | POA: Diagnosis not present

## 2018-03-31 DIAGNOSIS — D638 Anemia in other chronic diseases classified elsewhere: Secondary | ICD-10-CM | POA: Diagnosis not present

## 2018-03-31 DIAGNOSIS — J441 Chronic obstructive pulmonary disease with (acute) exacerbation: Secondary | ICD-10-CM | POA: Diagnosis not present

## 2018-03-31 DIAGNOSIS — I82509 Chronic embolism and thrombosis of unspecified deep veins of unspecified lower extremity: Secondary | ICD-10-CM | POA: Diagnosis not present

## 2018-03-31 DIAGNOSIS — E440 Moderate protein-calorie malnutrition: Secondary | ICD-10-CM | POA: Diagnosis not present

## 2018-04-03 DIAGNOSIS — I82509 Chronic embolism and thrombosis of unspecified deep veins of unspecified lower extremity: Secondary | ICD-10-CM | POA: Diagnosis not present

## 2018-04-03 DIAGNOSIS — R531 Weakness: Secondary | ICD-10-CM | POA: Diagnosis not present

## 2018-04-03 DIAGNOSIS — E440 Moderate protein-calorie malnutrition: Secondary | ICD-10-CM | POA: Diagnosis not present

## 2018-04-03 DIAGNOSIS — D638 Anemia in other chronic diseases classified elsewhere: Secondary | ICD-10-CM | POA: Diagnosis not present

## 2018-04-03 DIAGNOSIS — J441 Chronic obstructive pulmonary disease with (acute) exacerbation: Secondary | ICD-10-CM | POA: Diagnosis not present

## 2018-04-03 DIAGNOSIS — K8689 Other specified diseases of pancreas: Secondary | ICD-10-CM | POA: Diagnosis not present

## 2018-04-05 DIAGNOSIS — R531 Weakness: Secondary | ICD-10-CM | POA: Diagnosis not present

## 2018-04-05 DIAGNOSIS — K8689 Other specified diseases of pancreas: Secondary | ICD-10-CM | POA: Diagnosis not present

## 2018-04-05 DIAGNOSIS — D638 Anemia in other chronic diseases classified elsewhere: Secondary | ICD-10-CM | POA: Diagnosis not present

## 2018-04-05 DIAGNOSIS — I82509 Chronic embolism and thrombosis of unspecified deep veins of unspecified lower extremity: Secondary | ICD-10-CM | POA: Diagnosis not present

## 2018-04-05 DIAGNOSIS — E440 Moderate protein-calorie malnutrition: Secondary | ICD-10-CM | POA: Diagnosis not present

## 2018-04-05 DIAGNOSIS — J441 Chronic obstructive pulmonary disease with (acute) exacerbation: Secondary | ICD-10-CM | POA: Diagnosis not present

## 2018-04-07 DIAGNOSIS — N151 Renal and perinephric abscess: Secondary | ICD-10-CM | POA: Diagnosis not present

## 2018-04-07 DIAGNOSIS — R1084 Generalized abdominal pain: Secondary | ICD-10-CM | POA: Diagnosis not present

## 2018-04-10 ENCOUNTER — Inpatient Hospital Stay (HOSPITAL_COMMUNITY): Payer: Medicare Other | Attending: Hematology

## 2018-04-10 ENCOUNTER — Encounter (HOSPITAL_COMMUNITY)
Admission: RE | Admit: 2018-04-10 | Discharge: 2018-04-10 | Disposition: A | Discharge status: Home / Self Care | Payer: Medicare Other | Source: Ambulatory Visit | Attending: Nurse Practitioner | Admitting: Nurse Practitioner

## 2018-04-10 DIAGNOSIS — D649 Anemia, unspecified: Secondary | ICD-10-CM | POA: Diagnosis not present

## 2018-04-10 DIAGNOSIS — C259 Malignant neoplasm of pancreas, unspecified: Secondary | ICD-10-CM | POA: Insufficient documentation

## 2018-04-10 LAB — COMPREHENSIVE METABOLIC PANEL
ALT: 22 U/L (ref 0–44)
ANION GAP: 11 (ref 5–15)
AST: 35 U/L (ref 15–41)
Albumin: 4.2 g/dL (ref 3.5–5.0)
Alkaline Phosphatase: 105 U/L (ref 38–126)
BILIRUBIN TOTAL: 0.9 mg/dL (ref 0.3–1.2)
BUN: 9 mg/dL (ref 8–23)
CO2: 24 mmol/L (ref 22–32)
CREATININE: 0.88 mg/dL (ref 0.44–1.00)
Calcium: 9.1 mg/dL (ref 8.9–10.3)
Chloride: 102 mmol/L (ref 98–111)
GFR calc Af Amer: >60 mL/min (ref >60)
Glucose, Bld: 96 mg/dL (ref 70–99)
Potassium: 3.8 mmol/L (ref 3.5–5.1)
SODIUM: 137 mmol/L (ref 135–145)
TOTAL PROTEIN: 8.3 g/dL — AB (ref 6.5–8.1)

## 2018-04-10 LAB — LACTATE DEHYDROGENASE: LDH: 151 U/L (ref 98–192)

## 2018-04-10 LAB — CBC WITH DIFFERENTIAL/PLATELET
Abs Immature Granulocytes: 0.03 10*3/uL (ref 0.00–0.07)
BASOS PCT: 0 %
Basophils Absolute: 0.1 10*3/uL (ref 0.0–0.1)
EOS ABS: 0.3 10*3/uL (ref 0.0–0.5)
Eosinophils Relative: 2 %
HEMATOCRIT: 42.7 % (ref 36.0–46.0)
Hemoglobin: 12.8 g/dL (ref 12.0–15.0)
IMMATURE GRANULOCYTES: 0 %
LYMPHS ABS: 3.3 10*3/uL (ref 0.7–4.0)
Lymphocytes Relative: 26 %
MCH: 27.4 pg (ref 26.0–34.0)
MCHC: 30 g/dL (ref 30.0–36.0)
MCV: 91.4 fL (ref 80.0–100.0)
Monocytes Absolute: 0.9 10*3/uL (ref 0.1–1.0)
Monocytes Relative: 7 %
NRBC: 0 % (ref 0.0–0.2)
Neutro Abs: 8.1 10*3/uL — ABNORMAL HIGH (ref 1.7–7.7)
Neutrophils Relative %: 65 %
PLATELETS: 412 10*3/uL — AB (ref 150–400)
RBC: 4.67 MIL/uL (ref 3.87–5.11)
RDW: 15.7 % — AB (ref 11.5–15.5)
WBC: 12.7 10*3/uL — AB (ref 4.0–10.5)

## 2018-04-10 LAB — IRON AND TIBC
Iron: 39 ug/dL (ref 28–170)
Saturation Ratios: 14 % (ref 10.4–31.8)
TIBC: 287 ug/dL (ref 250–450)
UIBC: 248 ug/dL

## 2018-04-10 LAB — FERRITIN: FERRITIN: 462 ng/mL — AB (ref 11–307)

## 2018-04-10 LAB — VITAMIN B12: Vitamin B-12: 433 pg/mL (ref 180–914)

## 2018-04-10 LAB — FOLATE: Folate: 7.7 ng/mL (ref >5.9)

## 2018-04-10 MED ORDER — FLUDEOXYGLUCOSE F - 18 (FDG) INJECTION
8.6500 | Freq: Once | INTRAVENOUS | Status: AC | PRN
  Administered 2018-04-10: 8.65 via INTRAVENOUS

## 2018-04-11 DIAGNOSIS — R531 Weakness: Secondary | ICD-10-CM | POA: Diagnosis not present

## 2018-04-11 DIAGNOSIS — K8689 Other specified diseases of pancreas: Secondary | ICD-10-CM | POA: Diagnosis not present

## 2018-04-11 DIAGNOSIS — J441 Chronic obstructive pulmonary disease with (acute) exacerbation: Secondary | ICD-10-CM | POA: Diagnosis not present

## 2018-04-11 DIAGNOSIS — I82509 Chronic embolism and thrombosis of unspecified deep veins of unspecified lower extremity: Secondary | ICD-10-CM | POA: Diagnosis not present

## 2018-04-11 DIAGNOSIS — D638 Anemia in other chronic diseases classified elsewhere: Secondary | ICD-10-CM | POA: Diagnosis not present

## 2018-04-11 DIAGNOSIS — E440 Moderate protein-calorie malnutrition: Secondary | ICD-10-CM | POA: Diagnosis not present

## 2018-04-11 LAB — CANCER ANTIGEN 19-9: CA 19-9: 20 U/mL (ref 0–35)

## 2018-04-12 DIAGNOSIS — K8689 Other specified diseases of pancreas: Secondary | ICD-10-CM | POA: Diagnosis not present

## 2018-04-12 DIAGNOSIS — J441 Chronic obstructive pulmonary disease with (acute) exacerbation: Secondary | ICD-10-CM | POA: Diagnosis not present

## 2018-04-12 DIAGNOSIS — R531 Weakness: Secondary | ICD-10-CM | POA: Diagnosis not present

## 2018-04-12 DIAGNOSIS — I82509 Chronic embolism and thrombosis of unspecified deep veins of unspecified lower extremity: Secondary | ICD-10-CM | POA: Diagnosis not present

## 2018-04-12 DIAGNOSIS — E440 Moderate protein-calorie malnutrition: Secondary | ICD-10-CM | POA: Diagnosis not present

## 2018-04-12 DIAGNOSIS — D638 Anemia in other chronic diseases classified elsewhere: Secondary | ICD-10-CM | POA: Diagnosis not present

## 2018-04-17 ENCOUNTER — Other Ambulatory Visit: Payer: Self-pay

## 2018-04-17 ENCOUNTER — Encounter (HOSPITAL_COMMUNITY): Payer: Self-pay | Admitting: Hematology

## 2018-04-17 ENCOUNTER — Inpatient Hospital Stay (HOSPITAL_BASED_OUTPATIENT_CLINIC_OR_DEPARTMENT_OTHER): Payer: Medicare Other | Admitting: Hematology

## 2018-04-17 VITALS — BP 88/69 | HR 69 | Temp 98.5°F | Resp 14 | Wt 112.4 lb

## 2018-04-17 DIAGNOSIS — D649 Anemia, unspecified: Secondary | ICD-10-CM | POA: Diagnosis not present

## 2018-04-17 DIAGNOSIS — C259 Malignant neoplasm of pancreas, unspecified: Secondary | ICD-10-CM | POA: Diagnosis not present

## 2018-04-17 NOTE — Assessment & Plan Note (Signed)
1.  Stage IIb (T3N1) pancreatic adenocarcinoma: -Status post Whipple surgery on 06/21/2013 at [facility] - Did not receive adjuvant chemotherapy due to complicated postoperative course. -I have reviewed the results of the CT scan of the abdomen and pelvis with contrast dated 06/21/2016 which shows post Whipple procedure changes without evidence of recurrence.  Very small volume of gas within the bladder. -CA 19-9 went up to 194, from a normal level 3 months before.  This is very concerning for recurrence. - PET CT scan from 07/01/2017 did not show any evidence of recurrence in the pancreatic area. There is uptake in the left tonsil.  Patient was ex-smoker who quit 10 years ago.  Throat examination did not reveal any lesions. -Dr. Benjamine Mola has seen her on 08/08/2017 and did flexible fiberoptic laryngoscopy which did not reveal any suspicious lesions.  She was given the option of left tonsillectomy versus close follow-up.  She chose follow-up in 3 months. - She was reportedly hospitalized for 6 days in December for UTI at [facility].  She was again hospitalized for 28 days in January 2020 with a UTI and C. difficile.  She apparently developed left kidney hematoma while she was on anticoagulation.  She had a drainage catheter put in, which was later discontinued. - She reportedly lost 17 pounds while she was in the hospital.  She is slowly gaining it back. - We reviewed PET CT scan from 04/10/2018.  There is new abnormal high signal intensity collection along the lateral margin of the left kidney which coincides with her hematoma. - It also showed small focus of attenuated activity in the lateral segment of the left hepatic lobe near the falciform ligament mildly able background hepatic activity.  Persistent mild asymmetric activity in the palatine tonsillar region, left more than right. - I have told her to follow-up with Dr. Benjamine Mola as she missed her appointment when she was hospitalized in January. -She will come back  in 4 months with repeat PET CT scan.  I have reviewed her blood work which showed normal CA 19-9.  2.  Normocytic anemia: -Last Feraheme was on 07/13/2017 and 07/20/2017. -Hemoglobin today is within normal limits.  Ferritin is also normal.   3.  Diarrhea: -She is taking Creon 36,000 units 3 times a day with meals. -Her diarrhea is stable at this time.

## 2018-04-17 NOTE — Progress Notes (Signed)
Maureen Vaughn, [place] [address]   CLINIC:  Medical Oncology/Hematology  PCP:  Caryl Bis, MD [place] [address] [phone]   REASON FOR VISIT:  Follow-up for pancreatic cancer and tonsillar nodule  CURRENT THERAPY: Observation   BRIEF ONCOLOGIC HISTORY:    Adenocarcinoma of pancreas ([place])   05/23/2013 Tumor Marker    Patient's tumor was tested for the following markers: CA 19-9. Results of the tumor marker test revealed 86.    05/24/2013 Tumor Marker    Patient's tumor was tested for the following markers: CEA. Results of the tumor marker test revealed 5.    06/18/2013 Procedure    Malignant distal CBD stricture in distal 2-3cm of CBD- 10 x 63mm covered metal stent placed       06/21/2013 Procedure    Diagnostic laparoscopy, open pylorus preserving pancreaticoduodenectomy, omentectomy by Dr. 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 Patient status post Whipple procedure without evidence of pancreatic carcinoma recurrence or metastasis. 2. No surgical complication identified. 3. Bilateral pars defects with grade 1 anterolisthesis at L5-S1.    06/21/2016 Imaging    Ct abd/pelvis- Stable postop changes from Whipple procedure. No evidence of recurrent or metastatic carcinoma within abdomen.  Incidentally noted aortic atherosclerosis and bilateral L5 pars defects, with degenerative disc disease and grade 2 anterolisthesis at L5-S1      CANCER STAGING: Cancer Staging Adenocarcinoma of pancreas  [facility]) Staging form: Pancreas, AJCC 7th Edition - Pathologic stage from 06/26/2013: Stage IIB (T3, N1, cM0) - Signed by Baird Cancer, PA-C on 12/23/2015    INTERVAL HISTORY:  Maureen Vaughn 68 y.o. female returns for routine follow-up of pancreatic cancer and tonsillar nodule. She is here today with family. She states that she had recent hospital stay.  She states that she lost weight while she was in the hospital. Denies any nausea, vomiting, or diarrhea. Denies any new pains. Had not noticed any recent bleeding such as epistaxis, hematuria or hematochezia. Denies recent chest pain on exertion, shortness of breath on minimal exertion, pre-syncopal episodes, or palpitations. Denies any numbness or tingling in hands or feet. Denies any recent fevers, infections, or recent hospitalizations. Patient reports appetite at 50% and energy level at 25%.      REVIEW OF SYSTEMS:  Review of Systems  Constitutional: Positive for fatigue and unexpected weight change.  Respiratory: Positive for shortness of breath.   Gastrointestinal: Positive for diarrhea and nausea.  Skin: Positive for itching.  Neurological: Positive for dizziness.  Psychiatric/Behavioral: Positive for depression.  Weight loss while in the hospital.    PAST MEDICAL/SURGICAL HISTORY:  Past Medical History:  Diagnosis Date  . Adenocarcinoma of pancreas ([place]) 12/23/2015  . Cancer [facility])    pancreatic  . Chronic abdominal pain   . Chronic diarrhea   . Depression   . Falls frequently   . Fracture of right humerus   . Hip fracture, right ([place])   . Hypotension    Past Surgical History:  Procedure Laterality Date  . GASTROSTOMY TUBE PLACEMENT  SOCIAL HISTORY:  Social History   Socioeconomic History  . Marital status: Legally Separated    Spouse name: Not on file  . Number of children: Not on file  . Years of education: Not on file  . Highest education level: Not on file  Occupational History  . Not on file   Social Needs  . Financial resource strain: Not on file  . Food insecurity:    Worry: Not on file    Inability: Not on file  . Transportation needs:    Medical: Not on file    Non-medical: Not on file  Tobacco Use  . Smoking status: Former Smoker    Packs/day: 2.00    Years: 35.00    Pack years: 70.00    Last attempt to quit: 12/23/2010    Years since quitting: 7.3  . Smokeless tobacco: Never Used  Substance and Sexual Activity  . 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 Drug use: No  . Sexual activity: Not on file  Lifestyle  . Physical activity:    Days per week: Not on file    Minutes per session: Not on file  . Stress: Not on file  Relationships  . Social connections:    Talks on phone: Not on file    Gets together: Not on file    Attends religious service: Not on file    Active member of club or organization: Not on file    Attends meetings of clubs or organizations: Not on file    Relationship status: Not on file  . Intimate partner violence:    Fear of current or ex partner: Not on file    Emotionally abused: Not on file    Physically abused: Not on file    Forced sexual activity: Not on file  Other Topics Concern  . Not on file  Social History Narrative  . Not on file    FAMILY HISTORY:  Family History  Adopted: Yes    CURRENT MEDICATIONS:  Outpatient Encounter Medications as of 04/17/2018  Medication Sig Note  . Cranberry-Vitamin C-Probiotic (AZO CRANBERRY) 250-30 MG TABS Take 1 tablet by mouth daily.   Marland Kitchen CREON 36000 units CPEP capsule TAKE ONE CAPSULE BY MOUTH THREE TIMES DAILY WITH MEALS.   . diazepam (VALIUM) 5 MG tablet Take 5 mg by mouth 3 (three) times daily.  12/23/2015: Received from: [place]: Take 1 tablet by mouth 3 (three) times a day as needed.  . DULoxetine (CYMBALTA) 60 MG capsule Take 60 mg by mouth daily.   Marland Kitchen ibandronate (BONIVA) 150 MG tablet Take by mouth every 30 (thirty)  days.  12/23/2015: Received from: [place]: Take 150 mg by mouth every 30 (thirty) days. Take one 150 mg tablet monthly on the same day each month. Swallow whole tablet with 6-8 oz of plain water only. Don't eat, drink (except for water), or take oth  . metoprolol succinate (TOPROL-XL) 25 MG 24 hr tablet Take 12.5 mg by mouth daily.    Marland Kitchen omeprazole (PRILOSEC) 40 MG capsule Take 40 mg by mouth daily. 12/23/2015: Received from: External Pharmacy Received Sig: TAKE ONE CAPSULE BY MOUTH DAILY  . ondansetron (ZOFRAN) 4 MG tablet Take 4 mg by mouth every 8 (eight) hours as needed. 12/23/2015: Received from: External Pharmacy Received Sig: TAKE ONE TABLET BY MOUTH EVERY 8 HOURS AS NEEDED  . potassium chloride (K-DUR,KLOR-CON) 10 MEQ tablet  Take 10 mEq by mouth daily.  12/23/2015: Received from: [place]: Take 1 tablet by mouth daily.  . SYMBICORT 160-4.5 MCG/ACT inhaler Pt is not taking-does not need it   . traMADol (ULTRAM) 50 MG tablet 50 mg 2 (two) times daily.    . [DISCONTINUED] nitrofurantoin, macrocrystal-monohydrate, (MACROBID) 100 MG capsule Take 100 mg by mouth 2 (two) times daily.   . [DISCONTINUED] predniSONE (DELTASONE) 50 MG tablet Take 50 mg by mouth daily.   . [DISCONTINUED] XARELTO 20 MG TABS tablet Take 20 mg daily by mouth.    No facility-administered encounter medications on file as of 04/17/2018.     ALLERGIES:  No Known Allergies   PHYSICAL EXAM:  ECOG Performance status: 1  Vitals:   04/17/18 1431  BP: (!) 88/69  Pulse: 69  Resp: 14  Temp: 98.5 F (36.9 C)  SpO2: 99%   Filed Weights   04/17/18 1431  Weight: 112 lb 6.4 oz (51 kg)    Physical Exam Constitutional:      Appearance: Normal appearance.  Cardiovascular:     Rate and Rhythm: Normal rate and regular rhythm.  Pulmonary:     Effort: Pulmonary effort is normal.     Breath sounds: Normal breath sounds.  Abdominal:     General: Bowel sounds are normal. There is no  distension.     Palpations: Abdomen is soft. There is no mass.  Musculoskeletal:        General: No swelling.  Skin:    General: Skin is warm.  Neurological:     General: No focal deficit present.     Mental Status: She is alert and oriented to person, place, and time.  Psychiatric:        Mood and Affect: Mood normal.        Behavior: Behavior normal.      LABORATORY DATA:  I have reviewed the labs as listed.  CBC    Component Value Date/Time   WBC 12.7 (H) 04/10/2018 1542   RBC 4.67 04/10/2018 1542   HGB 12.8 04/10/2018 1542   HCT 42.7 04/10/2018 1542   PLT 412 (H) 04/10/2018 1542   MCV 91.4 04/10/2018 1542   MCH 27.4 04/10/2018 1542   MCHC 30.0 04/10/2018 1542   RDW 15.7 (H) 04/10/2018 1542   LYMPHSABS 3.3 04/10/2018 1542   MONOABS 0.9 04/10/2018 1542   EOSABS 0.3 04/10/2018 1542   BASOSABS 0.1 04/10/2018 1542   CMP Latest Ref Rng & Units 04/10/2018 01/09/2018 09/02/2017  Glucose 70 - 99 mg/dL 96 90 89  BUN 8 - 23 mg/dL 9 16 16   Creatinine 0.44 - 1.00 mg/dL 0.88 1.30(H) 1.03(H)  Sodium 135 - 145 mmol/L 137 137 137  Potassium 3.5 - 5.1 mmol/L 3.8 4.5 4.3  Chloride 98 - 111 mmol/L 102 106 104  CO2 22 - 32 mmol/L 24 23 26   Calcium 8.9 - 10.3 mg/dL 9.1 9.3 9.0  Total Protein 6.5 - 8.1 g/dL 8.3(H) 8.5(H) 7.2  Total Bilirubin 0.3 - 1.2 mg/dL 0.9 1.0 0.5  Alkaline Phos 38 - 126 U/L 105 101 101  AST 15 - 41 U/L 35 29 22  ALT 0 - 44 U/L 22 22 16        DIAGNOSTIC IMAGING:  I have independently reviewed the scans and discussed with the patient.   I have reviewed Francene Finders, NP's note and agree with the documentation.  I personally performed a face-to-face visit, made revisions and my assessment and plan is  as follows.    ASSESSMENT & PLAN:   Adenocarcinoma of pancreas ([place]) 1.  Stage IIb (T3N1) pancreatic adenocarcinoma: -Status post Whipple surgery on 06/21/2013 at [place] - Did not receive adjuvant chemotherapy due to complicated postoperative  course. -I have reviewed the results of the CT scan of the abdomen and pelvis with contrast dated 06/21/2016 which shows post Whipple procedure changes without evidence of recurrence.  Very small volume of gas within the bladder. -CA 19-9 went up to 194, from a normal level 3 months before.  This is very concerning for recurrence. - PET CT scan from 07/01/2017 did not show any evidence of recurrence in the pancreatic area. There is uptake in the left tonsil.  Patient was ex-smoker who quit 10 years ago.  Throat examination did not reveal any lesions. -Dr. Benjamine Mola has seen her on 08/08/2017 and did flexible fiberoptic laryngoscopy which did not reveal any suspicious lesions.  She was given the option of left tonsillectomy versus close follow-up.  She chose follow-up in 3 months. - She was reportedly hospitalized for 6 days in December for UTI at [facility].  She was again hospitalized for 28 days in January 2020 with a UTI and C. difficile.  She apparently developed left kidney hematoma while she was on anticoagulation.  She had a drainage catheter put in, which was later discontinued. - She reportedly lost 17 pounds while she was in the hospital.  She is slowly gaining it back. - We reviewed PET CT scan from 04/10/2018.  There is new abnormal high signal intensity collection along the lateral margin of the left kidney which coincides with her hematoma. - It also showed small focus of attenuated activity in the lateral segment of the left hepatic lobe near the falciform ligament mildly able background hepatic activity.  Persistent mild asymmetric activity in the palatine tonsillar region, left more than right. - I have told her to follow-up with Dr. Benjamine Mola as she missed her appointment when she was hospitalized in January. -She will come back in 4 months with repeat PET CT scan.  I have reviewed her blood work which showed normal CA 19-9.  2.  Normocytic anemia: -Last Feraheme was on 07/13/2017 and 07/20/2017. -Hemoglobin  today is within normal limits.  Ferritin is also normal.   3.  Diarrhea: -She is taking Creon 36,000 units 3 times a day with meals. -Her diarrhea is stable at this time.      Orders placed this encounter:  Orders Placed This Encounter  Procedures  . NM PET Image Restag (PS) Skull Base To Thigh  . CBC with Differential/Platelet  . Comprehensive metabolic panel  . Cancer antigen 19-9      Derek Jack, MD [place] [phone]

## 2018-04-17 NOTE — Patient Instructions (Addendum)
[place] at [facility]  Discharge Instructions:  You saw Dr. Delton Coombes today. We will see you back in 4 months for a PET scan, labs and follow up. Please follow up with ENT and Dermatology  _______________________________________________________________  Thank you for choosing [place] at [facility] to provide your oncology and hematology care.  To afford each patient quality time with our providers, please arrive at least 15 minutes before your scheduled appointment.  You need to re-schedule your appointment if you arrive 10 or more minutes late.  We strive to give you quality time with our providers, and arriving late affects you and other patients whose appointments are after yours.  Also, if you no show three or more times for appointments you may be dismissed from the clinic.  Again, thank you for choosing [place] at [place] hope is that these requests will allow you access to exceptional care and in a timely manner. _______________________________________________________________  If you have questions after your visit, please contact our office at [phone] between the hours of 8:30 a.m. and 5:00 p.m. Voicemails left after 4:30 p.m. will not be returned until the following business day. _______________________________________________________________  For prescription refill requests, have your pharmacy contact our office. _______________________________________________________________  Recommendations made by the consultant and any test results will be sent to your referring physician. _______________________________________________________________

## 2018-04-18 DIAGNOSIS — I82509 Chronic embolism and thrombosis of unspecified deep veins of unspecified lower extremity: Secondary | ICD-10-CM | POA: Diagnosis not present

## 2018-04-18 DIAGNOSIS — D638 Anemia in other chronic diseases classified elsewhere: Secondary | ICD-10-CM | POA: Diagnosis not present

## 2018-04-18 DIAGNOSIS — K8689 Other specified diseases of pancreas: Secondary | ICD-10-CM | POA: Diagnosis not present

## 2018-04-18 DIAGNOSIS — J441 Chronic obstructive pulmonary disease with (acute) exacerbation: Secondary | ICD-10-CM | POA: Diagnosis not present

## 2018-04-18 DIAGNOSIS — E440 Moderate protein-calorie malnutrition: Secondary | ICD-10-CM | POA: Diagnosis not present

## 2018-04-18 DIAGNOSIS — R531 Weakness: Secondary | ICD-10-CM | POA: Diagnosis not present

## 2018-04-20 ENCOUNTER — Other Ambulatory Visit (HOSPITAL_COMMUNITY): Payer: Self-pay | Admitting: Hematology

## 2018-04-20 DIAGNOSIS — C259 Malignant neoplasm of pancreas, unspecified: Secondary | ICD-10-CM

## 2018-04-21 DIAGNOSIS — A4151 Sepsis due to Escherichia coli [E. coli]: Secondary | ICD-10-CM | POA: Diagnosis not present

## 2018-04-24 ENCOUNTER — Ambulatory Visit (INDEPENDENT_AMBULATORY_CARE_PROVIDER_SITE_OTHER): Payer: Medicare Other | Admitting: Otolaryngology

## 2018-04-25 DIAGNOSIS — Z8619 Personal history of other infectious and parasitic diseases: Secondary | ICD-10-CM | POA: Diagnosis not present

## 2018-04-25 DIAGNOSIS — Z96641 Presence of right artificial hip joint: Secondary | ICD-10-CM | POA: Diagnosis not present

## 2018-04-25 DIAGNOSIS — J441 Chronic obstructive pulmonary disease with (acute) exacerbation: Secondary | ICD-10-CM | POA: Diagnosis not present

## 2018-04-25 DIAGNOSIS — Z8673 Personal history of transient ischemic attack (TIA), and cerebral infarction without residual deficits: Secondary | ICD-10-CM | POA: Diagnosis not present

## 2018-04-25 DIAGNOSIS — Z87440 Personal history of urinary (tract) infections: Secondary | ICD-10-CM | POA: Diagnosis not present

## 2018-04-25 DIAGNOSIS — D638 Anemia in other chronic diseases classified elsewhere: Secondary | ICD-10-CM | POA: Diagnosis not present

## 2018-04-25 DIAGNOSIS — E440 Moderate protein-calorie malnutrition: Secondary | ICD-10-CM | POA: Diagnosis not present

## 2018-04-25 DIAGNOSIS — Z8507 Personal history of malignant neoplasm of pancreas: Secondary | ICD-10-CM | POA: Diagnosis not present

## 2018-04-25 DIAGNOSIS — I82509 Chronic embolism and thrombosis of unspecified deep veins of unspecified lower extremity: Secondary | ICD-10-CM | POA: Diagnosis not present

## 2018-04-25 DIAGNOSIS — Z79899 Other long term (current) drug therapy: Secondary | ICD-10-CM | POA: Diagnosis not present

## 2018-04-25 DIAGNOSIS — M810 Age-related osteoporosis without current pathological fracture: Secondary | ICD-10-CM | POA: Diagnosis not present

## 2018-04-25 DIAGNOSIS — K8689 Other specified diseases of pancreas: Secondary | ICD-10-CM | POA: Diagnosis not present

## 2018-04-25 DIAGNOSIS — R531 Weakness: Secondary | ICD-10-CM | POA: Diagnosis not present

## 2018-05-05 DIAGNOSIS — J441 Chronic obstructive pulmonary disease with (acute) exacerbation: Secondary | ICD-10-CM | POA: Diagnosis not present

## 2018-05-05 DIAGNOSIS — R531 Weakness: Secondary | ICD-10-CM | POA: Diagnosis not present

## 2018-05-05 DIAGNOSIS — D638 Anemia in other chronic diseases classified elsewhere: Secondary | ICD-10-CM | POA: Diagnosis not present

## 2018-05-05 DIAGNOSIS — K8689 Other specified diseases of pancreas: Secondary | ICD-10-CM | POA: Diagnosis not present

## 2018-05-05 DIAGNOSIS — E440 Moderate protein-calorie malnutrition: Secondary | ICD-10-CM | POA: Diagnosis not present

## 2018-05-22 DIAGNOSIS — R300 Dysuria: Secondary | ICD-10-CM | POA: Diagnosis not present

## 2018-05-22 DIAGNOSIS — R109 Unspecified abdominal pain: Secondary | ICD-10-CM | POA: Diagnosis not present

## 2018-05-23 DIAGNOSIS — B370 Candidal stomatitis: Secondary | ICD-10-CM | POA: Diagnosis not present

## 2018-05-23 DIAGNOSIS — F3340 Major depressive disorder, recurrent, in remission, unspecified: Secondary | ICD-10-CM | POA: Diagnosis not present

## 2018-05-23 DIAGNOSIS — N390 Urinary tract infection, site not specified: Secondary | ICD-10-CM | POA: Diagnosis not present

## 2018-05-23 DIAGNOSIS — Z66 Do not resuscitate: Secondary | ICD-10-CM | POA: Diagnosis not present

## 2018-05-23 DIAGNOSIS — N151 Renal and perinephric abscess: Secondary | ICD-10-CM | POA: Diagnosis not present

## 2018-05-23 DIAGNOSIS — E871 Hypo-osmolality and hyponatremia: Secondary | ICD-10-CM | POA: Diagnosis not present

## 2018-05-23 DIAGNOSIS — K521 Toxic gastroenteritis and colitis: Secondary | ICD-10-CM | POA: Diagnosis not present

## 2018-05-23 DIAGNOSIS — R1012 Left upper quadrant pain: Secondary | ICD-10-CM | POA: Diagnosis not present

## 2018-05-23 DIAGNOSIS — N281 Cyst of kidney, acquired: Secondary | ICD-10-CM | POA: Diagnosis not present

## 2018-05-24 DIAGNOSIS — Z8673 Personal history of transient ischemic attack (TIA), and cerebral infarction without residual deficits: Secondary | ICD-10-CM | POA: Diagnosis not present

## 2018-05-24 DIAGNOSIS — N151 Renal and perinephric abscess: Secondary | ICD-10-CM | POA: Diagnosis present

## 2018-05-24 DIAGNOSIS — B370 Candidal stomatitis: Secondary | ICD-10-CM | POA: Diagnosis not present

## 2018-05-24 DIAGNOSIS — K521 Toxic gastroenteritis and colitis: Secondary | ICD-10-CM | POA: Diagnosis not present

## 2018-05-24 DIAGNOSIS — E876 Hypokalemia: Secondary | ICD-10-CM | POA: Diagnosis not present

## 2018-05-24 DIAGNOSIS — Z86718 Personal history of other venous thrombosis and embolism: Secondary | ICD-10-CM | POA: Diagnosis not present

## 2018-05-24 DIAGNOSIS — N390 Urinary tract infection, site not specified: Secondary | ICD-10-CM | POA: Diagnosis not present

## 2018-05-24 DIAGNOSIS — F3340 Major depressive disorder, recurrent, in remission, unspecified: Secondary | ICD-10-CM | POA: Diagnosis present

## 2018-05-24 DIAGNOSIS — B9620 Unspecified Escherichia coli [E. coli] as the cause of diseases classified elsewhere: Secondary | ICD-10-CM | POA: Diagnosis present

## 2018-05-24 DIAGNOSIS — Z8507 Personal history of malignant neoplasm of pancreas: Secondary | ICD-10-CM | POA: Diagnosis not present

## 2018-05-24 DIAGNOSIS — T361X5A Adverse effect of cephalosporins and other beta-lactam antibiotics, initial encounter: Secondary | ICD-10-CM | POA: Diagnosis not present

## 2018-05-24 DIAGNOSIS — K8689 Other specified diseases of pancreas: Secondary | ICD-10-CM | POA: Diagnosis present

## 2018-05-24 DIAGNOSIS — I10 Essential (primary) hypertension: Secondary | ICD-10-CM | POA: Diagnosis present

## 2018-05-24 DIAGNOSIS — K219 Gastro-esophageal reflux disease without esophagitis: Secondary | ICD-10-CM | POA: Diagnosis present

## 2018-05-24 DIAGNOSIS — Z90411 Acquired partial absence of pancreas: Secondary | ICD-10-CM | POA: Diagnosis not present

## 2018-05-24 DIAGNOSIS — Z87891 Personal history of nicotine dependence: Secondary | ICD-10-CM | POA: Diagnosis not present

## 2018-05-24 DIAGNOSIS — D649 Anemia, unspecified: Secondary | ICD-10-CM | POA: Diagnosis present

## 2018-05-24 DIAGNOSIS — Z96641 Presence of right artificial hip joint: Secondary | ICD-10-CM | POA: Diagnosis present

## 2018-05-24 DIAGNOSIS — J449 Chronic obstructive pulmonary disease, unspecified: Secondary | ICD-10-CM | POA: Diagnosis present

## 2018-05-24 DIAGNOSIS — M810 Age-related osteoporosis without current pathological fracture: Secondary | ICD-10-CM | POA: Diagnosis present

## 2018-05-24 DIAGNOSIS — E871 Hypo-osmolality and hyponatremia: Secondary | ICD-10-CM | POA: Diagnosis present

## 2018-05-24 DIAGNOSIS — Z66 Do not resuscitate: Secondary | ICD-10-CM | POA: Diagnosis present

## 2018-05-24 DIAGNOSIS — Z87440 Personal history of urinary (tract) infections: Secondary | ICD-10-CM | POA: Diagnosis not present

## 2018-05-24 DIAGNOSIS — F419 Anxiety disorder, unspecified: Secondary | ICD-10-CM | POA: Diagnosis present

## 2018-06-02 DIAGNOSIS — N151 Renal and perinephric abscess: Secondary | ICD-10-CM | POA: Diagnosis not present

## 2018-06-02 DIAGNOSIS — J90 Pleural effusion, not elsewhere classified: Secondary | ICD-10-CM | POA: Diagnosis not present

## 2018-06-05 DIAGNOSIS — R7881 Bacteremia: Secondary | ICD-10-CM | POA: Diagnosis not present

## 2018-06-05 DIAGNOSIS — K750 Abscess of liver: Secondary | ICD-10-CM | POA: Diagnosis not present

## 2018-06-12 DIAGNOSIS — R891 Abnormal level of hormones in specimens from other organs, systems and tissues: Secondary | ICD-10-CM | POA: Diagnosis not present

## 2018-06-12 DIAGNOSIS — K8681 Exocrine pancreatic insufficiency: Secondary | ICD-10-CM | POA: Diagnosis not present

## 2018-06-12 DIAGNOSIS — N390 Urinary tract infection, site not specified: Secondary | ICD-10-CM | POA: Diagnosis not present

## 2018-06-12 DIAGNOSIS — Z792 Long term (current) use of antibiotics: Secondary | ICD-10-CM | POA: Diagnosis not present

## 2018-06-13 DIAGNOSIS — N151 Renal and perinephric abscess: Secondary | ICD-10-CM | POA: Diagnosis not present

## 2018-06-19 DIAGNOSIS — R300 Dysuria: Secondary | ICD-10-CM | POA: Diagnosis not present

## 2018-06-19 DIAGNOSIS — E876 Hypokalemia: Secondary | ICD-10-CM | POA: Diagnosis not present

## 2018-06-19 DIAGNOSIS — K5700 Diverticulitis of small intestine with perforation and abscess without bleeding: Secondary | ICD-10-CM | POA: Diagnosis not present

## 2018-06-19 DIAGNOSIS — N189 Chronic kidney disease, unspecified: Secondary | ICD-10-CM | POA: Diagnosis not present

## 2018-06-19 DIAGNOSIS — K219 Gastro-esophageal reflux disease without esophagitis: Secondary | ICD-10-CM | POA: Diagnosis not present

## 2018-06-19 DIAGNOSIS — Z978 Presence of other specified devices: Secondary | ICD-10-CM | POA: Diagnosis not present

## 2018-06-19 DIAGNOSIS — F331 Major depressive disorder, recurrent, moderate: Secondary | ICD-10-CM | POA: Diagnosis not present

## 2018-06-19 DIAGNOSIS — Z452 Encounter for adjustment and management of vascular access device: Secondary | ICD-10-CM | POA: Diagnosis not present

## 2018-06-19 DIAGNOSIS — R7881 Bacteremia: Secondary | ICD-10-CM | POA: Diagnosis not present

## 2018-06-19 DIAGNOSIS — I10 Essential (primary) hypertension: Secondary | ICD-10-CM | POA: Diagnosis not present

## 2018-06-19 DIAGNOSIS — E871 Hypo-osmolality and hyponatremia: Secondary | ICD-10-CM | POA: Diagnosis not present

## 2018-06-19 DIAGNOSIS — N151 Renal and perinephric abscess: Secondary | ICD-10-CM | POA: Diagnosis not present

## 2018-06-19 DIAGNOSIS — R748 Abnormal levels of other serum enzymes: Secondary | ICD-10-CM | POA: Diagnosis not present

## 2018-06-22 DIAGNOSIS — E876 Hypokalemia: Secondary | ICD-10-CM | POA: Diagnosis not present

## 2018-06-22 DIAGNOSIS — K219 Gastro-esophageal reflux disease without esophagitis: Secondary | ICD-10-CM | POA: Diagnosis not present

## 2018-06-22 DIAGNOSIS — M1612 Unilateral primary osteoarthritis, left hip: Secondary | ICD-10-CM | POA: Diagnosis not present

## 2018-06-22 DIAGNOSIS — K8681 Exocrine pancreatic insufficiency: Secondary | ICD-10-CM | POA: Diagnosis not present

## 2018-06-22 DIAGNOSIS — I82891 Chronic embolism and thrombosis of other specified veins: Secondary | ICD-10-CM | POA: Diagnosis not present

## 2018-06-22 DIAGNOSIS — F331 Major depressive disorder, recurrent, moderate: Secondary | ICD-10-CM | POA: Diagnosis not present

## 2018-06-22 DIAGNOSIS — Z6822 Body mass index (BMI) 22.0-22.9, adult: Secondary | ICD-10-CM | POA: Diagnosis not present

## 2018-06-22 DIAGNOSIS — I10 Essential (primary) hypertension: Secondary | ICD-10-CM | POA: Diagnosis not present

## 2018-06-27 DIAGNOSIS — N151 Renal and perinephric abscess: Secondary | ICD-10-CM | POA: Diagnosis not present

## 2018-06-27 DIAGNOSIS — R300 Dysuria: Secondary | ICD-10-CM | POA: Diagnosis not present

## 2018-06-27 DIAGNOSIS — R109 Unspecified abdominal pain: Secondary | ICD-10-CM | POA: Diagnosis not present

## 2018-06-27 DIAGNOSIS — I700 Atherosclerosis of aorta: Secondary | ICD-10-CM | POA: Diagnosis not present

## 2018-06-27 DIAGNOSIS — K219 Gastro-esophageal reflux disease without esophagitis: Secondary | ICD-10-CM | POA: Diagnosis not present

## 2018-06-29 DIAGNOSIS — N151 Renal and perinephric abscess: Secondary | ICD-10-CM | POA: Diagnosis not present

## 2018-06-29 DIAGNOSIS — J449 Chronic obstructive pulmonary disease, unspecified: Secondary | ICD-10-CM | POA: Diagnosis not present

## 2018-08-14 ENCOUNTER — Other Ambulatory Visit: Payer: Self-pay

## 2018-08-14 ENCOUNTER — Inpatient Hospital Stay (HOSPITAL_COMMUNITY): Payer: Medicare Other | Attending: Hematology

## 2018-08-14 ENCOUNTER — Ambulatory Visit (HOSPITAL_COMMUNITY)
Admission: RE | Admit: 2018-08-14 | Discharge: 2018-08-14 | Disposition: A | Discharge status: Home / Self Care | Payer: Medicare Other | Source: Ambulatory Visit | Attending: Hematology | Admitting: Hematology

## 2018-08-14 ENCOUNTER — Other Ambulatory Visit (HOSPITAL_COMMUNITY): Payer: Medicare Other

## 2018-08-14 DIAGNOSIS — D649 Anemia, unspecified: Secondary | ICD-10-CM | POA: Insufficient documentation

## 2018-08-14 DIAGNOSIS — C259 Malignant neoplasm of pancreas, unspecified: Secondary | ICD-10-CM | POA: Insufficient documentation

## 2018-08-14 DIAGNOSIS — R197 Diarrhea, unspecified: Secondary | ICD-10-CM | POA: Diagnosis not present

## 2018-08-14 LAB — COMPREHENSIVE METABOLIC PANEL
ALT: 19 U/L (ref 0–44)
AST: 24 U/L (ref 15–41)
Albumin: 4.1 g/dL (ref 3.5–5.0)
Alkaline Phosphatase: 102 U/L (ref 38–126)
Anion gap: 7 (ref 5–15)
BUN: 14 mg/dL (ref 8–23)
CO2: 23 mmol/L (ref 22–32)
Calcium: 8 mg/dL — ABNORMAL LOW (ref 8.9–10.3)
Chloride: 110 mmol/L (ref 98–111)
Creatinine, Ser: 0.93 mg/dL (ref 0.44–1.00)
GFR calc Af Amer: >60 mL/min (ref >60)
GFR calc non Af Amer: >60 mL/min (ref >60)
Glucose, Bld: 95 mg/dL (ref 70–99)
Potassium: 4.3 mmol/L (ref 3.5–5.1)
Sodium: 140 mmol/L (ref 135–145)
Total Bilirubin: 0.8 mg/dL (ref 0.3–1.2)
Total Protein: 7.6 g/dL (ref 6.5–8.1)

## 2018-08-14 LAB — CBC WITH DIFFERENTIAL/PLATELET
Abs Immature Granulocytes: 0.02 10*3/uL (ref 0.00–0.07)
Basophils Absolute: 0.1 10*3/uL (ref 0.0–0.1)
Basophils Relative: 1 %
Eosinophils Absolute: 0.2 10*3/uL (ref 0.0–0.5)
Eosinophils Relative: 2 %
HCT: 42.4 % (ref 36.0–46.0)
Hemoglobin: 13 g/dL (ref 12.0–15.0)
Immature Granulocytes: 0 %
Lymphocytes Relative: 30 %
Lymphs Abs: 2.6 10*3/uL (ref 0.7–4.0)
MCH: 26.2 pg (ref 26.0–34.0)
MCHC: 30.7 g/dL (ref 30.0–36.0)
MCV: 85.5 fL (ref 80.0–100.0)
Monocytes Absolute: 0.5 10*3/uL (ref 0.1–1.0)
Monocytes Relative: 5 %
Neutro Abs: 5.3 10*3/uL (ref 1.7–7.7)
Neutrophils Relative %: 62 %
Platelets: 349 10*3/uL (ref 150–400)
RBC: 4.96 MIL/uL (ref 3.87–5.11)
RDW: 17.4 % — ABNORMAL HIGH (ref 11.5–15.5)
WBC: 8.7 10*3/uL (ref 4.0–10.5)
nRBC: 0 % (ref 0.0–0.2)

## 2018-08-14 MED ORDER — FLUDEOXYGLUCOSE F - 18 (FDG) INJECTION
6.6800 | Freq: Once | INTRAVENOUS | Status: AC | PRN
  Administered 2018-08-14: 6.68 via INTRAVENOUS

## 2018-08-16 LAB — CANCER ANTIGEN 19-9: CA 19-9: 13 U/mL (ref 0–35)

## 2018-08-21 ENCOUNTER — Inpatient Hospital Stay (HOSPITAL_BASED_OUTPATIENT_CLINIC_OR_DEPARTMENT_OTHER): Payer: Medicare Other | Admitting: Hematology

## 2018-08-21 ENCOUNTER — Encounter (HOSPITAL_COMMUNITY): Payer: Self-pay | Admitting: Hematology

## 2018-08-21 ENCOUNTER — Other Ambulatory Visit: Payer: Self-pay

## 2018-08-21 VITALS — BP 133/55 | HR 56 | Temp 97.5°F | Resp 18 | Wt 116.2 lb

## 2018-08-21 DIAGNOSIS — R197 Diarrhea, unspecified: Secondary | ICD-10-CM | POA: Diagnosis not present

## 2018-08-21 DIAGNOSIS — D649 Anemia, unspecified: Secondary | ICD-10-CM

## 2018-08-21 DIAGNOSIS — C259 Malignant neoplasm of pancreas, unspecified: Secondary | ICD-10-CM | POA: Diagnosis not present

## 2018-08-21 NOTE — Progress Notes (Signed)
Maureen Vaughn, Maureen Vaughn [id number]   CLINIC:  Medical Oncology/Hematology  PCP:  Caryl Bis, MD [place] [address] [phone]   REASON FOR VISIT:  Follow-up for pancreatic cancer and tonsillar nodule  CURRENT THERAPY: Observation   BRIEF ONCOLOGIC HISTORY:  Oncology History  Adenocarcinoma of pancreas ([place])  05/23/2013 Tumor Marker   Patient's tumor was tested for the following markers: CA 19-9. Results of the tumor marker test revealed 86.   05/24/2013 Tumor Marker   Patient's tumor was tested for the following markers: CEA. Results of the tumor marker test revealed 5.   06/18/2013 Procedure   Malignant distal CBD stricture in distal 2-3cm of CBD- 10 x 75mm covered metal stent placed      06/21/2013 Procedure   Diagnostic laparoscopy, open pylorus preserving pancreaticoduodenectomy, omentectomy by Dr. Maggie Font ([place])     06/26/2013 Cancer Staging   [id number]   06/26/2013 Pathology Results   Adenocarcinoma of distal common bile duct in the head of pancreas, 1.2 cm, moderately differentiated, 1 mitosis/10 HPF, negative margines, perineural invasion identified, peripancreatic soft tissue invasion seen, tumor involves pancreatic acinar tissue and submucosa of duodenum near ampulla, 2/7 positive nodes   01/06/2016 Imaging   CT abd/pelvis- 1. Patient status post Whipple procedure without evidence of pancreatic carcinoma recurrence or metastasis. 2. No surgical complication identified. 3. Bilateral pars defects with grade 1 anterolisthesis at L5-S1.   06/21/2016 Imaging   Ct abd/pelvis- Stable postop changes from Whipple procedure. No evidence of recurrent or metastatic carcinoma within abdomen.  Incidentally noted aortic atherosclerosis and bilateral L5 pars defects, with degenerative disc disease and grade 2 anterolisthesis at L5-S1      CANCER STAGING: Cancer Staging Adenocarcinoma of pancreas [facility]  Staging form: Pancreas, AJCC 7th Edition - Pathologic stage from 06/26/2013: Stage IIB (T3, N1, cM0) - Signed by Baird Cancer, PA-C on 12/23/2015    INTERVAL HISTORY:  Maureen Vaughn 68 y.o. female seen for follow-up of pancreatic cancer as well as anemia.  Denies any bleeding per rectum or melena.  Denies any chest pains or lightheadedness.  No abdominal pains were reported.  She is reportedly taking Creon 3 times a day with meals.  Appetite is 100%.  Energy levels are 50%.  Denies any fevers, night sweats or weight loss.  No recent ER visits or hospitalizations reported.  No hematuria.      REVIEW OF SYSTEMS:  Review of Systems  Gastrointestinal: Positive for diarrhea.  All other systems reviewed and are negative.    PAST MEDICAL/SURGICAL HISTORY:  Past Medical History:  Diagnosis Date  . Adenocarcinoma of pancreas ([place]) 12/23/2015  . Cancer [facility])    pancreatic  . Chronic abdominal pain   . Chronic diarrhea   . Depression   . Falls frequently   . Fracture of right humerus   . Hip fracture, right ([place])   . Hypotension    Past Surgical History:  Procedure Laterality Date  . GASTROSTOMY TUBE PLACEMENT       SOCIAL HISTORY:  Social History   Socioeconomic History  . Marital status: Legally Separated    Spouse name: Not on file  . Number of children: Not on file  . Years of education: Not on file  . Highest education level: Not on file  Occupational History  . Not on file  Social Needs  . Financial resource strain: Not on file  . Food insecurity    Worry:  Not on file    Inability: Not on file  . Transportation needs    Medical: Not on file    Non-medical: Not on file  Tobacco Use  . Smoking status: Former Smoker    Packs/day: 2.00    Years: 35.00    Pack years: 70.00    Quit date: 12/23/2010    Years since quitting: 7.6  . Smokeless tobacco: Never Used  Substance and Sexual Activity  . Alcohol use: No    Comment: H/O of EtOHism drinking 1/2-1 case  of beer per week  . Drug use: No  . Sexual activity: Not on file  Lifestyle  . Physical activity    Days per week: Not on file    Minutes per session: Not on file  . Stress: Not on file  Relationships  . Social Herbalist on phone: Not on file    Gets together: Not on file    Attends religious service: Not on file    Active member of club or organization: Not on file    Attends meetings of clubs or organizations: Not on file    Relationship status: Not on file  . Intimate partner violence    Fear of current or ex partner: Not on file    Emotionally abused: Not on file    Physically abused: Not on file    Forced sexual activity: Not on file  Other Topics Concern  . Not on file  Social History Narrative  . Not on file    FAMILY HISTORY:  Family History  Adopted: Yes    CURRENT MEDICATIONS:  Outpatient Encounter Medications as of 08/21/2018  Medication Sig Note  . Cranberry-Vitamin C-Probiotic (AZO CRANBERRY) 250-30 MG TABS Take 1 tablet by mouth daily.   Marland Kitchen CREON 36000 units CPEP capsule TAKE ONE CAPSULE BY MOUTH THREE TIMES DAILY WITH MEALS.   . diazepam (VALIUM) 5 MG tablet Take 5 mg by mouth 3 (three) times daily.  12/23/2015: Received from: [place]: Take 1 tablet by mouth 3 (three) times a day as needed.  . DULoxetine (CYMBALTA) 60 MG capsule Take 60 mg by mouth daily.   Marland Kitchen ibandronate (BONIVA) 150 MG tablet Take by mouth every 30 (thirty) days.  12/23/2015: Received from: [place]: Take 150 mg by mouth every 30 (thirty) days. Take one 150 mg tablet monthly on the same day each month. Swallow whole tablet with 6-8 oz of plain water only. Don't eat, drink (except for water), or take oth  . metoprolol succinate (TOPROL-XL) 25 MG 24 hr tablet Take 12.5 mg by mouth daily.    Marland Kitchen omeprazole (PRILOSEC) 40 MG capsule Take 40 mg by mouth daily. 12/23/2015: Received from: External Pharmacy Received Sig: TAKE ONE CAPSULE BY MOUTH DAILY  .  ondansetron (ZOFRAN) 4 MG tablet Take 4 mg by mouth every 8 (eight) hours as needed. 12/23/2015: Received from: External Pharmacy Received Sig: TAKE ONE TABLET BY MOUTH EVERY 8 HOURS AS NEEDED  . potassium chloride (K-DUR,KLOR-CON) 10 MEQ tablet Take 10 mEq by mouth daily.  12/23/2015: Received from: [place]: Take 1 tablet by mouth daily.  . SYMBICORT 160-4.5 MCG/ACT inhaler Pt is not taking-does not need it   . traMADol (ULTRAM) 50 MG tablet 50 mg 2 (two) times daily.     No facility-administered encounter medications on file as of 08/21/2018.     ALLERGIES:  No Known Allergies   PHYSICAL EXAM:  ECOG Performance  status: 1  Vitals:   08/21/18 1400  BP: (!) 133/55  Pulse: (!) 56  Resp: 18  Temp: (!) 97.5 F (36.4 C)  SpO2: 100%   Filed Weights   08/21/18 1400  Weight: 116 lb 3 oz (52.7 kg)    Physical Exam Constitutional:      Appearance: Normal appearance.  Cardiovascular:     Rate and Rhythm: Normal rate and regular rhythm.  Pulmonary:     Effort: Pulmonary effort is normal.     Breath sounds: Normal breath sounds.  Abdominal:     General: Bowel sounds are normal. There is no distension.     Palpations: Abdomen is soft. There is no mass.  Musculoskeletal:        General: No swelling.  Skin:    General: Skin is warm.  Neurological:     General: No focal deficit present.     Mental Status: She is alert and oriented to person, place, and time.  Psychiatric:        Mood and Affect: Mood normal.        Behavior: Behavior normal.      LABORATORY DATA:  I have reviewed the labs as listed.  CBC    Component Value Date/Time   WBC 8.7 08/14/2018 1513   RBC 4.96 08/14/2018 1513   HGB 13.0 08/14/2018 1513   HCT 42.4 08/14/2018 1513   PLT 349 08/14/2018 1513   MCV 85.5 08/14/2018 1513   MCH 26.2 08/14/2018 1513   MCHC 30.7 08/14/2018 1513   RDW 17.4 (H) 08/14/2018 1513   LYMPHSABS 2.6 08/14/2018 1513   MONOABS 0.5 08/14/2018 1513   EOSABS  0.2 08/14/2018 1513   BASOSABS 0.1 08/14/2018 1513   CMP Latest Ref Rng & Units 08/14/2018 04/10/2018 01/09/2018  Glucose 70 - 99 mg/dL 95 96 90  BUN 8 - 23 mg/dL 14 9 16   Creatinine 0.44 - 1.00 mg/dL 0.93 0.88 1.30(H)  Sodium 135 - 145 mmol/L 140 137 137  Potassium 3.5 - 5.1 mmol/L 4.3 3.8 4.5  Chloride 98 - 111 mmol/L 110 102 106  CO2 22 - 32 mmol/L 23 24 23   Calcium 8.9 - 10.3 mg/dL 8.0(L) 9.1 9.3  Total Protein 6.5 - 8.1 g/dL 7.6 8.3(H) 8.5(H)  Total Bilirubin 0.3 - 1.2 mg/dL 0.8 0.9 1.0  Alkaline Phos 38 - 126 U/L 102 105 101  AST 15 - 41 U/L 24 35 29  ALT 0 - 44 U/L 19 22 22        DIAGNOSTIC IMAGING:  I have independently reviewed the scans and discussed with the patient.    ASSESSMENT & PLAN:   Adenocarcinoma of pancreas ([place]) 1.  Stage IIb (T3N1) pancreatic adenocarcinoma: -Status post Whipple surgery on 06/21/2013 at [place].  She did not receive any adjuvant chemotherapy due to complicated postoperative course. - Last PET scan from 04/10/2018 showed no abnormal high signal intensity collection along the lateral margin of the left kidney coinciding with her hematoma.  Small focus of attenuated activity in the lateral segment of the left hepatic lobe near the falciform ligament mildly above background hepatic activity.  Persistent mild asymmetric activity in the palatine tonsillar region left more than right.  She was seen by Dr. Benjamine Mola for the tonsillar lesion. - CA 19-9 on 08/14/2018 was 13. - We reviewed results of the PET scan dated 08/14/2018 which did not show any evidence of metastatic disease in the neck, chest, abdomen or pelvis.  Small focus of low-level increased  metabolic activity in the left lobe of the liver, stable to decreased compared to prior exam.  Near complete resolution of previously noted left-sided perinephric fluid collection. - I will see her back in 4 months for follow-up.  I plan to repeat a CT scan of the abdomen to follow-up on the liver lesion.  We  will also repeat another CA 19-9 level.  2.  Normocytic anemia: -Feraheme was on 07/13/2017 and 07/20/2017. -Hemoglobin is normal at 13 today.  Last ferritin was 462 in March.  3.  Diarrhea: -She is taking Creon 36,000 units 3 times a day with meals. -Diarrhea is stable at this time.  Total time spent is 25 minutes with more than 50% of the time spent face-to-face discussing lab results, scan results, counseling and coordination of care.    Orders placed this encounter:  Orders Placed This Encounter  Procedures  . CT Abdomen W Contrast  . CBC with Differential/Platelet  . Comprehensive metabolic panel  . Cancer antigen 19-9      Derek Jack, MD [place] [phone]

## 2018-08-21 NOTE — Assessment & Plan Note (Signed)
1.  Stage IIb (T3N1) pancreatic adenocarcinoma: -Status post Whipple surgery on 06/21/2013 at [place].  She did not receive any adjuvant chemotherapy due to complicated postoperative course. - Last PET scan from 04/10/2018 showed no abnormal high signal intensity collection along the lateral margin of the left kidney coinciding with her hematoma.  Small focus of attenuated activity in the lateral segment of the left hepatic lobe near the falciform ligament mildly above background hepatic activity.  Persistent mild asymmetric activity in the palatine tonsillar region left more than right.  She was seen by Dr. Benjamine Mola for the tonsillar lesion. - CA 19-9 on 08/14/2018 was 13. - We reviewed results of the PET scan dated 08/14/2018 which did not show any evidence of metastatic disease in the neck, chest, abdomen or pelvis.  Small focus of low-level increased metabolic activity in the left lobe of the liver, stable to decreased compared to prior exam.  Near complete resolution of previously noted left-sided perinephric fluid collection. - I will see her back in 4 months for follow-up.  I plan to repeat a CT scan of the abdomen to follow-up on the liver lesion.  We will also repeat another CA 19-9 level.  2.  Normocytic anemia: -Feraheme was on 07/13/2017 and 07/20/2017. -Hemoglobin is normal at 13 today.  Last ferritin was 462 in March.  3.  Diarrhea: -She is taking Creon 36,000 units 3 times a day with meals. -Diarrhea is stable at this time.

## 2018-08-21 NOTE — Patient Instructions (Addendum)
[facility] at [facility] Discharge Instructions  You were seen today by Dr. Katragadda. He went over your recent lab results. He will see you back in 4 months for labs and follow up.   Thank you for choosing [facility] at [facility] to provide your oncology and hematology care.  To afford each patient quality time with our provider, please arrive at least 15 minutes before your scheduled appointment time.   If you have a lab appointment with the Cancer Center please come in thru the  Main Entrance and check in at the main information desk  You need to re-schedule your appointment should you arrive 10 or more minutes late.  We strive to give you quality time with our providers, and arriving late affects you and other patients whose appointments are after yours.  Also, if you no show three or more times for appointments you may be dismissed from the clinic at the providers discretion.     Again, thank you for choosing [facility].  Our hope is that these requests will decrease the amount of time that you wait before being seen by our physicians.       _____________________________________________________________  Should you have questions after your visit to [facility], please contact our office at [phone] between the hours of 8:00 a.m. and 4:30 p.m.  Voicemails left after 4:00 p.m. will not be returned until the following business day.  For prescription refill requests, have your pharmacy contact our office and allow 72 hours.    Cancer Center Support Programs:   > Cancer Support Group  2nd Tuesday of the month 1pm-2pm, Journey Room    

## 2018-09-15 DIAGNOSIS — N189 Chronic kidney disease, unspecified: Secondary | ICD-10-CM | POA: Diagnosis not present

## 2018-09-15 DIAGNOSIS — K219 Gastro-esophageal reflux disease without esophagitis: Secondary | ICD-10-CM | POA: Diagnosis not present

## 2018-09-15 DIAGNOSIS — E871 Hypo-osmolality and hyponatremia: Secondary | ICD-10-CM | POA: Diagnosis not present

## 2018-09-15 DIAGNOSIS — N183 Chronic kidney disease, stage 3 (moderate): Secondary | ICD-10-CM | POA: Diagnosis not present

## 2018-09-15 DIAGNOSIS — E876 Hypokalemia: Secondary | ICD-10-CM | POA: Diagnosis not present

## 2018-09-15 DIAGNOSIS — Z23 Encounter for immunization: Secondary | ICD-10-CM | POA: Diagnosis not present

## 2018-09-20 DIAGNOSIS — Z9189 Other specified personal risk factors, not elsewhere classified: Secondary | ICD-10-CM | POA: Diagnosis not present

## 2018-09-20 DIAGNOSIS — G252 Other specified forms of tremor: Secondary | ICD-10-CM | POA: Diagnosis not present

## 2018-09-20 DIAGNOSIS — K8681 Exocrine pancreatic insufficiency: Secondary | ICD-10-CM | POA: Diagnosis not present

## 2018-09-20 DIAGNOSIS — M1612 Unilateral primary osteoarthritis, left hip: Secondary | ICD-10-CM | POA: Diagnosis not present

## 2018-09-20 DIAGNOSIS — E782 Mixed hyperlipidemia: Secondary | ICD-10-CM | POA: Diagnosis not present

## 2018-09-20 DIAGNOSIS — M1611 Unilateral primary osteoarthritis, right hip: Secondary | ICD-10-CM | POA: Diagnosis not present

## 2018-09-20 DIAGNOSIS — Z79891 Long term (current) use of opiate analgesic: Secondary | ICD-10-CM | POA: Diagnosis not present

## 2018-09-20 DIAGNOSIS — E876 Hypokalemia: Secondary | ICD-10-CM | POA: Diagnosis not present

## 2018-09-20 DIAGNOSIS — I82891 Chronic embolism and thrombosis of other specified veins: Secondary | ICD-10-CM | POA: Diagnosis not present

## 2018-09-20 DIAGNOSIS — F331 Major depressive disorder, recurrent, moderate: Secondary | ICD-10-CM | POA: Diagnosis not present

## 2018-09-20 DIAGNOSIS — I10 Essential (primary) hypertension: Secondary | ICD-10-CM | POA: Diagnosis not present

## 2018-09-20 DIAGNOSIS — K219 Gastro-esophageal reflux disease without esophagitis: Secondary | ICD-10-CM | POA: Diagnosis not present

## 2018-09-20 DIAGNOSIS — Z6823 Body mass index (BMI) 23.0-23.9, adult: Secondary | ICD-10-CM | POA: Diagnosis not present

## 2018-10-19 DIAGNOSIS — R300 Dysuria: Secondary | ICD-10-CM | POA: Diagnosis not present

## 2018-12-08 DIAGNOSIS — E871 Hypo-osmolality and hyponatremia: Secondary | ICD-10-CM | POA: Diagnosis not present

## 2018-12-08 DIAGNOSIS — I10 Essential (primary) hypertension: Secondary | ICD-10-CM | POA: Diagnosis not present

## 2018-12-08 DIAGNOSIS — K219 Gastro-esophageal reflux disease without esophagitis: Secondary | ICD-10-CM | POA: Diagnosis not present

## 2018-12-08 DIAGNOSIS — R748 Abnormal levels of other serum enzymes: Secondary | ICD-10-CM | POA: Diagnosis not present

## 2018-12-08 DIAGNOSIS — E876 Hypokalemia: Secondary | ICD-10-CM | POA: Diagnosis not present

## 2018-12-08 DIAGNOSIS — I82891 Chronic embolism and thrombosis of other specified veins: Secondary | ICD-10-CM | POA: Diagnosis not present

## 2018-12-08 DIAGNOSIS — E782 Mixed hyperlipidemia: Secondary | ICD-10-CM | POA: Diagnosis not present

## 2018-12-08 DIAGNOSIS — N189 Chronic kidney disease, unspecified: Secondary | ICD-10-CM | POA: Diagnosis not present

## 2018-12-08 DIAGNOSIS — J449 Chronic obstructive pulmonary disease, unspecified: Secondary | ICD-10-CM | POA: Diagnosis not present

## 2018-12-11 DIAGNOSIS — J449 Chronic obstructive pulmonary disease, unspecified: Secondary | ICD-10-CM | POA: Diagnosis not present

## 2018-12-11 DIAGNOSIS — E876 Hypokalemia: Secondary | ICD-10-CM | POA: Diagnosis not present

## 2018-12-11 DIAGNOSIS — I10 Essential (primary) hypertension: Secondary | ICD-10-CM | POA: Diagnosis not present

## 2018-12-11 DIAGNOSIS — E782 Mixed hyperlipidemia: Secondary | ICD-10-CM | POA: Diagnosis not present

## 2018-12-11 DIAGNOSIS — Z23 Encounter for immunization: Secondary | ICD-10-CM | POA: Diagnosis not present

## 2018-12-11 DIAGNOSIS — I82891 Chronic embolism and thrombosis of other specified veins: Secondary | ICD-10-CM | POA: Diagnosis not present

## 2018-12-11 DIAGNOSIS — R748 Abnormal levels of other serum enzymes: Secondary | ICD-10-CM | POA: Diagnosis not present

## 2018-12-11 DIAGNOSIS — N189 Chronic kidney disease, unspecified: Secondary | ICD-10-CM | POA: Diagnosis not present

## 2018-12-12 DIAGNOSIS — I82891 Chronic embolism and thrombosis of other specified veins: Secondary | ICD-10-CM | POA: Diagnosis not present

## 2018-12-12 DIAGNOSIS — E782 Mixed hyperlipidemia: Secondary | ICD-10-CM | POA: Diagnosis not present

## 2018-12-12 DIAGNOSIS — G252 Other specified forms of tremor: Secondary | ICD-10-CM | POA: Diagnosis not present

## 2018-12-12 DIAGNOSIS — M1611 Unilateral primary osteoarthritis, right hip: Secondary | ICD-10-CM | POA: Diagnosis not present

## 2018-12-12 DIAGNOSIS — F331 Major depressive disorder, recurrent, moderate: Secondary | ICD-10-CM | POA: Diagnosis not present

## 2018-12-12 DIAGNOSIS — I10 Essential (primary) hypertension: Secondary | ICD-10-CM | POA: Diagnosis not present

## 2018-12-12 DIAGNOSIS — K8681 Exocrine pancreatic insufficiency: Secondary | ICD-10-CM | POA: Diagnosis not present

## 2018-12-12 DIAGNOSIS — Z6823 Body mass index (BMI) 23.0-23.9, adult: Secondary | ICD-10-CM | POA: Diagnosis not present

## 2018-12-19 ENCOUNTER — Other Ambulatory Visit (HOSPITAL_COMMUNITY): Payer: Self-pay | Admitting: Hematology

## 2018-12-19 DIAGNOSIS — C259 Malignant neoplasm of pancreas, unspecified: Secondary | ICD-10-CM

## 2018-12-21 ENCOUNTER — Other Ambulatory Visit: Payer: Self-pay

## 2018-12-21 ENCOUNTER — Ambulatory Visit (HOSPITAL_COMMUNITY)
Admission: RE | Admit: 2018-12-21 | Discharge: 2018-12-21 | Disposition: A | Discharge status: Home / Self Care | Payer: Medicare Other | Source: Ambulatory Visit | Attending: Hematology | Admitting: Hematology

## 2018-12-21 ENCOUNTER — Inpatient Hospital Stay (HOSPITAL_COMMUNITY): Payer: Medicare Other | Attending: Hematology

## 2018-12-21 DIAGNOSIS — C259 Malignant neoplasm of pancreas, unspecified: Secondary | ICD-10-CM | POA: Diagnosis not present

## 2018-12-21 DIAGNOSIS — Z87891 Personal history of nicotine dependence: Secondary | ICD-10-CM | POA: Insufficient documentation

## 2018-12-21 DIAGNOSIS — K769 Liver disease, unspecified: Secondary | ICD-10-CM | POA: Diagnosis not present

## 2018-12-21 DIAGNOSIS — D649 Anemia, unspecified: Secondary | ICD-10-CM | POA: Diagnosis not present

## 2018-12-21 DIAGNOSIS — R42 Dizziness and giddiness: Secondary | ICD-10-CM | POA: Insufficient documentation

## 2018-12-21 DIAGNOSIS — K529 Noninfective gastroenteritis and colitis, unspecified: Secondary | ICD-10-CM | POA: Diagnosis not present

## 2018-12-21 LAB — COMPREHENSIVE METABOLIC PANEL
ALT: 31 U/L (ref 0–44)
AST: 41 U/L (ref 15–41)
Albumin: 4 g/dL (ref 3.5–5.0)
Alkaline Phosphatase: 77 U/L (ref 38–126)
Anion gap: 12 (ref 5–15)
BUN: 16 mg/dL (ref 8–23)
CO2: 28 mmol/L (ref 22–32)
Calcium: 9.3 mg/dL (ref 8.9–10.3)
Chloride: 101 mmol/L (ref 98–111)
Creatinine, Ser: 0.87 mg/dL (ref 0.44–1.00)
GFR calc Af Amer: >60 mL/min (ref >60)
GFR calc non Af Amer: >60 mL/min (ref >60)
Glucose, Bld: 92 mg/dL (ref 70–99)
Potassium: 4.6 mmol/L (ref 3.5–5.1)
Sodium: 141 mmol/L (ref 135–145)
Total Bilirubin: 0.8 mg/dL (ref 0.3–1.2)
Total Protein: 7 g/dL (ref 6.5–8.1)

## 2018-12-21 LAB — CBC WITH DIFFERENTIAL/PLATELET
Abs Immature Granulocytes: 0.05 10*3/uL (ref 0.00–0.07)
Basophils Absolute: 0 10*3/uL (ref 0.0–0.1)
Basophils Relative: 0 %
Eosinophils Absolute: 0.3 10*3/uL (ref 0.0–0.5)
Eosinophils Relative: 4 %
HCT: 40 % (ref 36.0–46.0)
Hemoglobin: 12.3 g/dL (ref 12.0–15.0)
Immature Granulocytes: 1 %
Lymphocytes Relative: 43 %
Lymphs Abs: 4 10*3/uL (ref 0.7–4.0)
MCH: 28.3 pg (ref 26.0–34.0)
MCHC: 30.8 g/dL (ref 30.0–36.0)
MCV: 92 fL (ref 80.0–100.0)
Monocytes Absolute: 0.8 10*3/uL (ref 0.1–1.0)
Monocytes Relative: 8 %
Neutro Abs: 4.1 10*3/uL (ref 1.7–7.7)
Neutrophils Relative %: 44 %
Platelets: 316 10*3/uL (ref 150–400)
RBC: 4.35 MIL/uL (ref 3.87–5.11)
RDW: 17.3 % — ABNORMAL HIGH (ref 11.5–15.5)
WBC: 9.3 10*3/uL (ref 4.0–10.5)
nRBC: 0 % (ref 0.0–0.2)

## 2018-12-21 MED ORDER — IOHEXOL 300 MG/ML  SOLN
75.0000 mL | Freq: Once | INTRAMUSCULAR | Status: AC | PRN
  Administered 2018-12-21: 75 mL via INTRAVENOUS

## 2018-12-22 LAB — CANCER ANTIGEN 19-9: CA 19-9: 15 U/mL (ref 0–35)

## 2018-12-26 DIAGNOSIS — R001 Bradycardia, unspecified: Secondary | ICD-10-CM | POA: Diagnosis not present

## 2018-12-26 DIAGNOSIS — F329 Major depressive disorder, single episode, unspecified: Secondary | ICD-10-CM | POA: Diagnosis not present

## 2018-12-26 DIAGNOSIS — F515 Nightmare disorder: Secondary | ICD-10-CM | POA: Diagnosis not present

## 2018-12-26 DIAGNOSIS — R5381 Other malaise: Secondary | ICD-10-CM | POA: Diagnosis not present

## 2018-12-26 DIAGNOSIS — Z8673 Personal history of transient ischemic attack (TIA), and cerebral infarction without residual deficits: Secondary | ICD-10-CM | POA: Diagnosis not present

## 2018-12-26 DIAGNOSIS — F518 Other sleep disorders not due to a substance or known physiological condition: Secondary | ICD-10-CM | POA: Diagnosis not present

## 2018-12-26 DIAGNOSIS — Z8507 Personal history of malignant neoplasm of pancreas: Secondary | ICD-10-CM | POA: Diagnosis not present

## 2018-12-26 DIAGNOSIS — M810 Age-related osteoporosis without current pathological fracture: Secondary | ICD-10-CM | POA: Diagnosis not present

## 2018-12-26 DIAGNOSIS — R404 Transient alteration of awareness: Secondary | ICD-10-CM | POA: Diagnosis not present

## 2018-12-26 DIAGNOSIS — Z888 Allergy status to other drugs, medicaments and biological substances status: Secondary | ICD-10-CM | POA: Diagnosis not present

## 2018-12-26 DIAGNOSIS — Z87891 Personal history of nicotine dependence: Secondary | ICD-10-CM | POA: Diagnosis not present

## 2018-12-26 DIAGNOSIS — M1990 Unspecified osteoarthritis, unspecified site: Secondary | ICD-10-CM | POA: Diagnosis not present

## 2018-12-26 DIAGNOSIS — Z79899 Other long term (current) drug therapy: Secondary | ICD-10-CM | POA: Diagnosis not present

## 2018-12-26 DIAGNOSIS — K219 Gastro-esophageal reflux disease without esophagitis: Secondary | ICD-10-CM | POA: Diagnosis not present

## 2018-12-26 DIAGNOSIS — R4182 Altered mental status, unspecified: Secondary | ICD-10-CM | POA: Diagnosis not present

## 2018-12-26 DIAGNOSIS — I10 Essential (primary) hypertension: Secondary | ICD-10-CM | POA: Diagnosis not present

## 2018-12-28 ENCOUNTER — Other Ambulatory Visit: Payer: Self-pay

## 2018-12-28 ENCOUNTER — Encounter (HOSPITAL_COMMUNITY): Payer: Self-pay | Admitting: Hematology

## 2018-12-28 ENCOUNTER — Inpatient Hospital Stay (HOSPITAL_BASED_OUTPATIENT_CLINIC_OR_DEPARTMENT_OTHER): Payer: Medicare Other | Admitting: Hematology

## 2018-12-28 DIAGNOSIS — K529 Noninfective gastroenteritis and colitis, unspecified: Secondary | ICD-10-CM | POA: Diagnosis not present

## 2018-12-28 DIAGNOSIS — C259 Malignant neoplasm of pancreas, unspecified: Secondary | ICD-10-CM | POA: Diagnosis not present

## 2018-12-28 DIAGNOSIS — Z20822 Contact with and (suspected) exposure to covid-19: Secondary | ICD-10-CM

## 2018-12-28 DIAGNOSIS — Z20828 Contact with and (suspected) exposure to other viral communicable diseases: Secondary | ICD-10-CM | POA: Diagnosis not present

## 2018-12-28 DIAGNOSIS — Z87891 Personal history of nicotine dependence: Secondary | ICD-10-CM | POA: Diagnosis not present

## 2018-12-28 DIAGNOSIS — R42 Dizziness and giddiness: Secondary | ICD-10-CM | POA: Diagnosis not present

## 2018-12-28 DIAGNOSIS — D649 Anemia, unspecified: Secondary | ICD-10-CM | POA: Diagnosis not present

## 2018-12-28 NOTE — Assessment & Plan Note (Signed)
1.  Stage IIb (T3N1) pancreatic adenocarcinoma: -Status post Whipple surgery on 06/21/2013 at [place].  She did not receive any adjuvant chemotherapy due to complicated postoperative course. - Last PET scan from 04/10/2018 showed no abnormal high signal intensity collection along the lateral margin of the left kidney coinciding with her hematoma.  Small focus of attenuated activity in the lateral segment of the left hepatic lobe near the falciform ligament mildly above background hepatic activity.  Persistent mild asymmetric activity in the palatine tonsillar region left more than right.  She was seen by Dr. Benjamine Mola for the tonsillar lesion. -PET scan on 08/14/2018 did not show any evidence of metastatic disease in the neck, chest, abdomen or pelvis.  Small focus of low-level increased metabolic activity in the left lobe of the liver, stable to decreased compared to prior exam.  Near complete resolution of previously noted left-sided perinephric fluid collection. -We reviewed results of the CT of the abdomen with and without contrast dated 12/21/2018 which showed partial pancreatectomy without evidence of local recurrence or metastatic disease.  No enhancing hepatic lesion along the falciform ligament.  No evidence of liver metastasis. -LFTs and CA 19-9 on 12/21/2018 were within normal limits. -We will see her back in 6 months with repeat scan and labs.  2.  Normocytic anemia: -Feraheme was on 07/13/2017 and 07/20/2017. -Hemoglobin is 12.3 today.  3.  Diarrhea: -She is taking Creon 36,000 units 3 times a day with meals. -Diarrhea is stable at this time.

## 2018-12-28 NOTE — Progress Notes (Signed)
[place] [place], [place] [address]   CLINIC:  Medical Oncology/Hematology  PCP:  Caryl Bis, MD [place] [address] [phone]   REASON FOR VISIT:  Follow-up for pancreatic cancer and tonsillar nodule  CURRENT THERAPY: Observation   BRIEF ONCOLOGIC HISTORY:  Oncology History  Adenocarcinoma of pancreas ([place])  05/23/2013 Tumor Marker   Patient's tumor was tested for the following markers: CA 19-9. Results of the tumor marker test revealed 86.   05/24/2013 Tumor Marker   Patient's tumor was tested for the following markers: CEA. Results of the tumor marker test revealed 5.   06/18/2013 Procedure   Malignant distal CBD stricture in distal 2-3cm of CBD- 10 x 35mm covered metal stent placed      06/21/2013 Procedure   Diagnostic laparoscopy, open pylorus preserving pancreaticoduodenectomy, omentectomy by Dr. Maggie Font ([place])     06/26/2013 Cancer Staging   [id number]   06/26/2013 Pathology Results   Adenocarcinoma of distal common bile duct in the head of pancreas, 1.2 cm, moderately differentiated, 1 mitosis/10 HPF, negative margines, perineural invasion identified, peripancreatic soft tissue invasion seen, tumor involves pancreatic acinar tissue and submucosa of duodenum near ampulla, 2/7 positive nodes   01/06/2016 Imaging   CT abd/pelvis- 1. Patient status post Whipple procedure without evidence of pancreatic carcinoma recurrence or metastasis. 2. No surgical complication identified. 3. Bilateral pars defects with grade 1 anterolisthesis at L5-S1.   06/21/2016 Imaging   Ct abd/pelvis- Stable postop changes from Whipple procedure. No evidence of recurrent or metastatic carcinoma within abdomen.  Incidentally noted aortic atherosclerosis and bilateral L5 pars defects, with degenerative disc disease and grade 2 anterolisthesis at L5-S1      CANCER STAGING: Cancer Staging Adenocarcinoma of pancreas [facility])  Staging form: Pancreas, AJCC 7th Edition - Pathologic stage from 06/26/2013: Stage IIB (T3, N1, cM0) - Signed by Baird Cancer, PA-C on 12/23/2015    INTERVAL HISTORY:  Ms. Hingtgen 68 y.o. female seen for follow-up of pancreatic cancer.  Denies any abdominal pains.  She is taking Creon 3 times a day with meals.  Appetite and energy levels are 100%.  She does report occasional dizziness.  Chronic diarrhea has been stable.  Denies any nausea or vomiting.  Denies fevers, night sweats or weight loss in the last 6 months.      REVIEW OF SYSTEMS:  Review of Systems  Gastrointestinal: Positive for diarrhea.  All other systems reviewed and are negative.    PAST MEDICAL/SURGICAL HISTORY:  Past Medical History:  Diagnosis Date  . Adenocarcinoma of pancreas ([place]) 12/23/2015  . Cancer [facility])    pancreatic  . Chronic abdominal pain   . Chronic diarrhea   . Depression   . Falls frequently   . Fracture of right humerus   . Hip fracture, right ([place])   . Hypotension    Past Surgical History:  Procedure Laterality Date  . GASTROSTOMY TUBE PLACEMENT       SOCIAL HISTORY:  Social History   Socioeconomic History  . Marital status: Legally Separated    Spouse name: Not on file  . Number of children: Not on file  . Years of education: Not on file  . Highest education level: Not on file  Occupational History  . Not on file  Social Needs  . Financial resource strain: Not on file  . Food insecurity    Worry: Not on file    Inability: Not on file  .  Transportation needs    Medical: Not on file    Non-medical: Not on file  Tobacco Use  . Smoking status: Former Smoker    Packs/day: 2.00    Years: 35.00    Pack years: 70.00    Quit date: 12/23/2010    Years since quitting: 8.0  . Smokeless tobacco: Never Used  Substance and Sexual Activity  . 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 Drug use: No  . Sexual activity: Not on file   Lifestyle  . Physical activity    Days per week: Not on file    Minutes per session: Not on file  . Stress: Not on file  Relationships  . Social Herbalist on phone: Not on file    Gets together: Not on file    Attends religious service: Not on file    Active member of club or organization: Not on file    Attends meetings of clubs or organizations: Not on file    Relationship status: Not on file  . Intimate partner violence    Fear of current or ex partner: Not on file    Emotionally abused: Not on file    Physically abused: Not on file    Forced sexual activity: Not on file  Other Topics Concern  . Not on file  Social History Narrative  . Not on file    FAMILY HISTORY:  Family History  Adopted: Yes    CURRENT MEDICATIONS:  Outpatient Encounter Medications as of 12/28/2018  Medication Sig Note  . atorvastatin (LIPITOR) 20 MG tablet Take 20 mg by mouth at bedtime.   . Cranberry-Vitamin C-Probiotic (AZO CRANBERRY) 250-30 MG TABS Take 1 tablet by mouth daily.   Marland Kitchen CREON 36000 units CPEP capsule TAKE ONE CAPSULE BY MOUTH THREE TIMES DAILY WITH MEALS.   . diazepam (VALIUM) 5 MG tablet Take 5 mg by mouth 2 (two) times daily.  12/23/2015: Received from: [place]: Take 1 tablet by mouth 3 (three) times a day as needed.  . DULoxetine (CYMBALTA) 60 MG capsule Take 60 mg by mouth daily.   Marland Kitchen ibandronate (BONIVA) 150 MG tablet Take by mouth every 30 (thirty) days.  12/23/2015: Received from: [place]: Take 150 mg by mouth every 30 (thirty) days. Take one 150 mg tablet monthly on the same day each month. Swallow whole tablet with 6-8 oz of plain water only. Don't eat, drink (except for water), or take oth  . metoprolol succinate (TOPROL-XL) 25 MG 24 hr tablet Take 12.5 mg by mouth daily.    Marland Kitchen omeprazole (PRILOSEC) 40 MG capsule Take 40 mg by mouth daily. 12/23/2015: Received from: External Pharmacy Received Sig: TAKE ONE CAPSULE BY MOUTH DAILY   . potassium chloride (K-DUR,KLOR-CON) 10 MEQ tablet Take 10 mEq by mouth daily.  12/23/2015: Received from: [place]: Take 1 tablet by mouth daily.  . traMADol (ULTRAM) 50 MG tablet 50 mg every 6 (six) hours as needed.    . ondansetron (ZOFRAN) 4 MG tablet Take 4 mg by mouth every 8 (eight) hours as needed. 12/23/2015: Received from: External Pharmacy Received Sig: TAKE ONE TABLET BY MOUTH EVERY 8 HOURS AS NEEDED  . [DISCONTINUED] SYMBICORT 160-4.5 MCG/ACT inhaler Pt is not taking-does not need it    No facility-administered encounter medications on file as of 12/28/2018.     ALLERGIES:  No Known Allergies  PHYSICAL EXAM:  ECOG Performance status: 1  Vitals:   12/28/18 1507  BP: 139/70  Pulse: 68  Resp: 18  Temp: (!) 97.3 F (36.3 C)  SpO2: 100%   Filed Weights   12/28/18 1507  Weight: 121 lb (54.9 kg)    Physical Exam Constitutional:      Appearance: Normal appearance.  Cardiovascular:     Rate and Rhythm: Normal rate and regular rhythm.  Pulmonary:     Effort: Pulmonary effort is normal.     Breath sounds: Normal breath sounds.  Abdominal:     General: Bowel sounds are normal. There is no distension.     Palpations: Abdomen is soft. There is no mass.  Musculoskeletal:        General: No swelling.  Skin:    General: Skin is warm.  Neurological:     General: No focal deficit present.     Mental Status: She is alert and oriented to person, place, and time.  Psychiatric:        Mood and Affect: Mood normal.        Behavior: Behavior normal.      LABORATORY DATA:  I have reviewed the labs as listed.  CBC    Component Value Date/Time   WBC 9.3 12/21/2018 1009   RBC 4.35 12/21/2018 1009   HGB 12.3 12/21/2018 1009   HCT 40.0 12/21/2018 1009   PLT 316 12/21/2018 1009   MCV 92.0 12/21/2018 1009   MCH 28.3 12/21/2018 1009   MCHC 30.8 12/21/2018 1009   RDW 17.3 (H) 12/21/2018 1009   LYMPHSABS 4.0 12/21/2018 1009   MONOABS 0.8 12/21/2018  1009   EOSABS 0.3 12/21/2018 1009   BASOSABS 0.0 12/21/2018 1009   CMP Latest Ref Rng & Units 12/21/2018 08/14/2018 04/10/2018  Glucose 70 - 99 mg/dL 92 95 96  BUN 8 - 23 mg/dL 16 14 9   Creatinine 0.44 - 1.00 mg/dL 0.87 0.93 0.88  Sodium 135 - 145 mmol/L 141 140 137  Potassium 3.5 - 5.1 mmol/L 4.6 4.3 3.8  Chloride 98 - 111 mmol/L 101 110 102  CO2 22 - 32 mmol/L 28 23 24   Calcium 8.9 - 10.3 mg/dL 9.3 8.0(L) 9.1  Total Protein 6.5 - 8.1 g/dL 7.0 7.6 8.3(H)  Total Bilirubin 0.3 - 1.2 mg/dL 0.8 0.8 0.9  Alkaline Phos 38 - 126 U/L 77 102 105  AST 15 - 41 U/L 41 24 35  ALT 0 - 44 U/L 31 19 22        DIAGNOSTIC IMAGING:  I have independently reviewed the scans and discussed with the patient.    ASSESSMENT & PLAN:   Adenocarcinoma of pancreas ([place]) 1.  Stage IIb (T3N1) pancreatic adenocarcinoma: -Status post Whipple surgery on 06/21/2013 at [place].  She did not receive any adjuvant chemotherapy due to complicated postoperative course. - Last PET scan from 04/10/2018 showed no abnormal high signal intensity collection along the lateral margin of the left kidney coinciding with her hematoma.  Small focus of attenuated activity in the lateral segment of the left hepatic lobe near the falciform ligament mildly above background hepatic activity.  Persistent mild asymmetric activity in the palatine tonsillar region left more than right.  She was seen by Dr. Benjamine Mola for the tonsillar lesion. -PET scan on 08/14/2018 did not show any evidence of metastatic disease in the neck, chest, abdomen or pelvis.  Small focus of low-level increased metabolic activity in the left lobe of the liver, stable to decreased compared  to prior exam.  Near complete resolution of previously noted left-sided perinephric fluid collection. -We reviewed results of the CT of the abdomen with and without contrast dated 12/21/2018 which showed partial pancreatectomy without evidence of local recurrence or metastatic disease.  No  enhancing hepatic lesion along the falciform ligament.  No evidence of liver metastasis. -LFTs and CA 19-9 on 12/21/2018 were within normal limits. -We will see her back in 6 months with repeat scan and labs.  2.  Normocytic anemia: -Feraheme was on 07/13/2017 and 07/20/2017. -Hemoglobin is 12.3 today.  3.  Diarrhea: -She is taking Creon 36,000 units 3 times a day with meals. -Diarrhea is stable at this time.     Orders placed this encounter:  Orders Placed This Encounter  Procedures  . MR Abdomen W Wo Contrast  . CBC with Differential/Platelet  . Comprehensive metabolic panel  . Cancer antigen 19-9      Derek Jack, MD [place] [phone]

## 2018-12-28 NOTE — Patient Instructions (Addendum)
[place] at [facility] Discharge Instructions  You were seen today by Dr. Delton Coombes. He went over your recent lab results. He will see you back in 6 months for labs, MRI and follow up.   Thank you for choosing [place] at [facility] to provide your oncology and hematology care.  To afford each patient quality time with our provider, please arrive at least 15 minutes before your scheduled appointment time.   If you have a lab appointment with the [facility] please come in thru the  Main Entrance and check in at the main information desk  You need to re-schedule your appointment should you arrive 10 or more minutes late.  We strive to give you quality time with our providers, and arriving late affects you and other patients whose appointments are after yours.  Also, if you no show three or more times for appointments you may be dismissed from the clinic at the providers discretion.     Again, thank you for choosing [facility].  Our hope is that these requests will decrease the amount of time that you wait before being seen by our physicians.       _____________________________________________________________  Should you have questions after your visit to [facility], please contact our office at [phone] between the hours of 8:00 a.m. and 4:30 p.m.  Voicemails left after 4:00 p.m. will not be returned until the following business day.  For prescription refill requests, have your pharmacy contact our office and allow 72 hours.    Cancer Center Support Programs:   > Cancer Support Group  2nd Tuesday of the month 1pm-2pm, Journey Room

## 2018-12-30 LAB — NOVEL CORONAVIRUS, NAA: SARS-CoV-2, NAA: NOT DETECTED

## 2019-01-03 ENCOUNTER — Telehealth: Payer: Self-pay

## 2019-01-03 NOTE — Telephone Encounter (Signed)
Patient notified of negative result and verbalized understanding

## 2019-03-09 DIAGNOSIS — E7849 Other hyperlipidemia: Secondary | ICD-10-CM | POA: Diagnosis not present

## 2019-03-09 DIAGNOSIS — I10 Essential (primary) hypertension: Secondary | ICD-10-CM | POA: Diagnosis not present

## 2019-03-21 DIAGNOSIS — R197 Diarrhea, unspecified: Secondary | ICD-10-CM | POA: Diagnosis not present

## 2019-03-21 DIAGNOSIS — T391X1A Poisoning by 4-Aminophenol derivatives, accidental (unintentional), initial encounter: Secondary | ICD-10-CM | POA: Diagnosis not present

## 2019-03-21 DIAGNOSIS — N390 Urinary tract infection, site not specified: Secondary | ICD-10-CM | POA: Diagnosis not present

## 2019-03-21 DIAGNOSIS — S3991XA Unspecified injury of abdomen, initial encounter: Secondary | ICD-10-CM | POA: Diagnosis not present

## 2019-03-21 DIAGNOSIS — B179 Acute viral hepatitis, unspecified: Secondary | ICD-10-CM | POA: Diagnosis not present

## 2019-03-21 DIAGNOSIS — E872 Acidosis: Secondary | ICD-10-CM | POA: Diagnosis not present

## 2019-03-21 DIAGNOSIS — N179 Acute kidney failure, unspecified: Secondary | ICD-10-CM | POA: Diagnosis not present

## 2019-03-21 DIAGNOSIS — F3340 Major depressive disorder, recurrent, in remission, unspecified: Secondary | ICD-10-CM | POA: Diagnosis not present

## 2019-03-21 DIAGNOSIS — E860 Dehydration: Secondary | ICD-10-CM | POA: Diagnosis not present

## 2019-03-21 DIAGNOSIS — Z20822 Contact with and (suspected) exposure to covid-19: Secondary | ICD-10-CM | POA: Diagnosis not present

## 2019-03-21 DIAGNOSIS — R112 Nausea with vomiting, unspecified: Secondary | ICD-10-CM | POA: Diagnosis not present

## 2019-03-22 DIAGNOSIS — E785 Hyperlipidemia, unspecified: Secondary | ICD-10-CM | POA: Diagnosis present

## 2019-03-22 DIAGNOSIS — N390 Urinary tract infection, site not specified: Secondary | ICD-10-CM | POA: Diagnosis present

## 2019-03-22 DIAGNOSIS — Z87891 Personal history of nicotine dependence: Secondary | ICD-10-CM | POA: Diagnosis not present

## 2019-03-22 DIAGNOSIS — K219 Gastro-esophageal reflux disease without esophagitis: Secondary | ICD-10-CM | POA: Diagnosis present

## 2019-03-22 DIAGNOSIS — N179 Acute kidney failure, unspecified: Secondary | ICD-10-CM | POA: Diagnosis present

## 2019-03-22 DIAGNOSIS — K8689 Other specified diseases of pancreas: Secondary | ICD-10-CM | POA: Diagnosis present

## 2019-03-22 DIAGNOSIS — M1990 Unspecified osteoarthritis, unspecified site: Secondary | ICD-10-CM | POA: Diagnosis present

## 2019-03-22 DIAGNOSIS — E872 Acidosis: Secondary | ICD-10-CM | POA: Diagnosis present

## 2019-03-22 DIAGNOSIS — R197 Diarrhea, unspecified: Secondary | ICD-10-CM | POA: Diagnosis not present

## 2019-03-22 DIAGNOSIS — E860 Dehydration: Secondary | ICD-10-CM | POA: Diagnosis present

## 2019-03-22 DIAGNOSIS — Z8507 Personal history of malignant neoplasm of pancreas: Secondary | ICD-10-CM | POA: Diagnosis not present

## 2019-03-22 DIAGNOSIS — S3991XA Unspecified injury of abdomen, initial encounter: Secondary | ICD-10-CM | POA: Diagnosis not present

## 2019-03-22 DIAGNOSIS — Z20822 Contact with and (suspected) exposure to covid-19: Secondary | ICD-10-CM | POA: Diagnosis present

## 2019-03-22 DIAGNOSIS — B179 Acute viral hepatitis, unspecified: Secondary | ICD-10-CM | POA: Diagnosis present

## 2019-03-22 DIAGNOSIS — T391X1A Poisoning by 4-Aminophenol derivatives, accidental (unintentional), initial encounter: Secondary | ICD-10-CM | POA: Diagnosis present

## 2019-03-22 DIAGNOSIS — I10 Essential (primary) hypertension: Secondary | ICD-10-CM | POA: Diagnosis present

## 2019-03-22 DIAGNOSIS — F3340 Major depressive disorder, recurrent, in remission, unspecified: Secondary | ICD-10-CM | POA: Diagnosis present

## 2019-03-22 DIAGNOSIS — M810 Age-related osteoporosis without current pathological fracture: Secondary | ICD-10-CM | POA: Diagnosis present

## 2019-03-22 DIAGNOSIS — E876 Hypokalemia: Secondary | ICD-10-CM | POA: Diagnosis not present

## 2019-03-22 DIAGNOSIS — K712 Toxic liver disease with acute hepatitis: Secondary | ICD-10-CM | POA: Diagnosis present

## 2019-03-22 DIAGNOSIS — R112 Nausea with vomiting, unspecified: Secondary | ICD-10-CM | POA: Diagnosis not present

## 2019-03-22 DIAGNOSIS — B9620 Unspecified Escherichia coli [E. coli] as the cause of diseases classified elsewhere: Secondary | ICD-10-CM | POA: Diagnosis present

## 2019-03-22 DIAGNOSIS — J449 Chronic obstructive pulmonary disease, unspecified: Secondary | ICD-10-CM | POA: Diagnosis present

## 2019-03-22 DIAGNOSIS — G609 Hereditary and idiopathic neuropathy, unspecified: Secondary | ICD-10-CM | POA: Diagnosis present

## 2019-03-22 DIAGNOSIS — Z8673 Personal history of transient ischemic attack (TIA), and cerebral infarction without residual deficits: Secondary | ICD-10-CM | POA: Diagnosis not present

## 2019-03-22 DIAGNOSIS — Z7951 Long term (current) use of inhaled steroids: Secondary | ICD-10-CM | POA: Diagnosis not present

## 2019-03-28 DIAGNOSIS — N390 Urinary tract infection, site not specified: Secondary | ICD-10-CM | POA: Diagnosis not present

## 2019-03-28 DIAGNOSIS — R4182 Altered mental status, unspecified: Secondary | ICD-10-CM | POA: Diagnosis not present

## 2019-03-28 DIAGNOSIS — R531 Weakness: Secondary | ICD-10-CM | POA: Diagnosis not present

## 2019-03-28 DIAGNOSIS — R442 Other hallucinations: Secondary | ICD-10-CM | POA: Diagnosis not present

## 2019-03-28 DIAGNOSIS — Z881 Allergy status to other antibiotic agents status: Secondary | ICD-10-CM | POA: Diagnosis not present

## 2019-03-28 DIAGNOSIS — Z888 Allergy status to other drugs, medicaments and biological substances status: Secondary | ICD-10-CM | POA: Diagnosis not present

## 2019-03-28 DIAGNOSIS — Z8507 Personal history of malignant neoplasm of pancreas: Secondary | ICD-10-CM | POA: Diagnosis not present

## 2019-03-28 DIAGNOSIS — W19XXXA Unspecified fall, initial encounter: Secondary | ICD-10-CM | POA: Diagnosis not present

## 2019-03-28 DIAGNOSIS — Z79899 Other long term (current) drug therapy: Secondary | ICD-10-CM | POA: Diagnosis not present

## 2019-03-28 DIAGNOSIS — R443 Hallucinations, unspecified: Secondary | ICD-10-CM | POA: Diagnosis not present

## 2019-03-28 DIAGNOSIS — K219 Gastro-esophageal reflux disease without esophagitis: Secondary | ICD-10-CM | POA: Diagnosis not present

## 2019-03-28 DIAGNOSIS — Z87891 Personal history of nicotine dependence: Secondary | ICD-10-CM | POA: Diagnosis not present

## 2019-03-28 DIAGNOSIS — Z8673 Personal history of transient ischemic attack (TIA), and cerebral infarction without residual deficits: Secondary | ICD-10-CM | POA: Diagnosis not present

## 2019-03-28 DIAGNOSIS — I10 Essential (primary) hypertension: Secondary | ICD-10-CM | POA: Diagnosis not present

## 2019-04-03 DIAGNOSIS — Z8507 Personal history of malignant neoplasm of pancreas: Secondary | ICD-10-CM | POA: Diagnosis not present

## 2019-04-03 DIAGNOSIS — F339 Major depressive disorder, recurrent, unspecified: Secondary | ICD-10-CM | POA: Diagnosis not present

## 2019-04-03 DIAGNOSIS — J449 Chronic obstructive pulmonary disease, unspecified: Secondary | ICD-10-CM | POA: Diagnosis not present

## 2019-04-03 DIAGNOSIS — G609 Hereditary and idiopathic neuropathy, unspecified: Secondary | ICD-10-CM | POA: Diagnosis not present

## 2019-04-03 DIAGNOSIS — M6281 Muscle weakness (generalized): Secondary | ICD-10-CM | POA: Diagnosis not present

## 2019-04-03 DIAGNOSIS — I10 Essential (primary) hypertension: Secondary | ICD-10-CM | POA: Diagnosis not present

## 2019-04-03 DIAGNOSIS — B189 Chronic viral hepatitis, unspecified: Secondary | ICD-10-CM | POA: Diagnosis not present

## 2019-04-03 DIAGNOSIS — K8689 Other specified diseases of pancreas: Secondary | ICD-10-CM | POA: Diagnosis not present

## 2019-04-03 DIAGNOSIS — M1990 Unspecified osteoarthritis, unspecified site: Secondary | ICD-10-CM | POA: Diagnosis not present

## 2019-04-03 DIAGNOSIS — M810 Age-related osteoporosis without current pathological fracture: Secondary | ICD-10-CM | POA: Diagnosis not present

## 2019-04-03 DIAGNOSIS — N390 Urinary tract infection, site not specified: Secondary | ICD-10-CM | POA: Diagnosis not present

## 2019-04-04 DIAGNOSIS — B179 Acute viral hepatitis, unspecified: Secondary | ICD-10-CM | POA: Diagnosis not present

## 2019-04-04 DIAGNOSIS — R410 Disorientation, unspecified: Secondary | ICD-10-CM | POA: Diagnosis not present

## 2019-04-04 DIAGNOSIS — N3090 Cystitis, unspecified without hematuria: Secondary | ICD-10-CM | POA: Diagnosis not present

## 2019-04-05 DIAGNOSIS — B189 Chronic viral hepatitis, unspecified: Secondary | ICD-10-CM | POA: Diagnosis not present

## 2019-04-05 DIAGNOSIS — I10 Essential (primary) hypertension: Secondary | ICD-10-CM | POA: Diagnosis not present

## 2019-04-05 DIAGNOSIS — K8689 Other specified diseases of pancreas: Secondary | ICD-10-CM | POA: Diagnosis not present

## 2019-04-05 DIAGNOSIS — G609 Hereditary and idiopathic neuropathy, unspecified: Secondary | ICD-10-CM | POA: Diagnosis not present

## 2019-04-05 DIAGNOSIS — N390 Urinary tract infection, site not specified: Secondary | ICD-10-CM | POA: Diagnosis not present

## 2019-04-05 DIAGNOSIS — J449 Chronic obstructive pulmonary disease, unspecified: Secondary | ICD-10-CM | POA: Diagnosis not present

## 2019-04-06 DIAGNOSIS — K8689 Other specified diseases of pancreas: Secondary | ICD-10-CM | POA: Diagnosis not present

## 2019-04-06 DIAGNOSIS — N390 Urinary tract infection, site not specified: Secondary | ICD-10-CM | POA: Diagnosis not present

## 2019-04-06 DIAGNOSIS — G609 Hereditary and idiopathic neuropathy, unspecified: Secondary | ICD-10-CM | POA: Diagnosis not present

## 2019-04-06 DIAGNOSIS — I10 Essential (primary) hypertension: Secondary | ICD-10-CM | POA: Diagnosis not present

## 2019-04-06 DIAGNOSIS — J449 Chronic obstructive pulmonary disease, unspecified: Secondary | ICD-10-CM | POA: Diagnosis not present

## 2019-04-06 DIAGNOSIS — B189 Chronic viral hepatitis, unspecified: Secondary | ICD-10-CM | POA: Diagnosis not present

## 2019-04-10 DIAGNOSIS — N390 Urinary tract infection, site not specified: Secondary | ICD-10-CM | POA: Diagnosis not present

## 2019-04-10 DIAGNOSIS — J449 Chronic obstructive pulmonary disease, unspecified: Secondary | ICD-10-CM | POA: Diagnosis not present

## 2019-04-10 DIAGNOSIS — G609 Hereditary and idiopathic neuropathy, unspecified: Secondary | ICD-10-CM | POA: Diagnosis not present

## 2019-04-10 DIAGNOSIS — K8689 Other specified diseases of pancreas: Secondary | ICD-10-CM | POA: Diagnosis not present

## 2019-04-10 DIAGNOSIS — B189 Chronic viral hepatitis, unspecified: Secondary | ICD-10-CM | POA: Diagnosis not present

## 2019-04-10 DIAGNOSIS — I10 Essential (primary) hypertension: Secondary | ICD-10-CM | POA: Diagnosis not present

## 2019-04-14 ENCOUNTER — Ambulatory Visit: Payer: Medicare Other | Attending: Internal Medicine

## 2019-04-14 DIAGNOSIS — Z23 Encounter for immunization: Secondary | ICD-10-CM | POA: Insufficient documentation

## 2019-04-14 NOTE — Progress Notes (Signed)
   Covid-19 Vaccination Clinic  Name:  Maureen Vaughn    MRN: [id number] DOB: 1950-11-23  04/14/2019  [name] was observed post Covid-19 immunization for 15 minutes without incident. She was provided with Vaccine Information Sheet and instruction to access the V-Safe system.   [name] was instructed to call 911 with any severe reactions post vaccine: Marland Kitchen Difficulty breathing  . Swelling of face and throat  . A fast heartbeat  . A bad rash all over body  . Dizziness and weakness   Immunizations Administered    Name Date Dose VIS Date Route   Moderna COVID-19 Vaccine 04/14/2019  9:02 AM 0.5 mL 01/09/2019 Intramuscular   Manufacturer: Moderna   Lot: [id number]   [place][id number]

## 2019-04-18 DIAGNOSIS — B189 Chronic viral hepatitis, unspecified: Secondary | ICD-10-CM | POA: Diagnosis not present

## 2019-04-18 DIAGNOSIS — G609 Hereditary and idiopathic neuropathy, unspecified: Secondary | ICD-10-CM | POA: Diagnosis not present

## 2019-04-18 DIAGNOSIS — N390 Urinary tract infection, site not specified: Secondary | ICD-10-CM | POA: Diagnosis not present

## 2019-04-18 DIAGNOSIS — J449 Chronic obstructive pulmonary disease, unspecified: Secondary | ICD-10-CM | POA: Diagnosis not present

## 2019-04-18 DIAGNOSIS — I10 Essential (primary) hypertension: Secondary | ICD-10-CM | POA: Diagnosis not present

## 2019-04-18 DIAGNOSIS — K8689 Other specified diseases of pancreas: Secondary | ICD-10-CM | POA: Diagnosis not present

## 2019-04-24 DIAGNOSIS — N3090 Cystitis, unspecified without hematuria: Secondary | ICD-10-CM | POA: Diagnosis not present

## 2019-04-24 DIAGNOSIS — B179 Acute viral hepatitis, unspecified: Secondary | ICD-10-CM | POA: Diagnosis not present

## 2019-05-16 ENCOUNTER — Ambulatory Visit: Payer: Medicare Other | Attending: Internal Medicine

## 2019-05-16 DIAGNOSIS — Z23 Encounter for immunization: Secondary | ICD-10-CM

## 2019-05-16 NOTE — Progress Notes (Signed)
   Covid-19 Vaccination Clinic  Name:  Analese Lamay    MRN: [id number] DOB: 1950/05/19  05/16/2019  Ms. Westergaard was observed post Covid-19 immunization for 15 minutes without incident. She was provided with Vaccine Information Sheet and instruction to access the V-Safe system.   Ms. Doublin was instructed to call 911 with any severe reactions post vaccine: Marland Kitchen Difficulty breathing  . Swelling of face and throat  . A fast heartbeat  . A bad rash all over body  . Dizziness and weakness   Immunizations Administered    Name Date Dose VIS Date Route   Moderna COVID-19 Vaccine 05/16/2019  3:17 PM 0.5 mL 01/09/2019 Intramuscular   Manufacturer: Levan Hurst   LotUD:[id number]   [place]:[id number]

## 2019-05-24 DIAGNOSIS — K219 Gastro-esophageal reflux disease without esophagitis: Secondary | ICD-10-CM | POA: Diagnosis not present

## 2019-05-24 DIAGNOSIS — I10 Essential (primary) hypertension: Secondary | ICD-10-CM | POA: Diagnosis not present

## 2019-05-24 DIAGNOSIS — E782 Mixed hyperlipidemia: Secondary | ICD-10-CM | POA: Diagnosis not present

## 2019-05-24 DIAGNOSIS — N189 Chronic kidney disease, unspecified: Secondary | ICD-10-CM | POA: Diagnosis not present

## 2019-05-24 DIAGNOSIS — E876 Hypokalemia: Secondary | ICD-10-CM | POA: Diagnosis not present

## 2019-05-24 DIAGNOSIS — N183 Chronic kidney disease, stage 3 unspecified: Secondary | ICD-10-CM | POA: Diagnosis not present

## 2019-06-27 ENCOUNTER — Inpatient Hospital Stay (HOSPITAL_COMMUNITY): Payer: Medicare Other | Attending: Hematology

## 2019-06-27 ENCOUNTER — Ambulatory Visit (HOSPITAL_COMMUNITY): Admission: RE | Admit: 2019-06-27 | Payer: Medicare Other | Source: Ambulatory Visit

## 2019-07-04 ENCOUNTER — Ambulatory Visit (HOSPITAL_COMMUNITY): Payer: Medicare Other | Admitting: Hematology

## 2019-07-04 ENCOUNTER — Inpatient Hospital Stay (HOSPITAL_COMMUNITY): Payer: Medicare Other | Attending: Hematology

## 2019-07-04 ENCOUNTER — Other Ambulatory Visit: Payer: Self-pay

## 2019-07-04 DIAGNOSIS — C259 Malignant neoplasm of pancreas, unspecified: Secondary | ICD-10-CM | POA: Diagnosis not present

## 2019-07-04 LAB — COMPREHENSIVE METABOLIC PANEL
ALT: 22 U/L (ref 0–44)
AST: 25 U/L (ref 15–41)
Albumin: 3.9 g/dL (ref 3.5–5.0)
Alkaline Phosphatase: 105 U/L (ref 38–126)
Anion gap: 9 (ref 5–15)
BUN: 13 mg/dL (ref 8–23)
CO2: 26 mmol/L (ref 22–32)
Calcium: 9 mg/dL (ref 8.9–10.3)
Chloride: 106 mmol/L (ref 98–111)
Creatinine, Ser: 0.97 mg/dL (ref 0.44–1.00)
GFR calc Af Amer: >60 mL/min (ref >60)
GFR calc non Af Amer: >60 mL/min (ref >60)
Glucose, Bld: 95 mg/dL (ref 70–99)
Potassium: 4.4 mmol/L (ref 3.5–5.1)
Sodium: 141 mmol/L (ref 135–145)
Total Bilirubin: 1 mg/dL (ref 0.3–1.2)
Total Protein: 7.2 g/dL (ref 6.5–8.1)

## 2019-07-04 LAB — CBC WITH DIFFERENTIAL/PLATELET
Abs Immature Granulocytes: 0.02 10*3/uL (ref 0.00–0.07)
Basophils Absolute: 0.1 10*3/uL (ref 0.0–0.1)
Basophils Relative: 1 %
Eosinophils Absolute: 0.3 10*3/uL (ref 0.0–0.5)
Eosinophils Relative: 3 %
HCT: 42.9 % (ref 36.0–46.0)
Hemoglobin: 13.2 g/dL (ref 12.0–15.0)
Immature Granulocytes: 0 %
Lymphocytes Relative: 35 %
Lymphs Abs: 3.2 10*3/uL (ref 0.7–4.0)
MCH: 27.5 pg (ref 26.0–34.0)
MCHC: 30.8 g/dL (ref 30.0–36.0)
MCV: 89.4 fL (ref 80.0–100.0)
Monocytes Absolute: 0.6 10*3/uL (ref 0.1–1.0)
Monocytes Relative: 7 %
Neutro Abs: 4.8 10*3/uL (ref 1.7–7.7)
Neutrophils Relative %: 54 %
Platelets: 317 10*3/uL (ref 150–400)
RBC: 4.8 MIL/uL (ref 3.87–5.11)
RDW: 14.9 % (ref 11.5–15.5)
WBC: 8.9 10*3/uL (ref 4.0–10.5)
nRBC: 0 % (ref 0.0–0.2)

## 2019-07-05 LAB — CANCER ANTIGEN 19-9: CA 19-9: 14 U/mL (ref 0–35)

## 2019-07-12 ENCOUNTER — Other Ambulatory Visit (HOSPITAL_COMMUNITY): Payer: Self-pay | Admitting: Nurse Practitioner

## 2019-07-12 DIAGNOSIS — C259 Malignant neoplasm of pancreas, unspecified: Secondary | ICD-10-CM

## 2019-07-23 IMAGING — CT NM PET TUM IMG INITIAL (PI) SKULL BASE T - THIGH
8 series · 23 of 25 positions shown · non-contrast
Comparison: CT AP 06/22/17.

CLINICAL DATA: Initial treatment strategy for pancreas
adenocarcinoma. Status post partial pancreatectomy.

EXAM:
NUCLEAR MEDICINE PET SKULL BASE TO THIGH
TECHNIQUE: 6.16 mCi F-18 FDG was injected intravenously. Full-ring PET imaging
was performed from the skull base to thigh after the radiotracer. CT
data was obtained and used for attenuation correction and anatomic
localization.
Fasting blood glucose: 87 mg/dl

[Series 3: pet sk_thigh ac · axial · 5.0mm · 4.07mm/px · z∈[-920,-80]mm · 5 of 211 slices shown]
[im 1/211]
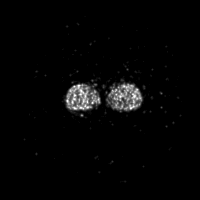
[im 53/211]
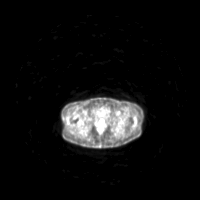
[im 106/211]
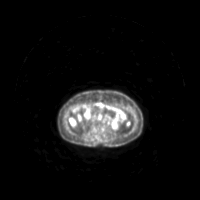
[im 158/211]
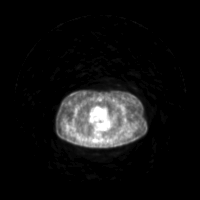
[im 211/211]
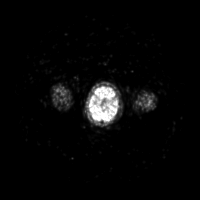

[Series 4: ct sk_thigh 5.0 b31f · axial · 5.0mm · 0.98mm/px · z∈[-920,-80]mm · 4 of 211 slices shown]
[im 1/211]
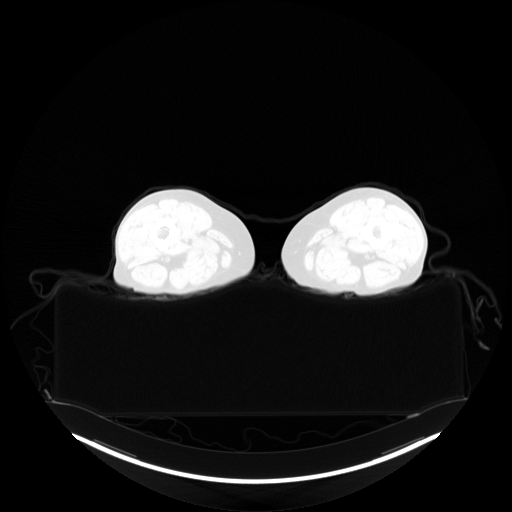
[im 106/211]
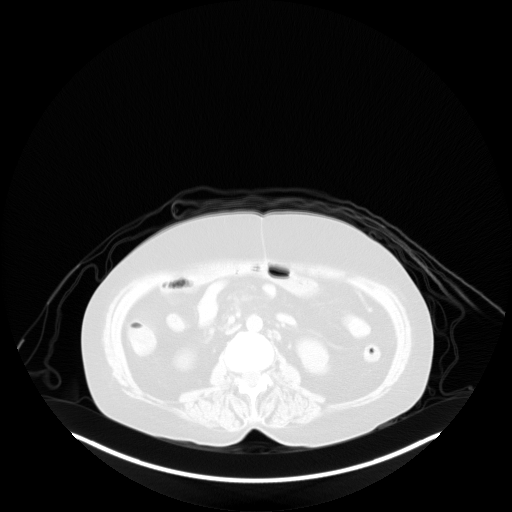
[im 158/211]
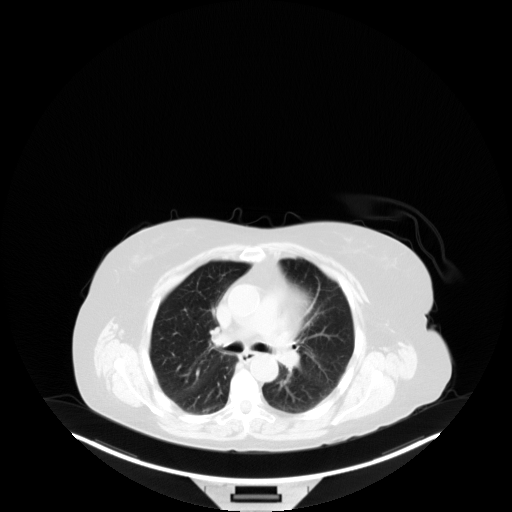
[im 211/211  brain]
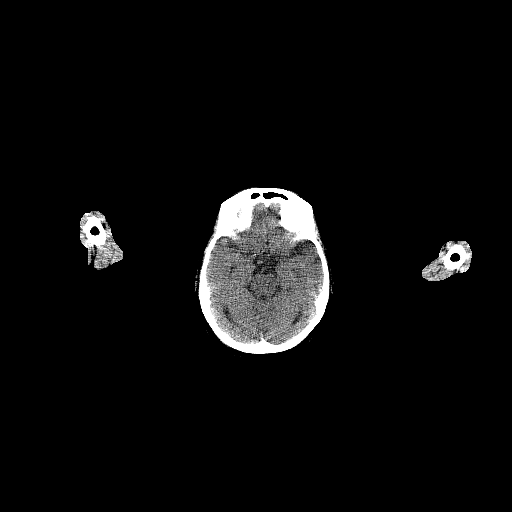

[Series 5: pet sk_thigh nac · axial · 5.0mm · 4.07mm/px · z∈[-920,-80]mm · 5 of 211 slices shown]
[im 1/211]
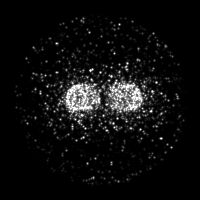
[im 53/211]
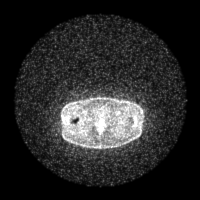
[im 106/211]
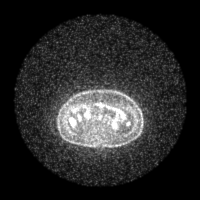
[im 158/211]
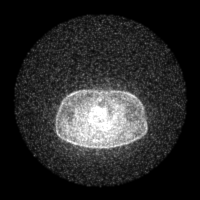
[im 211/211]
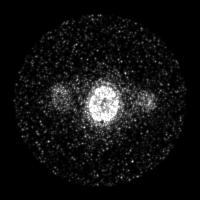

[Series 8: ct sk_thigh 5.0 b70f (id)_bone · axial · 5.0mm · 0.98mm/px · 1 of 56 slices shown]
[im 1/56  soft-tissue]
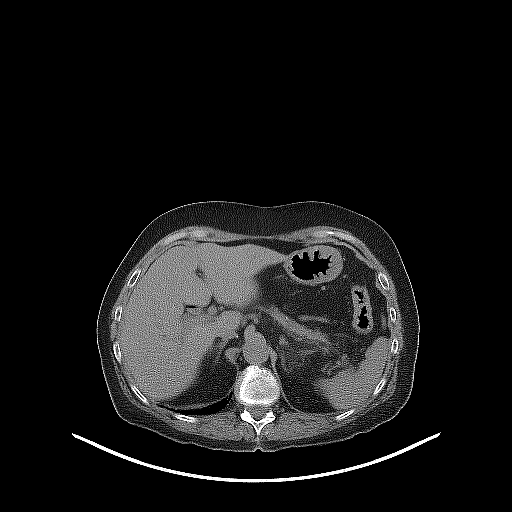

[Series 603: mip range 3 · coronal · 1.74mm/px · 1 of 32 slices shown]
[im 1/32]
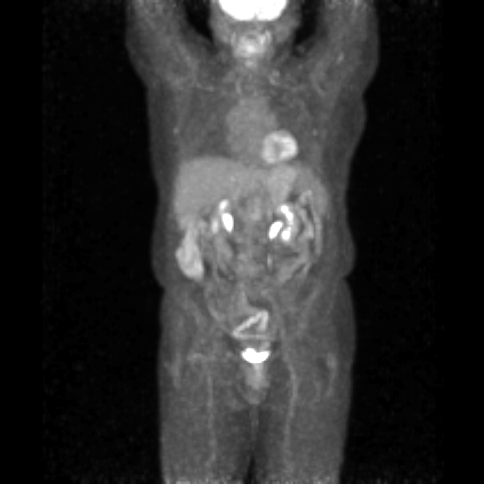

[Series 604: range-ct sk_thigh 5.0 (id)<alpha range> · 1 of 77 slices shown (1 of 2)]
[im 1/77]
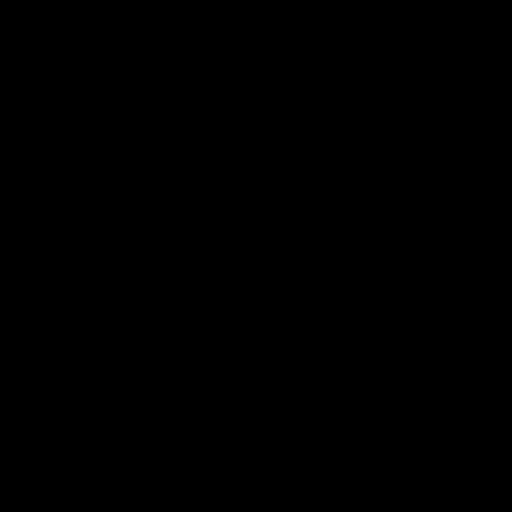

[Series 605: range-ct sk_thigh 5.0 (id)<alpha range> · 5 of 199 slices shown (2 of 2)]
[im 1/199]
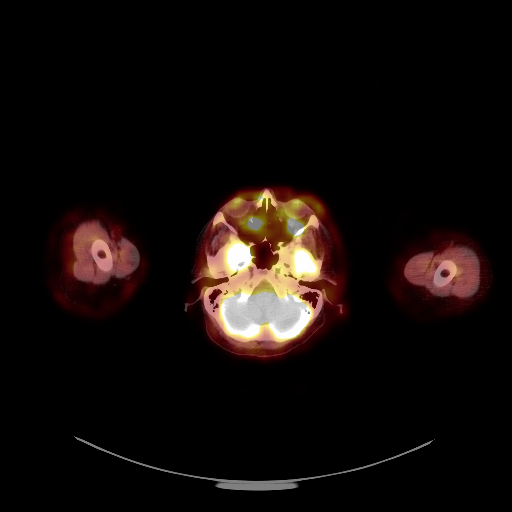
[im 50/199]
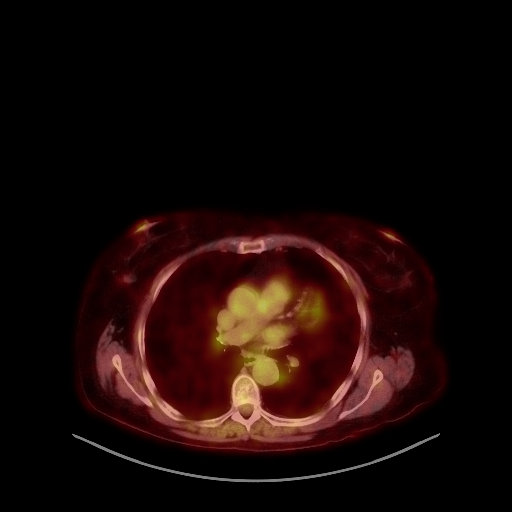
[im 100/199]
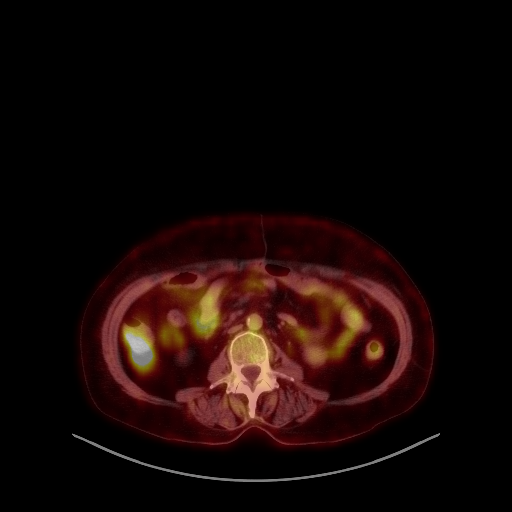
[im 149/199]
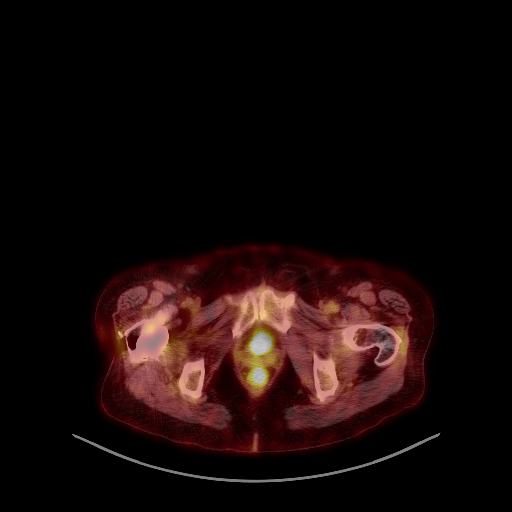
[im 199/199]
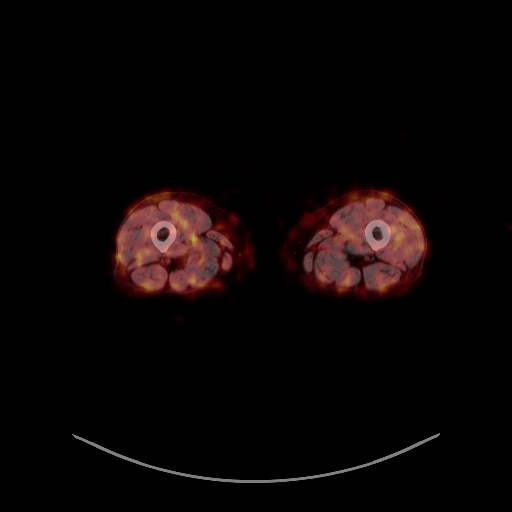

[Series 1070: results mm oncology reading · 1.0mm · 0.50mm/px · 1 of 2 slices shown]
[im 1/2]
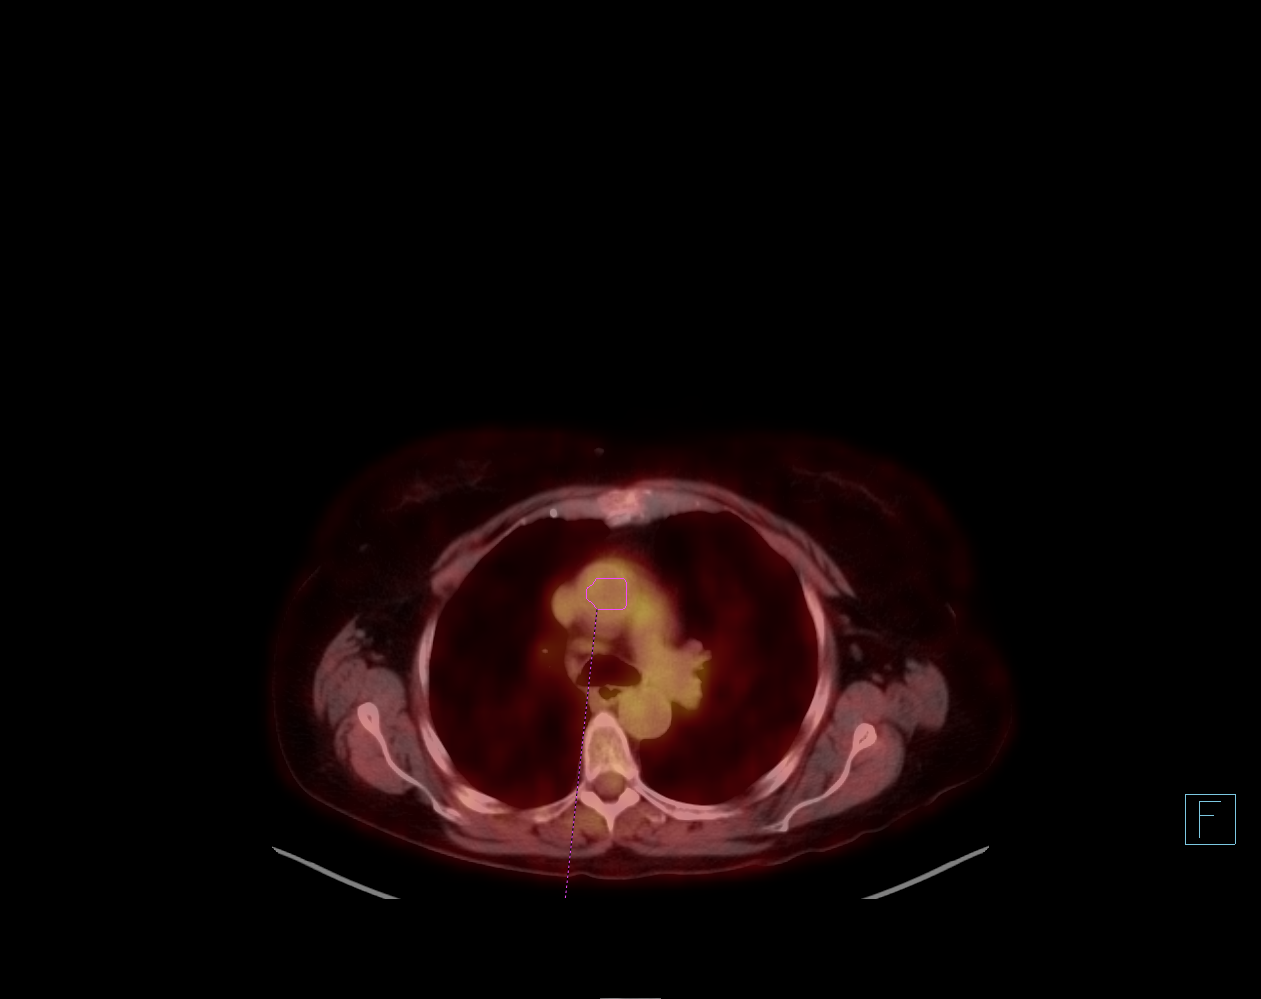

[23 of 25 positions shown; findings below may reference images not displayed]

FINDINGS: Mediastinal blood pool activity: SUV max

NECK: Registration artifact is identified within the head neck
region. There is asymmetric increased activity within the left
tonsillar region with an SUV max equal to 6.97. No hypermetabolic
lymph nodes within the neck.

Incidental CT findings: none

CHEST: No hypermetabolic mediastinal or hilar nodes. No suspicious
pulmonary nodules on the CT scan. Tiny nodule, which may represent a
small granuloma measuring 2 mm is identified in the posterolateral
right upper lobe. Too small to characterize by PET-CT

Incidental CT findings: Aortic atherosclerosis. Calcification in the
LAD and left circumflex coronary arteries noted.

ABDOMEN/PELVIS: No abnormal hypermetabolic activity within the
liver, pancreas, adrenal glands, or spleen. No hypermetabolic lymph
nodes in the abdomen or pelvis.

Incidental CT findings: Postoperative changes from Whipple
procedure. Pneumobilia identified within the liver compatible with
biliary patency. Aortic atherosclerosis and branch vessel disease
identified.

SKELETON: No focal hypermetabolic activity to suggest skeletal
metastasis.

Incidental CT findings: none
IMPRESSION: 1. No findings identified to suggest local tumor recurrence status
post Whipple procedure and no specific findings to suggest
metastatic disease.
2. There is asymmetric increased radiotracer uptake within the left
tonsillar region. Nonspecific. Consider correlation with direct
visualization to exclude head neck primary.
3. Aortic atherosclerosis and coronary artery atherosclerotic
calcifications. Aortic Atherosclerosis ([id number]).

## 2019-07-25 ENCOUNTER — Other Ambulatory Visit (HOSPITAL_COMMUNITY): Payer: Medicare Other

## 2019-07-31 ENCOUNTER — Ambulatory Visit (HOSPITAL_COMMUNITY): Payer: Medicare Other | Admitting: Oncology

## 2019-08-08 DIAGNOSIS — Z9189 Other specified personal risk factors, not elsewhere classified: Secondary | ICD-10-CM | POA: Diagnosis not present

## 2019-08-08 DIAGNOSIS — E876 Hypokalemia: Secondary | ICD-10-CM | POA: Diagnosis not present

## 2019-08-08 DIAGNOSIS — Z6826 Body mass index (BMI) 26.0-26.9, adult: Secondary | ICD-10-CM | POA: Diagnosis not present

## 2019-08-08 DIAGNOSIS — I82891 Chronic embolism and thrombosis of other specified veins: Secondary | ICD-10-CM | POA: Diagnosis not present

## 2019-08-08 DIAGNOSIS — E782 Mixed hyperlipidemia: Secondary | ICD-10-CM | POA: Diagnosis not present

## 2019-08-08 DIAGNOSIS — M1612 Unilateral primary osteoarthritis, left hip: Secondary | ICD-10-CM | POA: Diagnosis not present

## 2019-08-08 DIAGNOSIS — I10 Essential (primary) hypertension: Secondary | ICD-10-CM | POA: Diagnosis not present

## 2019-08-08 DIAGNOSIS — Z79891 Long term (current) use of opiate analgesic: Secondary | ICD-10-CM | POA: Diagnosis not present

## 2019-08-08 DIAGNOSIS — K8681 Exocrine pancreatic insufficiency: Secondary | ICD-10-CM | POA: Diagnosis not present

## 2019-08-08 DIAGNOSIS — F331 Major depressive disorder, recurrent, moderate: Secondary | ICD-10-CM | POA: Diagnosis not present

## 2019-08-08 DIAGNOSIS — M1611 Unilateral primary osteoarthritis, right hip: Secondary | ICD-10-CM | POA: Diagnosis not present

## 2019-08-08 DIAGNOSIS — K219 Gastro-esophageal reflux disease without esophagitis: Secondary | ICD-10-CM | POA: Diagnosis not present

## 2019-08-10 ENCOUNTER — Other Ambulatory Visit: Payer: Self-pay

## 2019-08-10 ENCOUNTER — Ambulatory Visit (HOSPITAL_COMMUNITY)
Admission: RE | Admit: 2019-08-10 | Discharge: 2019-08-10 | Disposition: A | Discharge status: Home / Self Care | Payer: Medicare Other | Source: Ambulatory Visit | Attending: Hematology | Admitting: Hematology

## 2019-08-10 ENCOUNTER — Other Ambulatory Visit (HOSPITAL_COMMUNITY): Payer: Self-pay | Admitting: Hematology

## 2019-08-10 DIAGNOSIS — K8689 Other specified diseases of pancreas: Secondary | ICD-10-CM | POA: Diagnosis not present

## 2019-08-10 DIAGNOSIS — C259 Malignant neoplasm of pancreas, unspecified: Secondary | ICD-10-CM | POA: Insufficient documentation

## 2019-08-10 DIAGNOSIS — K6389 Other specified diseases of intestine: Secondary | ICD-10-CM | POA: Diagnosis not present

## 2019-08-10 DIAGNOSIS — N2889 Other specified disorders of kidney and ureter: Secondary | ICD-10-CM | POA: Diagnosis not present

## 2019-08-10 MED ORDER — GADOBUTROL 1 MMOL/ML IV SOLN
9.0000 mL | Freq: Once | INTRAVENOUS | Status: AC | PRN
  Administered 2019-08-10: 6 mL via INTRAVENOUS

## 2019-08-14 ENCOUNTER — Ambulatory Visit (HOSPITAL_COMMUNITY): Payer: Medicare Other | Admitting: Nurse Practitioner

## 2019-08-14 ENCOUNTER — Ambulatory Visit (HOSPITAL_COMMUNITY): Payer: Medicare Other | Admitting: Hematology

## 2019-08-23 ENCOUNTER — Encounter (HOSPITAL_COMMUNITY): Payer: Self-pay | Admitting: Nurse Practitioner

## 2019-08-23 ENCOUNTER — Inpatient Hospital Stay (HOSPITAL_COMMUNITY): Payer: Medicare Other | Attending: Hematology | Admitting: Nurse Practitioner

## 2019-08-23 ENCOUNTER — Other Ambulatory Visit: Payer: Self-pay

## 2019-08-23 DIAGNOSIS — D649 Anemia, unspecified: Secondary | ICD-10-CM | POA: Insufficient documentation

## 2019-08-23 DIAGNOSIS — Z87891 Personal history of nicotine dependence: Secondary | ICD-10-CM | POA: Diagnosis not present

## 2019-08-23 DIAGNOSIS — C259 Malignant neoplasm of pancreas, unspecified: Secondary | ICD-10-CM | POA: Diagnosis not present

## 2019-08-23 DIAGNOSIS — R197 Diarrhea, unspecified: Secondary | ICD-10-CM | POA: Insufficient documentation

## 2019-08-23 DIAGNOSIS — R5383 Other fatigue: Secondary | ICD-10-CM | POA: Insufficient documentation

## 2019-08-23 NOTE — Progress Notes (Signed)
Maureen Vaughn, [place] [address]   CLINIC:  Medical Oncology/Hematology  PCP:  Caryl Bis, MD [place] [address] [phone]   REASON FOR VISIT: Follow-up for adenocarcinoma the pancreas   CURRENT THERAPY: Surveillance  BRIEF ONCOLOGIC HISTORY:  Oncology History  Adenocarcinoma of pancreas ([place])  05/23/2013 Tumor Marker   Patient's tumor was tested for the following markers: CA 19-9. Results of the tumor marker test revealed 86.   05/24/2013 Tumor Marker   Patient's tumor was tested for the following markers: CEA. Results of the tumor marker test revealed 5.   06/18/2013 Procedure   Malignant distal CBD stricture in distal 2-3cm of CBD- 10 x 60mm covered metal stent placed      06/21/2013 Procedure   Diagnostic laparoscopy, open pylorus preserving pancreaticoduodenectomy, omentectomy by Dr. 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 Patient status post Whipple procedure without evidence of pancreatic carcinoma recurrence or metastasis. 2. No surgical complication identified. 3. Bilateral pars defects with grade 1 anterolisthesis at L5-S1.   06/21/2016 Imaging   Ct abd/pelvis- Stable postop changes from Whipple procedure. No evidence of recurrent or metastatic carcinoma within abdomen.  Incidentally noted aortic atherosclerosis and bilateral L5 pars defects, with degenerative disc disease and grade 2 anterolisthesis at L5-S1     CANCER STAGING: Cancer Staging Adenocarcinoma of pancreas [facility]) Staging form:  Pancreas, AJCC 7th Edition - Pathologic stage from 06/26/2013: Stage IIB (T3, N1, cM0) - Signed by Baird Cancer, PA-C on 12/23/2015    INTERVAL HISTORY:  Maureen Vaughn 69 y.o. female returns for routine follow-up for adenocarcinoma the pancreas.  She reports she is fatigued throughout the day.  She needs help with her ADLs.  She does bruise very easily.  And she still has diarrhea that is more stable than it was. Denies any nausea and vomiting. Denies any new pains. Had not noticed any recent bleeding such as epistaxis, hematuria or hematochezia. Denies recent chest pain on exertion, shortness of breath on minimal exertion, pre-syncopal episodes, or palpitations. Denies any numbness or tingling in hands or feet. Denies any recent fevers, infections, or recent hospitalizations. Patient reports appetite at 100% and energy level at 25%.     REVIEW OF SYSTEMS:  Review of Systems  Constitutional: Positive for fatigue.  Gastrointestinal: Positive for diarrhea.  Neurological: Positive for dizziness, headaches and numbness.  Hematological: Bruises/bleeds easily.  All other systems reviewed and are negative.    PAST MEDICAL/SURGICAL HISTORY:  Past Medical History:  Diagnosis Date  . Adenocarcinoma of pancreas ([place]) 12/23/2015  . Cancer [facility])    pancreatic  . Chronic abdominal pain   . Chronic diarrhea   . Depression   . Falls frequently   . Fracture of right humerus   . Hip fracture, right ([place])   . Hypotension    Past Surgical History:  Procedure Laterality Date  . GASTROSTOMY TUBE PLACEMENT       SOCIAL HISTORY:  Social History   Socioeconomic History  . Marital status: Legally Separated    Spouse name: Not on file  . Number of  children: Not on file  . Years of education: Not on file  . Highest education level: Not on file  Occupational History  . Not on file  Tobacco Use  . Smoking status: Former Smoker    Packs/day: 2.00    Years: 35.00    Pack years: 70.00     Quit date: 12/23/2010    Years since quitting: 8.6  . Smokeless tobacco: Never Used  Substance and Sexual Activity  . 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 Drug use: No  . Sexual activity: Not on file  Other Topics Concern  . Not on file  Social History Narrative  . Not on file   Social Determinants of Health   Financial Resource Strain:   . Difficulty of Paying Living Expenses:   Food Insecurity:   . Worried About Charity fundraiser in the Last Year:   . Arboriculturist in the Last Year:   Transportation Needs:   . Film/video editor (Medical):   Marland Kitchen Lack of Transportation (Non-Medical):   Physical Activity:   . Days of Exercise per Week:   . Minutes of Exercise per Session:   Stress:   . Feeling of Stress :   Social Connections:   . Frequency of Communication with Friends and Family:   . Frequency of Social Gatherings with Friends and Family:   . Attends Religious Services:   . Active Member of Clubs or Organizations:   . Attends Archivist Meetings:   Marland Kitchen Marital Status:   Intimate Partner Violence:   . Fear of Current or Ex-Partner:   . Emotionally Abused:   Marland Kitchen Physically Abused:   . Sexually Abused:     FAMILY HISTORY:  Family History  Adopted: Yes    CURRENT MEDICATIONS:  Outpatient Encounter Medications as of 08/23/2019  Medication Sig Note  . atorvastatin (LIPITOR) 20 MG tablet Take 20 mg by mouth at bedtime.   . Cranberry-Vitamin C-Probiotic (AZO CRANBERRY) 250-30 MG TABS Take 1 tablet by mouth daily.   Marland Kitchen CREON 36000-114000 units CPEP capsule TAKE ONE CAPSULE BY MOUTH THREE TIMES DAILY WITH MEALS.   . diazepam (VALIUM) 5 MG tablet Take 5 mg by mouth 2 (two) times daily.  12/23/2015: Received from: [facility]: Take 1 tablet by mouth 3 (three) times a day as needed.  . DULoxetine (CYMBALTA) 60 MG capsule Take 60 mg by mouth daily.   Marland Kitchen ibandronate (BONIVA) 150 MG tablet Take by mouth  every 30 (thirty) days.  12/23/2015: Received from: [place]: Take 150 mg by mouth every 30 (thirty) days. Take one 150 mg tablet monthly on the same day each month. Swallow whole tablet with 6-8 oz of plain water only. Don't eat, drink (except for water), or take oth  . metoprolol succinate (TOPROL-XL) 25 MG 24 hr tablet Take 12.5 mg by mouth daily.    Marland Kitchen omeprazole (PRILOSEC) 40 MG capsule Take 40 mg by mouth daily. 12/23/2015: Received from: External Pharmacy Received Sig: TAKE ONE CAPSULE BY MOUTH DAILY  . ondansetron (ZOFRAN) 4 MG tablet Take 4 mg by mouth every 8 (eight) hours as needed. 12/23/2015: Received from: External Pharmacy Received Sig: TAKE ONE TABLET BY MOUTH EVERY 8 HOURS AS NEEDED  . potassium chloride (K-DUR,KLOR-CON) 10 MEQ tablet Take 10 mEq by mouth daily.  12/23/2015: Received from: [place]: Take 1 tablet by mouth daily.  Marland Kitchen  traMADol (ULTRAM) 50 MG tablet 50 mg every 6 (six) hours as needed.     No facility-administered encounter medications on file as of 08/23/2019.    ALLERGIES:  No Known Allergies   PHYSICAL EXAM:  ECOG Performance status: 1  Vitals:   08/23/19 1012  BP: 112/85  Pulse: 65  Resp: 18  Temp: (!) 97.1 F (36.2 C)  SpO2: 99%   Filed Weights   08/23/19 1012  Weight: 131 lb 3.2 oz (59.5 kg)   Physical Exam Constitutional:      Appearance: Normal appearance. She is normal weight.  Cardiovascular:     Rate and Rhythm: Normal rate and regular rhythm.     Heart sounds: Normal heart sounds.  Pulmonary:     Effort: Pulmonary effort is normal.     Breath sounds: Normal breath sounds.  Abdominal:     General: Bowel sounds are normal.     Palpations: Abdomen is soft.  Musculoskeletal:        General: Normal range of motion.  Skin:    General: Skin is warm.  Neurological:     Mental Status: She is alert and oriented to person, place, and time. Mental status is at baseline.  Psychiatric:        Mood and  Affect: Mood normal.        Thought Content: Thought content normal.        Judgment: Judgment normal.      LABORATORY DATA:  I have reviewed the labs as listed.  CBC    Component Value Date/Time   WBC 8.9 07/04/2019 1455   RBC 4.80 07/04/2019 1455   HGB 13.2 07/04/2019 1455   HCT 42.9 07/04/2019 1455   PLT 317 07/04/2019 1455   MCV 89.4 07/04/2019 1455   MCH 27.5 07/04/2019 1455   MCHC 30.8 07/04/2019 1455   RDW 14.9 07/04/2019 1455   LYMPHSABS 3.2 07/04/2019 1455   MONOABS 0.6 07/04/2019 1455   EOSABS 0.3 07/04/2019 1455   BASOSABS 0.1 07/04/2019 1455   CMP Latest Ref Rng & Units 07/04/2019 12/21/2018 08/14/2018  Glucose 70 - 99 mg/dL 95 92 95  BUN 8 - 23 mg/dL 13 16 14   Creatinine 0.44 - 1.00 mg/dL 0.97 0.87 0.93  Sodium 135 - 145 mmol/L 141 141 140  Potassium 3.5 - 5.1 mmol/L 4.4 4.6 4.3  Chloride 98 - 111 mmol/L 106 101 110  CO2 22 - 32 mmol/L 26 28 23   Calcium 8.9 - 10.3 mg/dL 9.0 9.3 8.0(L)  Total Protein 6.5 - 8.1 g/dL 7.2 7.0 7.6  Total Bilirubin 0.3 - 1.2 mg/dL 1.0 0.8 0.8  Alkaline Phos 38 - 126 U/L 105 77 102  AST 15 - 41 U/L 25 41 24  ALT 0 - 44 U/L 22 31 19     DIAGNOSTIC IMAGING:  I have independently reviewed the  MRI scans and discussed with the patient.  ASSESSMENT & PLAN:  Adenocarcinoma of pancreas ([place]) 1.  Stage IIb pancreatic adenocarcinoma: -Status post Whipple surgery on 06/21/2013 at [place].  She did not receive any adjuvant chemotherapy due to complicated postoperative course. -PET scan on 04/10/2018 showed no abnormal high signal intensity collection along the lateral margin of the left kidney coinciding with her hematoma.  Small focus of attenuated activity in the lateral segment of the left hepatic lobe near the falciform ligament mildly above the background hepatic activity.  Persistent mild asymmetric activity in the palatine tonsillar region left more than right.  She was seen  by Dr. Benjamine Mola for the tonsillar lesion. -PET scan on  08/14/2018 did not show any evidence of metastatic disease in the neck, chest, abdomen or pelvis.  Small focus of low level increased metabolic activity in the left lobe of the liver, stable to decrease compared to prior exam.  Near complete resolution of previously noted left-sided perinephrotic fluid collection. -CT of the abdomen and pelvis on 12/21/2018 which showed partial pancreatectomy without evidence of local recurrence or metastatic disease.  No enhancing hepatic lesions along the falciform ligament.  No evidence of liver metastasis. -Labs on 07/04/2019 showed LFTs and CA 19 9 were all WNL. -MRI of the abdomen on 08/10/2019 showed no metastatic disease. Mild pancreatic duct dilation mildly increased compared to previous imaging approximately 2 mm. Also showed signs of prior hemorrhage or trauma to the left kidney based on the appearance with non-mass-like signal along the margin of the left kidney. Small renal lesion in the lower pole of the left kidney is too small for definitive characterization due to presence of motion on the current scan this measures 8 mm. -We will see her back in 6 months with repeat labs and scans.  2.  Normocytic anemia: -Last Feraheme was on 07/13/2017 and 07/20/2017. -Labs done on 07/04/2019 showed hemoglobin 13.2  3.  Diarrhea: -She is taking Creon 36,000 units 3 times a day with meals -Diarrhea is stable at this time.     Orders placed this encounter:  Orders Placed This Encounter  Procedures  . CT ABDOMEN PELVIS W CONTRAST  . Lactate dehydrogenase  . CBC with Differential/Platelet  . Comprehensive metabolic panel  . Cancer antigen 19-9      Francene Finders, FNP-C [place] [phone]

## 2019-08-23 NOTE — Assessment & Plan Note (Addendum)
1.  Stage IIb pancreatic adenocarcinoma: -Status post Whipple surgery on 06/21/2013 at [place].  She did not receive any adjuvant chemotherapy due to complicated postoperative course. -PET scan on 04/10/2018 showed no abnormal high signal intensity collection along the lateral margin of the left kidney coinciding with her hematoma.  Small focus of attenuated activity in the lateral segment of the left hepatic lobe near the falciform ligament mildly above the background hepatic activity.  Persistent mild asymmetric activity in the palatine tonsillar region left more than right.  She was seen by Dr. Benjamine Mola for the tonsillar lesion. -PET scan on 08/14/2018 did not show any evidence of metastatic disease in the neck, chest, abdomen or pelvis.  Small focus of low level increased metabolic activity in the left lobe of the liver, stable to decrease compared to prior exam.  Near complete resolution of previously noted left-sided perinephrotic fluid collection. -CT of the abdomen and pelvis on 12/21/2018 which showed partial pancreatectomy without evidence of local recurrence or metastatic disease.  No enhancing hepatic lesions along the falciform ligament.  No evidence of liver metastasis. -Labs on 07/04/2019 showed LFTs and CA 19 9 were all WNL. -MRI of the abdomen on 08/10/2019 showed no metastatic disease. Mild pancreatic duct dilation mildly increased compared to previous imaging approximately 2 mm. Also showed signs of prior hemorrhage or trauma to the left kidney based on the appearance with non-mass-like signal along the margin of the left kidney. Small renal lesion in the lower pole of the left kidney is too small for definitive characterization due to presence of motion on the current scan this measures 8 mm. -We will see her back in 6 months with repeat labs and scans.  2.  Normocytic anemia: -Last Feraheme was on 07/13/2017 and 07/20/2017. -Labs done on 07/04/2019 showed hemoglobin 13.2  3.  Diarrhea: -She  is taking Creon 36,000 units 3 times a day with meals -Diarrhea is stable at this time.

## 2019-09-07 DIAGNOSIS — I129 Hypertensive chronic kidney disease with stage 1 through stage 4 chronic kidney disease, or unspecified chronic kidney disease: Secondary | ICD-10-CM | POA: Diagnosis not present

## 2019-09-07 DIAGNOSIS — N183 Chronic kidney disease, stage 3 unspecified: Secondary | ICD-10-CM | POA: Diagnosis not present

## 2019-09-07 DIAGNOSIS — J449 Chronic obstructive pulmonary disease, unspecified: Secondary | ICD-10-CM | POA: Diagnosis not present

## 2019-09-07 DIAGNOSIS — Z87891 Personal history of nicotine dependence: Secondary | ICD-10-CM | POA: Diagnosis not present

## 2019-09-07 DIAGNOSIS — E7849 Other hyperlipidemia: Secondary | ICD-10-CM | POA: Diagnosis not present

## 2019-10-23 DIAGNOSIS — R300 Dysuria: Secondary | ICD-10-CM | POA: Diagnosis not present

## 2019-10-29 DIAGNOSIS — R300 Dysuria: Secondary | ICD-10-CM | POA: Diagnosis not present

## 2019-11-05 DIAGNOSIS — J449 Chronic obstructive pulmonary disease, unspecified: Secondary | ICD-10-CM | POA: Diagnosis not present

## 2019-11-05 DIAGNOSIS — R748 Abnormal levels of other serum enzymes: Secondary | ICD-10-CM | POA: Diagnosis not present

## 2019-11-05 DIAGNOSIS — N183 Chronic kidney disease, stage 3 unspecified: Secondary | ICD-10-CM | POA: Diagnosis not present

## 2019-11-05 DIAGNOSIS — K219 Gastro-esophageal reflux disease without esophagitis: Secondary | ICD-10-CM | POA: Diagnosis not present

## 2019-11-05 DIAGNOSIS — E782 Mixed hyperlipidemia: Secondary | ICD-10-CM | POA: Diagnosis not present

## 2019-11-08 DIAGNOSIS — I129 Hypertensive chronic kidney disease with stage 1 through stage 4 chronic kidney disease, or unspecified chronic kidney disease: Secondary | ICD-10-CM | POA: Diagnosis not present

## 2019-11-08 DIAGNOSIS — F331 Major depressive disorder, recurrent, moderate: Secondary | ICD-10-CM | POA: Diagnosis not present

## 2019-11-08 DIAGNOSIS — I82891 Chronic embolism and thrombosis of other specified veins: Secondary | ICD-10-CM | POA: Diagnosis not present

## 2019-11-08 DIAGNOSIS — E782 Mixed hyperlipidemia: Secondary | ICD-10-CM | POA: Diagnosis not present

## 2019-11-08 DIAGNOSIS — R4582 Worries: Secondary | ICD-10-CM | POA: Diagnosis not present

## 2019-11-08 DIAGNOSIS — Z23 Encounter for immunization: Secondary | ICD-10-CM | POA: Diagnosis not present

## 2019-11-08 DIAGNOSIS — E876 Hypokalemia: Secondary | ICD-10-CM | POA: Diagnosis not present

## 2019-11-08 DIAGNOSIS — K8681 Exocrine pancreatic insufficiency: Secondary | ICD-10-CM | POA: Diagnosis not present

## 2019-11-08 DIAGNOSIS — I10 Essential (primary) hypertension: Secondary | ICD-10-CM | POA: Diagnosis not present

## 2019-11-08 DIAGNOSIS — E7849 Other hyperlipidemia: Secondary | ICD-10-CM | POA: Diagnosis not present

## 2019-11-08 DIAGNOSIS — N183 Chronic kidney disease, stage 3 unspecified: Secondary | ICD-10-CM | POA: Diagnosis not present

## 2019-11-13 DIAGNOSIS — R4182 Altered mental status, unspecified: Secondary | ICD-10-CM | POA: Diagnosis not present

## 2019-11-13 DIAGNOSIS — R443 Hallucinations, unspecified: Secondary | ICD-10-CM | POA: Diagnosis not present

## 2019-11-13 DIAGNOSIS — R410 Disorientation, unspecified: Secondary | ICD-10-CM | POA: Diagnosis not present

## 2019-12-29 DIAGNOSIS — Z23 Encounter for immunization: Secondary | ICD-10-CM | POA: Diagnosis not present

## 2020-01-31 ENCOUNTER — Other Ambulatory Visit (HOSPITAL_COMMUNITY): Payer: Self-pay

## 2020-01-31 DIAGNOSIS — E871 Hypo-osmolality and hyponatremia: Secondary | ICD-10-CM | POA: Diagnosis not present

## 2020-01-31 DIAGNOSIS — I10 Essential (primary) hypertension: Secondary | ICD-10-CM | POA: Diagnosis not present

## 2020-01-31 DIAGNOSIS — N183 Chronic kidney disease, stage 3 unspecified: Secondary | ICD-10-CM | POA: Diagnosis not present

## 2020-01-31 DIAGNOSIS — C259 Malignant neoplasm of pancreas, unspecified: Secondary | ICD-10-CM

## 2020-01-31 DIAGNOSIS — K219 Gastro-esophageal reflux disease without esophagitis: Secondary | ICD-10-CM | POA: Diagnosis not present

## 2020-01-31 DIAGNOSIS — R748 Abnormal levels of other serum enzymes: Secondary | ICD-10-CM | POA: Diagnosis not present

## 2020-01-31 DIAGNOSIS — Z1329 Encounter for screening for other suspected endocrine disorder: Secondary | ICD-10-CM | POA: Diagnosis not present

## 2020-01-31 DIAGNOSIS — E782 Mixed hyperlipidemia: Secondary | ICD-10-CM | POA: Diagnosis not present

## 2020-02-05 DIAGNOSIS — I82891 Chronic embolism and thrombosis of other specified veins: Secondary | ICD-10-CM | POA: Diagnosis not present

## 2020-02-05 DIAGNOSIS — I10 Essential (primary) hypertension: Secondary | ICD-10-CM | POA: Diagnosis not present

## 2020-02-05 DIAGNOSIS — E876 Hypokalemia: Secondary | ICD-10-CM | POA: Diagnosis not present

## 2020-02-05 DIAGNOSIS — K8681 Exocrine pancreatic insufficiency: Secondary | ICD-10-CM | POA: Diagnosis not present

## 2020-02-05 DIAGNOSIS — R4582 Worries: Secondary | ICD-10-CM | POA: Diagnosis not present

## 2020-02-05 DIAGNOSIS — F331 Major depressive disorder, recurrent, moderate: Secondary | ICD-10-CM | POA: Diagnosis not present

## 2020-02-05 DIAGNOSIS — K2100 Gastro-esophageal reflux disease with esophagitis, without bleeding: Secondary | ICD-10-CM | POA: Diagnosis not present

## 2020-02-05 DIAGNOSIS — E782 Mixed hyperlipidemia: Secondary | ICD-10-CM | POA: Diagnosis not present

## 2020-02-08 DIAGNOSIS — I129 Hypertensive chronic kidney disease with stage 1 through stage 4 chronic kidney disease, or unspecified chronic kidney disease: Secondary | ICD-10-CM | POA: Diagnosis not present

## 2020-02-08 DIAGNOSIS — N183 Chronic kidney disease, stage 3 unspecified: Secondary | ICD-10-CM | POA: Diagnosis not present

## 2020-02-08 DIAGNOSIS — E7849 Other hyperlipidemia: Secondary | ICD-10-CM | POA: Diagnosis not present

## 2020-02-21 ENCOUNTER — Ambulatory Visit (HOSPITAL_COMMUNITY): Payer: Medicare Other

## 2020-02-21 ENCOUNTER — Other Ambulatory Visit (HOSPITAL_COMMUNITY): Payer: Medicare Other

## 2020-02-27 ENCOUNTER — Other Ambulatory Visit (HOSPITAL_COMMUNITY): Payer: Self-pay

## 2020-02-27 DIAGNOSIS — C259 Malignant neoplasm of pancreas, unspecified: Secondary | ICD-10-CM

## 2020-02-27 MED ORDER — PANCRELIPASE (LIP-PROT-AMYL) 36000-114000 UNITS PO CPEP: 36000.0000 [IU] | ORAL_CAPSULE | Freq: Three times a day (TID) | ORAL | 6 refills | Status: DC

## 2020-02-28 ENCOUNTER — Ambulatory Visit (HOSPITAL_COMMUNITY): Payer: Medicare Other | Admitting: Hematology

## 2020-03-14 ENCOUNTER — Inpatient Hospital Stay (HOSPITAL_COMMUNITY): Payer: Medicare Other | Attending: Hematology

## 2020-03-14 ENCOUNTER — Ambulatory Visit (HOSPITAL_COMMUNITY)
Admission: RE | Admit: 2020-03-14 | Discharge: 2020-03-14 | Disposition: A | Discharge status: Home / Self Care | Payer: Medicare Other | Source: Ambulatory Visit | Attending: Nurse Practitioner | Admitting: Nurse Practitioner

## 2020-03-14 ENCOUNTER — Other Ambulatory Visit: Payer: Self-pay

## 2020-03-14 DIAGNOSIS — D649 Anemia, unspecified: Secondary | ICD-10-CM | POA: Diagnosis not present

## 2020-03-14 DIAGNOSIS — N281 Cyst of kidney, acquired: Secondary | ICD-10-CM | POA: Diagnosis not present

## 2020-03-14 DIAGNOSIS — R197 Diarrhea, unspecified: Secondary | ICD-10-CM | POA: Diagnosis not present

## 2020-03-14 DIAGNOSIS — Z87891 Personal history of nicotine dependence: Secondary | ICD-10-CM | POA: Diagnosis not present

## 2020-03-14 DIAGNOSIS — Z79899 Other long term (current) drug therapy: Secondary | ICD-10-CM | POA: Insufficient documentation

## 2020-03-14 DIAGNOSIS — C259 Malignant neoplasm of pancreas, unspecified: Secondary | ICD-10-CM | POA: Insufficient documentation

## 2020-03-14 DIAGNOSIS — Z8719 Personal history of other diseases of the digestive system: Secondary | ICD-10-CM | POA: Diagnosis not present

## 2020-03-14 DIAGNOSIS — M4319 Spondylolisthesis, multiple sites in spine: Secondary | ICD-10-CM | POA: Diagnosis not present

## 2020-03-14 LAB — CBC WITH DIFFERENTIAL/PLATELET
Abs Immature Granulocytes: 0.03 10*3/uL (ref 0.00–0.07)
Basophils Absolute: 0.1 10*3/uL (ref 0.0–0.1)
Basophils Relative: 1 %
Eosinophils Absolute: 0.4 10*3/uL (ref 0.0–0.5)
Eosinophils Relative: 4 %
HCT: 43.5 % (ref 36.0–46.0)
Hemoglobin: 13.5 g/dL (ref 12.0–15.0)
Immature Granulocytes: 0 %
Lymphocytes Relative: 29 %
Lymphs Abs: 3.7 10*3/uL (ref 0.7–4.0)
MCH: 28.2 pg (ref 26.0–34.0)
MCHC: 31 g/dL (ref 30.0–36.0)
MCV: 91 fL (ref 80.0–100.0)
Monocytes Absolute: 0.9 10*3/uL (ref 0.1–1.0)
Monocytes Relative: 7 %
Neutro Abs: 7.5 10*3/uL (ref 1.7–7.7)
Neutrophils Relative %: 59 %
Platelets: 332 10*3/uL (ref 150–400)
RBC: 4.78 MIL/uL (ref 3.87–5.11)
RDW: 17.3 % — ABNORMAL HIGH (ref 11.5–15.5)
WBC: 12.7 10*3/uL — ABNORMAL HIGH (ref 4.0–10.5)
nRBC: 0 % (ref 0.0–0.2)

## 2020-03-14 LAB — LACTATE DEHYDROGENASE: LDH: 165 U/L (ref 98–192)

## 2020-03-14 LAB — COMPREHENSIVE METABOLIC PANEL
ALT: 24 U/L (ref 0–44)
AST: 28 U/L (ref 15–41)
Albumin: 4 g/dL (ref 3.5–5.0)
Alkaline Phosphatase: 101 U/L (ref 38–126)
Anion gap: 12 (ref 5–15)
BUN: 16 mg/dL (ref 8–23)
CO2: 24 mmol/L (ref 22–32)
Calcium: 8.3 mg/dL — ABNORMAL LOW (ref 8.9–10.3)
Chloride: 102 mmol/L (ref 98–111)
Creatinine, Ser: 1.24 mg/dL — ABNORMAL HIGH (ref 0.44–1.00)
GFR, Estimated: 47 mL/min — ABNORMAL LOW (ref >60)
Glucose, Bld: 86 mg/dL (ref 70–99)
Potassium: 4.7 mmol/L (ref 3.5–5.1)
Sodium: 138 mmol/L (ref 135–145)
Total Bilirubin: 0.9 mg/dL (ref 0.3–1.2)
Total Protein: 7.5 g/dL (ref 6.5–8.1)

## 2020-03-14 MED ORDER — IOHEXOL 300 MG/ML  SOLN
100.0000 mL | Freq: Once | INTRAMUSCULAR | Status: AC | PRN
  Administered 2020-03-14: 80 mL via INTRAVENOUS

## 2020-03-15 LAB — CANCER ANTIGEN 19-9: CA 19-9: 19 U/mL (ref 0–35)

## 2020-03-18 ENCOUNTER — Other Ambulatory Visit: Payer: Self-pay

## 2020-03-18 ENCOUNTER — Inpatient Hospital Stay (HOSPITAL_BASED_OUTPATIENT_CLINIC_OR_DEPARTMENT_OTHER): Payer: Medicare Other | Admitting: Hematology

## 2020-03-18 VITALS — BP 135/73 | HR 92 | Temp 98.1°F | Resp 20 | Wt 122.5 lb

## 2020-03-18 DIAGNOSIS — Z87891 Personal history of nicotine dependence: Secondary | ICD-10-CM | POA: Diagnosis not present

## 2020-03-18 DIAGNOSIS — Z79899 Other long term (current) drug therapy: Secondary | ICD-10-CM | POA: Diagnosis not present

## 2020-03-18 DIAGNOSIS — D649 Anemia, unspecified: Secondary | ICD-10-CM | POA: Diagnosis not present

## 2020-03-18 DIAGNOSIS — R197 Diarrhea, unspecified: Secondary | ICD-10-CM | POA: Diagnosis not present

## 2020-03-18 DIAGNOSIS — C259 Malignant neoplasm of pancreas, unspecified: Secondary | ICD-10-CM

## 2020-03-18 NOTE — Progress Notes (Signed)
Maureen Vaughn, [place] [address]   CLINIC:  Medical Oncology/Hematology  PCP:  Caryl Bis, MD [address] [place] [address] [phone]   REASON FOR VISIT:  Follow-up for pancreatic cancer  PRIOR THERAPY: Whipple surgery on 06/21/2013  NGS Results: Not done  CURRENT THERAPY: Creon TID  BRIEF ONCOLOGIC HISTORY:  Oncology History  Adenocarcinoma of pancreas ([place])  05/23/2013 Tumor Marker   Patient's tumor was tested for the following markers: CA 19-9. Results of the tumor marker test revealed 86.   05/24/2013 Tumor Marker   Patient's tumor was tested for the following markers: CEA. Results of the tumor marker test revealed 5.   06/18/2013 Procedure   Malignant distal CBD stricture in distal 2-3cm of CBD- 10 x 35mm covered metal stent placed      06/21/2013 Procedure   Diagnostic laparoscopy, open pylorus preserving pancreaticoduodenectomy, omentectomy by Dr. 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 Patient status post Whipple procedure without evidence of pancreatic carcinoma recurrence or metastasis. 2. No surgical complication identified. 3. Bilateral pars defects with grade 1 anterolisthesis at L5-S1.   06/21/2016 Imaging   Ct abd/pelvis- Stable postop changes from Whipple procedure. No evidence of recurrent or metastatic carcinoma within abdomen.  Incidentally noted aortic atherosclerosis and bilateral L5 pars defects, with degenerative disc disease and grade 2 anterolisthesis at L5-S1     CANCER STAGING: Cancer  Staging Adenocarcinoma of pancreas [facility]) Staging form: Pancreas, AJCC 7th Edition - Pathologic stage from 06/26/2013: Stage IIB (T3, N1, cM0) - Signed by Baird Cancer, PA-C on 12/23/2015   INTERVAL HISTORY:  [name], a 70 y.o. female, returns for routine follow-up of her pancreatic cancer. Maureen Vaughn was last seen by Francene Finders, NP, on 08/23/2019.   Today she is accompanied by her friend and she reports feeling well. She denies having any changes in her overall health. Her appetite is excellent though she reports having occasional diarrhea and abdominal pain. She is taking Creon prior to eating and is tolerating it well.   REVIEW OF SYSTEMS:  Review of Systems  Constitutional: Positive for fatigue (75%). Negative for appetite change.  Gastrointestinal: Positive for abdominal pain (occasional) and diarrhea (occasional).  All other systems reviewed and are negative.   PAST MEDICAL/SURGICAL HISTORY:  Past Medical History:  Diagnosis Date  . Adenocarcinoma of pancreas ([place]) 12/23/2015  . Cancer [facility])    pancreatic  . Chronic abdominal pain   . Chronic diarrhea   . Depression   . Falls frequently   . Fracture of right humerus   . Hip fracture, right ([place])   . Hypotension    Past Surgical History:  Procedure Laterality Date  . GASTROSTOMY TUBE PLACEMENT      SOCIAL HISTORY:  Social History   Socioeconomic History  . Marital status: Legally Separated    Spouse name: Not on file  . Number of children: Not on file  . Years of education: Not on file  . Highest education level: Not on file  Occupational History  . Not on file  Tobacco Use  .  Smoking status: Former Smoker    Packs/day: 2.00    Years: 35.00    Pack years: 70.00    Quit date: 12/23/2010    Years since quitting: 9.2  . Smokeless tobacco: Never Used  Substance and Sexual Activity  . 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 Drug use: No  . Sexual  activity: Not on file  Other Topics Concern  . Not on file  Social History Narrative  . Not on file   Social Determinants of Health   Financial Resource Strain: Not on file  Food Insecurity: Not on file  Transportation Needs: Not on file  Physical Activity: Not on file  Stress: Not on file  Social Connections: Not on file  Intimate Partner Violence: Not on file    FAMILY HISTORY:  Family History  Adopted: Yes    CURRENT MEDICATIONS:  Current Outpatient Medications  Medication Sig Dispense Refill  . atorvastatin (LIPITOR) 20 MG tablet Take 20 mg by mouth at bedtime.    . Cranberry-Vitamin C-Probiotic (AZO CRANBERRY) 250-30 MG TABS Take 1 tablet by mouth daily.    . diazepam (VALIUM) 5 MG tablet Take 5 mg by mouth 2 (two) times daily.     . DULoxetine (CYMBALTA) 60 MG capsule Take 60 mg by mouth daily.  11  . ibandronate (BONIVA) 150 MG tablet Take by mouth every 30 (thirty) days.     Marland Kitchen lipase/protease/amylase (CREON) 36000 UNITS CPEP capsule Take 1 capsule (36,000 Units total) by mouth 3 (three) times daily with meals. 90 capsule 6  . metoprolol succinate (TOPROL-XL) 25 MG 24 hr tablet Take 12.5 mg by mouth daily.     Marland Kitchen omeprazole (PRILOSEC) 40 MG capsule Take 40 mg by mouth daily.  5  . ondansetron (ZOFRAN) 4 MG tablet Take 4 mg by mouth every 8 (eight) hours as needed.  5  . potassium chloride (K-DUR,KLOR-CON) 10 MEQ tablet Take 10 mEq by mouth daily.     . SYMBICORT 160-4.5 MCG/ACT inhaler Inhale 2 puffs into the lungs 2 (two) times daily.    . traMADol (ULTRAM) 50 MG tablet 50 mg every 6 (six) hours as needed.   2   No current facility-administered medications for this visit.    ALLERGIES:  No Known Allergies  PHYSICAL EXAM:  Performance status (ECOG): 1 - Symptomatic but completely ambulatory  Vitals:   03/18/20 1214  BP: 135/73  Pulse: 92  Resp: 20  Temp: 98.1 F (36.7 C)  SpO2: 100%   Wt Readings from Last 3 Encounters:  03/18/20 122 lb 8 oz (55.6 kg)   08/23/19 131 lb 3.2 oz (59.5 kg)  12/28/18 121 lb (54.9 kg)   Physical Exam Vitals reviewed.  Constitutional:      Appearance: Normal appearance.  Cardiovascular:     Rate and Rhythm: Normal rate and regular rhythm.     Pulses: Normal pulses.     Heart sounds: Normal heart sounds.  Pulmonary:     Effort: Pulmonary effort is normal.     Breath sounds: Normal breath sounds.  Abdominal:     Palpations: Abdomen is soft. There is no mass.     Tenderness: There is abdominal tenderness (soreness) in the right lower quadrant.     Hernia: No hernia is present.  Musculoskeletal:     Right lower leg: No edema.     Left lower leg: No edema.  Neurological:  General: No focal deficit present.     Mental Status: She is alert and oriented to person, place, and time.  Psychiatric:        Mood and Affect: Mood normal.        Behavior: Behavior normal.      LABORATORY DATA:  I have reviewed the labs as listed.  CBC Latest Ref Rng & Units 03/14/2020 07/04/2019 12/21/2018  WBC 4.0 - 10.5 K/uL 12.7(H) 8.9 9.3  Hemoglobin 12.0 - 15.0 g/dL 13.5 13.2 12.3  Hematocrit 36.0 - 46.0 % 43.5 42.9 40.0  Platelets 150 - 400 K/uL 332 317 316   CMP Latest Ref Rng & Units 03/14/2020 07/04/2019 12/21/2018  Glucose 70 - 99 mg/dL 86 95 92  BUN 8 - 23 mg/dL 16 13 16   Creatinine 0.44 - 1.00 mg/dL 1.24(H) 0.97 0.87  Sodium 135 - 145 mmol/L 138 141 141  Potassium 3.5 - 5.1 mmol/L 4.7 4.4 4.6  Chloride 98 - 111 mmol/L 102 106 101  CO2 22 - 32 mmol/L 24 26 28   Calcium 8.9 - 10.3 mg/dL 8.3(L) 9.0 9.3  Total Protein 6.5 - 8.1 g/dL 7.5 7.2 7.0  Total Bilirubin 0.3 - 1.2 mg/dL 0.9 1.0 0.8  Alkaline Phos 38 - 126 U/L 101 105 77  AST 15 - 41 U/L 28 25 41  ALT 0 - 44 U/L 24 22 31    Lab Results  Component Value Date   LDH 165 03/14/2020   LDH 151 04/10/2018   Lab Results  Component Value Date   OMV672 19 03/14/2020   CNO709 14 07/04/2019   CAN199 15 12/21/2018    DIAGNOSTIC IMAGING:  I have independently  reviewed the scans and discussed with the patient. CT ABDOMEN W WO CONTRAST  Result Date: 03/14/2020 CLINICAL DATA:  Follow-up pancreatic carcinoma. Previous Whipple procedure. History of liver lesion. EXAM: CT ABDOMEN WITHOUT AND WITH CONTRAST TECHNIQUE: Multidetector CT imaging of the abdomen was performed following the standard protocol before and following the bolus administration of intravenous contrast. CONTRAST:  33mL OMNIPAQUE IOHEXOL 300 MG/ML  SOLN COMPARISON:  12/21/2018 FINDINGS: Lower chest: No acute findings. Hepatobiliary: No hepatic masses identified. No evidence of biliary obstruction. Pneumobilia noted, consistent with prior Whipple procedure. Pancreas: Expected postop changes from Whipple procedure again seen. No evidence of recurrent mass or pancreatic ductal dilatation. No evidence of peripancreatic inflammatory changes or fluid collections. Spleen:  Within normal limits in size and appearance. Adrenals/Urinary Tract: No masses identified. Tiny sub-cm renal cysts are stable. No evidence of hydronephrosis. Stomach/Bowel: Visualized portion unremarkable. Vascular/Lymphatic: No pathologically enlarged lymph nodes identified. No abdominal aortic aneurysm. Aortic atherosclerotic calcification noted. Other:  None. Musculoskeletal: No suspicious bone lesions identified. Bilateral L5 pars defects again noted with grade 2 anterolisthesis at L5-S1 measuring 11 mm. IMPRESSION: Stable postop changes from Whipple procedure. No evidence of recurrent or metastatic carcinoma within the abdomen. Aortic Atherosclerosis (ICD10-I70.0). Electronically Signed   By: Marlaine Hind M.D.   On: 03/14/2020 14:13     ASSESSMENT:  1.  Stage IIb pancreatic adenocarcinoma: -Status post Whipple surgery on 06/21/2013 at [facility].  She did not receive any adjuvant chemotherapy due to complicated postoperative course. -PET scan on 04/10/2018 showed no abnormal high signal intensity collection along the lateral margin of  the left kidney coinciding with her hematoma.  Small focus of attenuated activity in the lateral segment of the left hepatic lobe near the falciform ligament mildly above the background hepatic activity.  Persistent mild asymmetric activity in the  palatine tonsillar region left more than right.  She was seen by Dr. Benjamine Mola for the tonsillar lesion. -PET scan on 08/14/2018 did not show any evidence of metastatic disease in the neck, chest, abdomen or pelvis.  Small focus of low level increased metabolic activity in the left lobe of the liver, stable to decrease compared to prior exam.  Near complete resolution of previously noted left-sided perinephrotic fluid collection. -CT of the abdomen and pelvis on 12/21/2018 which showed partial pancreatectomy without evidence of local recurrence or metastatic disease.  No enhancing hepatic lesions along the falciform ligament.  No evidence of liver metastasis. -Labs on 07/04/2019 showed LFTs and CA 19 9 were all WNL. -MRI of the abdomen on 08/10/2019 showed no metastatic disease. Mild pancreatic duct dilation mildly increased compared to previous imaging approximately 2 mm. Also showed signs of prior hemorrhage or trauma to the left kidney based on the appearance with non-mass-like signal along the margin of the left kidney. Small renal lesion in the lower pole of the left kidney is too small for definitive characterization due to presence of motion on the current scan this measures 8 mm. -CT AP on 03/14/2020 with no evidence of recurrence.  2.  Normocytic anemia: -Last Feraheme was on 07/13/2017 and 07/20/2017.  3.  Diarrhea: -She is taking Creon 36,000 units 3 times a day with meals.    PLAN:  1.  Stage IIb pancreatic adenocarcinoma: -She does not have any clinical signs or symptoms of recurrence. -Reviewed CT scan results from 03/14/2020 which did not show any evidence of recurrence. -Labs from same day showed normal CA 19-9. -RTC 6 months for follow-up with repeat  tumor marker and labs.  Will do CT scan only if clinical condition dictates.  2.  Normocytic anemia: -Hemoglobin was 13.5.  No indication for parenteral iron therapy.  3.  Diarrhea: -Continue Creon 3 times daily with meals.    Orders placed this encounter:  No orders of the defined types were placed in this encounter.    Derek Jack, MD [place] [phone]   I, Milinda Antis, am acting as a scribe for Dr. Sanda Linger.  I, Derek Jack MD, have reviewed the above documentation for accuracy and completeness, and I agree with the above.

## 2020-03-18 NOTE — Patient Instructions (Signed)
[place] at [facility] Discharge Instructions  You were seen today by Dr. Delton Coombes. He went over your recent results and scans. Continue drinking plenty of water daily to keep your kidneys hydrated. Your next appointment will be with the nurse practitioner in 6 months for labs and follow up.   Thank you for choosing [place] at [facility] to provide your oncology and hematology care.  To afford each patient quality time with our provider, please arrive at least 15 minutes before your scheduled appointment time.   If you have a lab appointment with the [facility] please come in thru the Main Entrance and check in at the main information desk  You need to re-schedule your appointment should you arrive 10 or more minutes late.  We strive to give you quality time with our providers, and arriving late affects you and other patients whose appointments are after yours.  Also, if you no show three or more times for appointments you may be dismissed from the clinic at the providers discretion.     Again, thank you for choosing [facility].  Our hope is that these requests will decrease the amount of time that you wait before being seen by our physicians.       _____________________________________________________________  Should you have questions after your visit to [facility], please contact our office at [phone] between the hours of 8:00 a.m. and 4:30 p.m.  Voicemails left after 4:00 p.m. will not be returned until the following business day.  For prescription refill requests, have your pharmacy contact our office and allow 72 hours.    Cancer Center Support Programs:   > Cancer Support Group  2nd Tuesday of the month 1pm-2pm, Journey Room

## 2020-05-14 DIAGNOSIS — S0083XA Contusion of other part of head, initial encounter: Secondary | ICD-10-CM | POA: Diagnosis not present

## 2020-05-14 DIAGNOSIS — W1839XA Other fall on same level, initial encounter: Secondary | ICD-10-CM | POA: Diagnosis not present

## 2020-05-14 DIAGNOSIS — I700 Atherosclerosis of aorta: Secondary | ICD-10-CM | POA: Diagnosis not present

## 2020-05-14 DIAGNOSIS — J342 Deviated nasal septum: Secondary | ICD-10-CM | POA: Diagnosis not present

## 2020-05-14 DIAGNOSIS — R519 Headache, unspecified: Secondary | ICD-10-CM | POA: Diagnosis not present

## 2020-05-14 DIAGNOSIS — R220 Localized swelling, mass and lump, head: Secondary | ICD-10-CM | POA: Diagnosis not present

## 2020-05-14 DIAGNOSIS — S0003XA Contusion of scalp, initial encounter: Secondary | ICD-10-CM | POA: Diagnosis not present

## 2020-05-14 DIAGNOSIS — Z043 Encounter for examination and observation following other accident: Secondary | ICD-10-CM | POA: Diagnosis not present

## 2020-06-06 DIAGNOSIS — R7303 Prediabetes: Secondary | ICD-10-CM | POA: Diagnosis not present

## 2020-06-06 DIAGNOSIS — I10 Essential (primary) hypertension: Secondary | ICD-10-CM | POA: Diagnosis not present

## 2020-06-06 DIAGNOSIS — N189 Chronic kidney disease, unspecified: Secondary | ICD-10-CM | POA: Diagnosis not present

## 2020-06-06 DIAGNOSIS — N183 Chronic kidney disease, stage 3 unspecified: Secondary | ICD-10-CM | POA: Diagnosis not present

## 2020-06-06 DIAGNOSIS — E782 Mixed hyperlipidemia: Secondary | ICD-10-CM | POA: Diagnosis not present

## 2020-06-06 DIAGNOSIS — E7849 Other hyperlipidemia: Secondary | ICD-10-CM | POA: Diagnosis not present

## 2020-06-06 DIAGNOSIS — Z1321 Encounter for screening for nutritional disorder: Secondary | ICD-10-CM | POA: Diagnosis not present

## 2020-06-06 DIAGNOSIS — K219 Gastro-esophageal reflux disease without esophagitis: Secondary | ICD-10-CM | POA: Diagnosis not present

## 2020-06-07 DIAGNOSIS — I129 Hypertensive chronic kidney disease with stage 1 through stage 4 chronic kidney disease, or unspecified chronic kidney disease: Secondary | ICD-10-CM | POA: Diagnosis not present

## 2020-06-07 DIAGNOSIS — E7849 Other hyperlipidemia: Secondary | ICD-10-CM | POA: Diagnosis not present

## 2020-06-07 DIAGNOSIS — N183 Chronic kidney disease, stage 3 unspecified: Secondary | ICD-10-CM | POA: Diagnosis not present

## 2020-06-07 DIAGNOSIS — J449 Chronic obstructive pulmonary disease, unspecified: Secondary | ICD-10-CM | POA: Diagnosis not present

## 2020-06-07 DIAGNOSIS — Z87891 Personal history of nicotine dependence: Secondary | ICD-10-CM | POA: Diagnosis not present

## 2020-07-07 DIAGNOSIS — Z87891 Personal history of nicotine dependence: Secondary | ICD-10-CM | POA: Diagnosis not present

## 2020-07-07 DIAGNOSIS — E7849 Other hyperlipidemia: Secondary | ICD-10-CM | POA: Diagnosis not present

## 2020-07-07 DIAGNOSIS — N183 Chronic kidney disease, stage 3 unspecified: Secondary | ICD-10-CM | POA: Diagnosis not present

## 2020-07-07 DIAGNOSIS — J449 Chronic obstructive pulmonary disease, unspecified: Secondary | ICD-10-CM | POA: Diagnosis not present

## 2020-07-07 DIAGNOSIS — I129 Hypertensive chronic kidney disease with stage 1 through stage 4 chronic kidney disease, or unspecified chronic kidney disease: Secondary | ICD-10-CM | POA: Diagnosis not present

## 2020-07-25 DIAGNOSIS — K5790 Diverticulosis of intestine, part unspecified, without perforation or abscess without bleeding: Secondary | ICD-10-CM | POA: Diagnosis not present

## 2020-07-25 DIAGNOSIS — R059 Cough, unspecified: Secondary | ICD-10-CM | POA: Diagnosis not present

## 2020-07-25 DIAGNOSIS — Z20822 Contact with and (suspected) exposure to covid-19: Secondary | ICD-10-CM | POA: Diagnosis not present

## 2020-07-25 DIAGNOSIS — N390 Urinary tract infection, site not specified: Secondary | ICD-10-CM | POA: Diagnosis not present

## 2020-07-25 DIAGNOSIS — J9811 Atelectasis: Secondary | ICD-10-CM | POA: Diagnosis not present

## 2020-07-25 DIAGNOSIS — K5730 Diverticulosis of large intestine without perforation or abscess without bleeding: Secondary | ICD-10-CM | POA: Diagnosis not present

## 2020-07-25 DIAGNOSIS — Z8507 Personal history of malignant neoplasm of pancreas: Secondary | ICD-10-CM | POA: Diagnosis not present

## 2020-07-25 DIAGNOSIS — R197 Diarrhea, unspecified: Secondary | ICD-10-CM | POA: Diagnosis not present

## 2020-07-25 DIAGNOSIS — I700 Atherosclerosis of aorta: Secondary | ICD-10-CM | POA: Diagnosis not present

## 2020-07-25 DIAGNOSIS — R109 Unspecified abdominal pain: Secondary | ICD-10-CM | POA: Diagnosis not present

## 2020-07-28 DIAGNOSIS — R300 Dysuria: Secondary | ICD-10-CM | POA: Diagnosis not present

## 2020-07-31 DIAGNOSIS — R4582 Worries: Secondary | ICD-10-CM | POA: Diagnosis not present

## 2020-07-31 DIAGNOSIS — Z5181 Encounter for therapeutic drug level monitoring: Secondary | ICD-10-CM | POA: Diagnosis not present

## 2020-07-31 DIAGNOSIS — I82891 Chronic embolism and thrombosis of other specified veins: Secondary | ICD-10-CM | POA: Diagnosis not present

## 2020-07-31 DIAGNOSIS — E876 Hypokalemia: Secondary | ICD-10-CM | POA: Diagnosis not present

## 2020-07-31 DIAGNOSIS — E7849 Other hyperlipidemia: Secondary | ICD-10-CM | POA: Diagnosis not present

## 2020-07-31 DIAGNOSIS — I10 Essential (primary) hypertension: Secondary | ICD-10-CM | POA: Diagnosis not present

## 2020-07-31 DIAGNOSIS — K8681 Exocrine pancreatic insufficiency: Secondary | ICD-10-CM | POA: Diagnosis not present

## 2020-07-31 DIAGNOSIS — Z23 Encounter for immunization: Secondary | ICD-10-CM | POA: Diagnosis not present

## 2020-07-31 DIAGNOSIS — M1612 Unilateral primary osteoarthritis, left hip: Secondary | ICD-10-CM | POA: Diagnosis not present

## 2020-08-07 DIAGNOSIS — N183 Chronic kidney disease, stage 3 unspecified: Secondary | ICD-10-CM | POA: Diagnosis not present

## 2020-08-07 DIAGNOSIS — E7849 Other hyperlipidemia: Secondary | ICD-10-CM | POA: Diagnosis not present

## 2020-08-07 DIAGNOSIS — Z87891 Personal history of nicotine dependence: Secondary | ICD-10-CM | POA: Diagnosis not present

## 2020-08-07 DIAGNOSIS — I129 Hypertensive chronic kidney disease with stage 1 through stage 4 chronic kidney disease, or unspecified chronic kidney disease: Secondary | ICD-10-CM | POA: Diagnosis not present

## 2020-08-07 DIAGNOSIS — J449 Chronic obstructive pulmonary disease, unspecified: Secondary | ICD-10-CM | POA: Diagnosis not present

## 2020-08-12 DIAGNOSIS — Z0001 Encounter for general adult medical examination with abnormal findings: Secondary | ICD-10-CM | POA: Diagnosis not present

## 2020-08-12 DIAGNOSIS — N189 Chronic kidney disease, unspecified: Secondary | ICD-10-CM | POA: Diagnosis not present

## 2020-08-12 DIAGNOSIS — Z6824 Body mass index (BMI) 24.0-24.9, adult: Secondary | ICD-10-CM | POA: Diagnosis not present

## 2020-08-12 DIAGNOSIS — E7849 Other hyperlipidemia: Secondary | ICD-10-CM | POA: Diagnosis not present

## 2020-08-12 DIAGNOSIS — N183 Chronic kidney disease, stage 3 unspecified: Secondary | ICD-10-CM | POA: Diagnosis not present

## 2020-08-12 DIAGNOSIS — I10 Essential (primary) hypertension: Secondary | ICD-10-CM | POA: Diagnosis not present

## 2020-08-12 DIAGNOSIS — J449 Chronic obstructive pulmonary disease, unspecified: Secondary | ICD-10-CM | POA: Diagnosis not present

## 2020-08-19 ENCOUNTER — Other Ambulatory Visit (HOSPITAL_COMMUNITY): Payer: Self-pay | Admitting: Hematology

## 2020-08-19 DIAGNOSIS — C259 Malignant neoplasm of pancreas, unspecified: Secondary | ICD-10-CM

## 2020-09-15 ENCOUNTER — Inpatient Hospital Stay (HOSPITAL_COMMUNITY): Payer: Medicare Other

## 2020-09-22 ENCOUNTER — Ambulatory Visit (HOSPITAL_COMMUNITY): Payer: Medicare Other | Admitting: Hematology

## 2020-09-23 ENCOUNTER — Inpatient Hospital Stay (HOSPITAL_COMMUNITY): Payer: Medicare Other | Attending: Hematology

## 2020-09-23 ENCOUNTER — Other Ambulatory Visit: Payer: Self-pay

## 2020-09-23 DIAGNOSIS — R197 Diarrhea, unspecified: Secondary | ICD-10-CM | POA: Diagnosis not present

## 2020-09-23 DIAGNOSIS — Z87891 Personal history of nicotine dependence: Secondary | ICD-10-CM | POA: Diagnosis not present

## 2020-09-23 DIAGNOSIS — C250 Malignant neoplasm of head of pancreas: Secondary | ICD-10-CM | POA: Insufficient documentation

## 2020-09-23 DIAGNOSIS — D649 Anemia, unspecified: Secondary | ICD-10-CM | POA: Diagnosis not present

## 2020-09-23 DIAGNOSIS — C259 Malignant neoplasm of pancreas, unspecified: Secondary | ICD-10-CM

## 2020-09-23 LAB — CBC WITH DIFFERENTIAL/PLATELET
Abs Immature Granulocytes: 0.03 10*3/uL (ref 0.00–0.07)
Basophils Absolute: 0.1 10*3/uL (ref 0.0–0.1)
Basophils Relative: 1 %
Eosinophils Absolute: 0.3 10*3/uL (ref 0.0–0.5)
Eosinophils Relative: 4 %
HCT: 40.1 % (ref 36.0–46.0)
Hemoglobin: 12.6 g/dL (ref 12.0–15.0)
Immature Granulocytes: 0 %
Lymphocytes Relative: 30 %
Lymphs Abs: 2.3 10*3/uL (ref 0.7–4.0)
MCH: 29.1 pg (ref 26.0–34.0)
MCHC: 31.4 g/dL (ref 30.0–36.0)
MCV: 92.6 fL (ref 80.0–100.0)
Monocytes Absolute: 0.5 10*3/uL (ref 0.1–1.0)
Monocytes Relative: 6 %
Neutro Abs: 4.6 10*3/uL (ref 1.7–7.7)
Neutrophils Relative %: 59 %
Platelets: 274 10*3/uL (ref 150–400)
RBC: 4.33 MIL/uL (ref 3.87–5.11)
RDW: 14.6 % (ref 11.5–15.5)
WBC: 7.9 10*3/uL (ref 4.0–10.5)
nRBC: 0 % (ref 0.0–0.2)

## 2020-09-23 LAB — COMPREHENSIVE METABOLIC PANEL
ALT: 20 U/L (ref 0–44)
AST: 29 U/L (ref 15–41)
Albumin: 3.9 g/dL (ref 3.5–5.0)
Alkaline Phosphatase: 89 U/L (ref 38–126)
Anion gap: 4 — ABNORMAL LOW (ref 5–15)
BUN: 12 mg/dL (ref 8–23)
CO2: 26 mmol/L (ref 22–32)
Calcium: 9 mg/dL (ref 8.9–10.3)
Chloride: 106 mmol/L (ref 98–111)
Creatinine, Ser: 1.02 mg/dL — ABNORMAL HIGH (ref 0.44–1.00)
GFR, Estimated: 60 mL/min — ABNORMAL LOW (ref >60)
Glucose, Bld: 123 mg/dL — ABNORMAL HIGH (ref 70–99)
Potassium: 4.6 mmol/L (ref 3.5–5.1)
Sodium: 136 mmol/L (ref 135–145)
Total Bilirubin: 0.8 mg/dL (ref 0.3–1.2)
Total Protein: 7 g/dL (ref 6.5–8.1)

## 2020-09-24 LAB — CANCER ANTIGEN 19-9: CA 19-9: 16 U/mL (ref 0–35)

## 2020-09-30 ENCOUNTER — Inpatient Hospital Stay (HOSPITAL_BASED_OUTPATIENT_CLINIC_OR_DEPARTMENT_OTHER): Payer: Medicare Other | Admitting: Hematology and Oncology

## 2020-09-30 ENCOUNTER — Other Ambulatory Visit: Payer: Self-pay

## 2020-09-30 VITALS — BP 112/59 | HR 61 | Temp 96.8°F | Resp 16 | Wt 119.7 lb

## 2020-09-30 DIAGNOSIS — C259 Malignant neoplasm of pancreas, unspecified: Secondary | ICD-10-CM

## 2020-09-30 DIAGNOSIS — D649 Anemia, unspecified: Secondary | ICD-10-CM | POA: Diagnosis not present

## 2020-09-30 DIAGNOSIS — C250 Malignant neoplasm of head of pancreas: Secondary | ICD-10-CM | POA: Diagnosis not present

## 2020-09-30 DIAGNOSIS — Z87891 Personal history of nicotine dependence: Secondary | ICD-10-CM | POA: Diagnosis not present

## 2020-09-30 DIAGNOSIS — R197 Diarrhea, unspecified: Secondary | ICD-10-CM | POA: Diagnosis not present

## 2020-09-30 NOTE — Progress Notes (Signed)
Maureen Vaughn, [place] [address]   CLINIC:  Medical Oncology/Hematology  PCP:  Caryl Bis, MD [address] [place] [address] [phone]   REASON FOR VISIT:  Follow-up for pancreatic cancer  PRIOR THERAPY: Whipple surgery on 06/21/2013  NGS Results: Not done  CURRENT THERAPY: Creon TID  BRIEF ONCOLOGIC HISTORY:  Oncology History  Adenocarcinoma of pancreas ([place])  05/23/2013 Tumor Marker   Patient's tumor was tested for the following markers: CA 19-9. Results of the tumor marker test revealed 86.   05/24/2013 Tumor Marker   Patient's tumor was tested for the following markers: CEA. Results of the tumor marker test revealed 5.   06/18/2013 Procedure   Malignant distal CBD stricture in distal 2-3cm of CBD- 10 x 20m covered metal stent placed      06/21/2013 Procedure   Diagnostic laparoscopy, open pylorus preserving pancreaticoduodenectomy, 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 Patient status post Whipple procedure without evidence of pancreatic carcinoma recurrence or metastasis. 2. No surgical complication identified. 3. Bilateral pars defects with grade 1 anterolisthesis at L5-S1.   06/21/2016 Imaging   Ct abd/pelvis- Stable postop changes from Whipple procedure. No evidence of recurrent or metastatic carcinoma within abdomen.  Incidentally noted aortic atherosclerosis and bilateral L5 pars defects, with degenerative disc disease and grade 2 anterolisthesis at L5-S1     CANCER STAGING: Cancer  Staging Adenocarcinoma of pancreas ([facility] Staging form: Pancreas, AJCC 7th Edition - Pathologic stage from 06/26/2013: Stage IIB (T3, N1, cM0) - Signed by KBaird Cancer PA-C on 12/23/2015   INTERVAL HISTORY:  Maureen Vaughn a 70y.o. female, returns for routine follow-up of her pancreatic cancer. [place] last seen by Dr. KDelton Coombeson 03/18/2020.  On exam today Mrs. Beidleman reports that she has been well in the interim since her last visit.  She notes that she has had a few mechanical falls which she thinks may be due to unsteadiness on her feet.  She continues to take Creon therapy 3 times a day and is not having much digestive upset.  She does periodically have an episode of diarrhea.  She notes that this "comes and goes" and is not related to any particular food or dietary behavior.  She does have some occasional stomach pains.  Otherwise she denies any fevers, chills, sweats, nausea, vomiting or diarrhea.  Her weight has been stable.  There are no signs or symptoms concerning for recurrence today.  A full 10 point ROS is listed below.   REVIEW OF SYSTEMS:  Review of Systems  Constitutional:  Positive for fatigue (75%). Negative for appetite change.  Gastrointestinal:  Positive for abdominal pain (occasional) and diarrhea (occasional).  All other systems reviewed and are negative.  PAST MEDICAL/SURGICAL HISTORY:  Past Medical History:  Diagnosis Date   [place]denocarcinoma of pancreas ([place] 12/23/2015   Cancer ([place]    pancreatic   Chronic abdominal pain    Chronic diarrhea    Depression    Falls frequently    Fracture of right humerus    Hip fracture, right (  [place])    Hypotension    Past Surgical History:  Procedure Laterality Date   GASTROSTOMY TUBE PLACEMENT      SOCIAL HISTORY:  Social History   Socioeconomic History   Marital status: Legally Separated    Spouse name: Not on file   Number of children: Not on file   Years of education: Not on file   Highest  education level: Not on file  Occupational History   Not on file  Tobacco Use   Smoking status: Former    Packs/day: 2.00    Years: 35.00    Pack years: 70.00    Types: Cigarettes    Quit date: 12/23/2010    Years since quitting: 9.7   Smokeless tobacco: Never  Substance and Sexual Activity   Alcohol use: No    Comment: H/O of EtOHism drinking 1/2-1 case of beer per week   Drug use: No   Sexual activity: Not on file  Other Topics Concern   Not on file  Social History Narrative   Not on file   Social Determinants of Health   Financial Resource Strain: Not on file  Food Insecurity: Not on file  Transportation Needs: Not on file  Physical Activity: Not on file  Stress: Not on file  Social Connections: Not on file  Intimate Partner Violence: Not on file    FAMILY HISTORY:  Family History  Adopted: Yes    CURRENT MEDICATIONS:  Current Outpatient Medications  Medication Sig Dispense Refill   atorvastatin (LIPITOR) 20 MG tablet Take 20 mg by mouth at bedtime.     Cranberry-Vitamin C-Probiotic (AZO CRANBERRY) 250-30 MG TABS Take 1 tablet by mouth daily.     CREON 36000-114000 units CPEP capsule TAKE ONE CAPSULE BY MOUTH THREE TIMES DAILY WITH MEALS 90 capsule 6   diazepam (VALIUM) 5 MG tablet Take 5 mg by mouth 2 (two) times daily.      DULoxetine (CYMBALTA) 60 MG capsule Take 60 mg by mouth daily.  11   FLUoxetine (PROZAC) 20 MG capsule Take 20 mg by mouth daily.     ibandronate (BONIVA) 150 MG tablet Take by mouth every 30 (thirty) days.      metoprolol succinate (TOPROL-XL) 25 MG 24 hr tablet Take 12.5 mg by mouth daily.      omeprazole (PRILOSEC) 40 MG capsule Take 40 mg by mouth daily.  5   ondansetron (ZOFRAN) 4 MG tablet Take 4 mg by mouth every 8 (eight) hours as needed.  5   potassium chloride SA (KLOR-CON) 20 MEQ tablet Take 20 mEq by mouth daily.     SYMBICORT 160-4.5 MCG/ACT inhaler Inhale 2 puffs into the lungs 2 (two) times daily.     traMADol (ULTRAM) 50 MG  tablet 50 mg every 6 (six) hours as needed.   2   No current facility-administered medications for this visit.    ALLERGIES:  No Known Allergies  PHYSICAL EXAM:  Performance status (ECOG): 1 - Symptomatic but completely ambulatory  Vitals:   09/30/20 1402  BP: (!) 112/59  Pulse: 61  Resp: 16  Temp: (!) 96.8 F (36 C)  SpO2: 97%   Wt Readings from Last 3 Encounters:  09/30/20 119 lb 11.2 oz (54.3 kg)  03/18/20 122 lb 8 oz (55.6 kg)  08/23/19 131 lb 3.2 oz (59.5 kg)   Physical Exam Vitals reviewed.  Constitutional:      Appearance: Normal appearance.  Cardiovascular:     Rate and Rhythm: Normal rate and  regular rhythm.     Pulses: Normal pulses.     Heart sounds: Normal heart sounds.  Pulmonary:     Effort: Pulmonary effort is normal.     Breath sounds: Normal breath sounds.  Abdominal:     Palpations: Abdomen is soft. There is no mass.     Tenderness: There is abdominal tenderness (soreness) in the right lower quadrant.     Hernia: No hernia is present.  Musculoskeletal:     Right lower leg: No edema.     Left lower leg: No edema.  Neurological:     General: No focal deficit present.     Mental Status: She is alert and oriented to person, place, and time.  Psychiatric:        Mood and Affect: Mood normal.        Behavior: Behavior normal.     LABORATORY DATA:  I have reviewed the labs as listed.  CBC Latest Ref Rng & Units 09/23/2020 03/14/2020 07/04/2019  WBC 4.0 - 10.5 K/uL 7.9 12.7(H) 8.9  Hemoglobin 12.0 - 15.0 g/dL 12.6 13.5 13.2  Hematocrit 36.0 - 46.0 % 40.1 43.5 42.9  Platelets 150 - 400 K/uL 274 332 317   CMP Latest Ref Rng & Units 09/23/2020 03/14/2020 07/04/2019  Glucose 70 - 99 mg/dL 123(H) 86 95  BUN 8 - 23 mg/dL '12 16 13  '$ Creatinine 0.44 - 1.00 mg/dL 1.02(H) 1.24(H) 0.97  Sodium 135 - 145 mmol/L 136 138 141  Potassium 3.5 - 5.1 mmol/L 4.6 4.7 4.4  Chloride 98 - 111 mmol/L 106 102 106  CO2 22 - 32 mmol/L '26 24 26  '$ Calcium 8.9 - 10.3 mg/dL 9.0  8.3(L) 9.0  Total Protein 6.5 - 8.1 g/dL 7.0 7.5 7.2  Total Bilirubin 0.3 - 1.2 mg/dL 0.8 0.9 1.0  Alkaline Phos 38 - 126 U/L 89 101 105  AST 15 - 41 U/L '29 28 25  '$ ALT 0 - 44 U/L '20 24 22   '$ Lab Results  Component Value Date   LDH 165 03/14/2020   LDH 151 04/10/2018   Lab Results  Component Value Date   [id number] 16 09/23/2020   [id number] 19 03/14/2020   CAN199 14 07/04/2019    DIAGNOSTIC IMAGING:  I have independently reviewed the scans and discussed with the patient. No results found.  ASSESSMENT:  1.  Stage IIb pancreatic adenocarcinoma: -Status post Whipple surgery on 06/21/2013 at [place].  She did not receive any adjuvant chemotherapy due to complicated postoperative course. -PET scan on 04/10/2018 showed no abnormal high signal intensity collection along the lateral margin of the left kidney coinciding with her hematoma.  Small focus of attenuated activity in the lateral segment of the left hepatic lobe near the falciform ligament mildly above the background hepatic activity.  Persistent mild asymmetric activity in the palatine tonsillar region left more than right.  She was seen by Dr. Benjamine Mola for the tonsillar lesion. -PET scan on 08/14/2018 did not show any evidence of metastatic disease in the neck, chest, abdomen or pelvis.  Small focus of low level increased metabolic activity in the left lobe of the liver, stable to decrease compared to prior exam.  Near complete resolution of previously noted left-sided perinephrotic fluid collection. -CT of the abdomen and pelvis on 12/21/2018 which showed partial pancreatectomy without evidence of local recurrence or metastatic disease.  No enhancing hepatic lesions along the falciform ligament.  No evidence of liver metastasis. -Labs on 07/04/2019 showed LFTs and CA 19  9 were all WNL. -MRI of the abdomen on 08/10/2019 showed no metastatic disease. Mild pancreatic duct dilation mildly increased compared to previous imaging approximately 2 mm. Also  showed signs of prior hemorrhage or trauma to the left kidney based on the appearance with non-mass-like signal along the margin of the left kidney. Small renal lesion in the lower pole of the left kidney is too small for definitive characterization due to presence of motion on the current scan this measures 8 mm. -CT AP on 03/14/2020 with no evidence of recurrence.   2.  Normocytic anemia: -Last Feraheme was on 07/13/2017 and 07/20/2017.   3.  Diarrhea: -She is taking Creon 36,000 units 3 times a day with meals.  PLAN:  1.  Stage IIb pancreatic adenocarcinoma: -She does not have any clinical signs or symptoms of recurrence. - CT scan results from 03/14/2020 which did not show any evidence of recurrence. -Labs from last week showed normal CA 19-9. -RTC 12 months for follow-up with repeat tumor marker and labs.  Will do CT scan only if clinical condition dictates.  2.  Normocytic anemia: -Hemoglobin was 12.6.  No indication for parenteral iron therapy.  3.  Diarrhea: -Continue Creon 3 times daily with meals.  Orders placed this encounter:  No orders of the defined types were placed in this encounter.  Ledell Peoples, MD Department of Hematology/Oncology [place] [facility] Phone: [phone] Pager: [phone] Email: [email or web address]

## 2020-10-06 DIAGNOSIS — R262 Difficulty in walking, not elsewhere classified: Secondary | ICD-10-CM | POA: Diagnosis not present

## 2020-10-06 DIAGNOSIS — Z96641 Presence of right artificial hip joint: Secondary | ICD-10-CM | POA: Diagnosis not present

## 2020-10-06 DIAGNOSIS — Z9181 History of falling: Secondary | ICD-10-CM | POA: Diagnosis not present

## 2020-10-06 DIAGNOSIS — M5136 Other intervertebral disc degeneration, lumbar region: Secondary | ICD-10-CM | POA: Diagnosis not present

## 2020-10-06 DIAGNOSIS — Z8673 Personal history of transient ischemic attack (TIA), and cerebral infarction without residual deficits: Secondary | ICD-10-CM | POA: Diagnosis not present

## 2020-10-06 DIAGNOSIS — Z87891 Personal history of nicotine dependence: Secondary | ICD-10-CM | POA: Diagnosis not present

## 2020-10-06 DIAGNOSIS — I10 Essential (primary) hypertension: Secondary | ICD-10-CM | POA: Diagnosis not present

## 2020-10-06 DIAGNOSIS — M810 Age-related osteoporosis without current pathological fracture: Secondary | ICD-10-CM | POA: Diagnosis not present

## 2020-10-06 DIAGNOSIS — I700 Atherosclerosis of aorta: Secondary | ICD-10-CM | POA: Diagnosis not present

## 2020-10-06 DIAGNOSIS — R404 Transient alteration of awareness: Secondary | ICD-10-CM | POA: Diagnosis not present

## 2020-10-06 DIAGNOSIS — E782 Mixed hyperlipidemia: Secondary | ICD-10-CM | POA: Diagnosis not present

## 2020-10-06 DIAGNOSIS — M4317 Spondylolisthesis, lumbosacral region: Secondary | ICD-10-CM | POA: Diagnosis not present

## 2020-10-06 DIAGNOSIS — R2689 Other abnormalities of gait and mobility: Secondary | ICD-10-CM | POA: Diagnosis not present

## 2020-10-06 DIAGNOSIS — R531 Weakness: Secondary | ICD-10-CM | POA: Diagnosis not present

## 2020-10-06 DIAGNOSIS — Z9049 Acquired absence of other specified parts of digestive tract: Secondary | ICD-10-CM | POA: Diagnosis not present

## 2020-10-06 DIAGNOSIS — R059 Cough, unspecified: Secondary | ICD-10-CM | POA: Diagnosis not present

## 2020-10-06 DIAGNOSIS — K5730 Diverticulosis of large intestine without perforation or abscess without bleeding: Secondary | ICD-10-CM | POA: Diagnosis not present

## 2020-10-06 DIAGNOSIS — R109 Unspecified abdominal pain: Secondary | ICD-10-CM | POA: Diagnosis not present

## 2020-10-06 DIAGNOSIS — Z87440 Personal history of urinary (tract) infections: Secondary | ICD-10-CM | POA: Diagnosis not present

## 2020-10-06 DIAGNOSIS — R1084 Generalized abdominal pain: Secondary | ICD-10-CM | POA: Diagnosis not present

## 2020-10-06 DIAGNOSIS — K8689 Other specified diseases of pancreas: Secondary | ICD-10-CM | POA: Diagnosis not present

## 2020-10-06 DIAGNOSIS — Z20822 Contact with and (suspected) exposure to covid-19: Secondary | ICD-10-CM | POA: Diagnosis not present

## 2020-10-06 DIAGNOSIS — Z8507 Personal history of malignant neoplasm of pancreas: Secondary | ICD-10-CM | POA: Diagnosis not present

## 2020-10-06 DIAGNOSIS — Z7409 Other reduced mobility: Secondary | ICD-10-CM | POA: Diagnosis not present

## 2020-10-06 DIAGNOSIS — R197 Diarrhea, unspecified: Secondary | ICD-10-CM | POA: Diagnosis not present

## 2020-10-06 DIAGNOSIS — K5790 Diverticulosis of intestine, part unspecified, without perforation or abscess without bleeding: Secondary | ICD-10-CM | POA: Diagnosis not present

## 2020-10-06 DIAGNOSIS — R1030 Lower abdominal pain, unspecified: Secondary | ICD-10-CM | POA: Diagnosis not present

## 2020-10-08 DIAGNOSIS — Z87891 Personal history of nicotine dependence: Secondary | ICD-10-CM | POA: Diagnosis not present

## 2020-10-08 DIAGNOSIS — I129 Hypertensive chronic kidney disease with stage 1 through stage 4 chronic kidney disease, or unspecified chronic kidney disease: Secondary | ICD-10-CM | POA: Diagnosis not present

## 2020-10-08 DIAGNOSIS — J449 Chronic obstructive pulmonary disease, unspecified: Secondary | ICD-10-CM | POA: Diagnosis not present

## 2020-10-08 DIAGNOSIS — N183 Chronic kidney disease, stage 3 unspecified: Secondary | ICD-10-CM | POA: Diagnosis not present

## 2020-10-08 DIAGNOSIS — E7849 Other hyperlipidemia: Secondary | ICD-10-CM | POA: Diagnosis not present

## 2020-10-24 ENCOUNTER — Other Ambulatory Visit (HOSPITAL_COMMUNITY): Payer: Self-pay

## 2020-10-31 DIAGNOSIS — E559 Vitamin D deficiency, unspecified: Secondary | ICD-10-CM | POA: Diagnosis not present

## 2020-10-31 DIAGNOSIS — N183 Chronic kidney disease, stage 3 unspecified: Secondary | ICD-10-CM | POA: Diagnosis not present

## 2020-10-31 DIAGNOSIS — E782 Mixed hyperlipidemia: Secondary | ICD-10-CM | POA: Diagnosis not present

## 2020-10-31 DIAGNOSIS — E876 Hypokalemia: Secondary | ICD-10-CM | POA: Diagnosis not present

## 2020-10-31 DIAGNOSIS — K219 Gastro-esophageal reflux disease without esophagitis: Secondary | ICD-10-CM | POA: Diagnosis not present

## 2020-10-31 DIAGNOSIS — I10 Essential (primary) hypertension: Secondary | ICD-10-CM | POA: Diagnosis not present

## 2020-10-31 DIAGNOSIS — R748 Abnormal levels of other serum enzymes: Secondary | ICD-10-CM | POA: Diagnosis not present

## 2020-10-31 DIAGNOSIS — E7849 Other hyperlipidemia: Secondary | ICD-10-CM | POA: Diagnosis not present

## 2020-10-31 DIAGNOSIS — E871 Hypo-osmolality and hyponatremia: Secondary | ICD-10-CM | POA: Diagnosis not present

## 2020-11-03 DIAGNOSIS — Z23 Encounter for immunization: Secondary | ICD-10-CM | POA: Diagnosis not present

## 2020-11-03 DIAGNOSIS — E876 Hypokalemia: Secondary | ICD-10-CM | POA: Diagnosis not present

## 2020-11-03 DIAGNOSIS — R4582 Worries: Secondary | ICD-10-CM | POA: Diagnosis not present

## 2020-11-03 DIAGNOSIS — I82891 Chronic embolism and thrombosis of other specified veins: Secondary | ICD-10-CM | POA: Diagnosis not present

## 2020-11-03 DIAGNOSIS — I700 Atherosclerosis of aorta: Secondary | ICD-10-CM | POA: Diagnosis not present

## 2020-11-03 DIAGNOSIS — I10 Essential (primary) hypertension: Secondary | ICD-10-CM | POA: Diagnosis not present

## 2020-11-03 DIAGNOSIS — K8681 Exocrine pancreatic insufficiency: Secondary | ICD-10-CM | POA: Diagnosis not present

## 2020-11-03 DIAGNOSIS — Z0001 Encounter for general adult medical examination with abnormal findings: Secondary | ICD-10-CM | POA: Diagnosis not present

## 2021-02-27 DIAGNOSIS — Z1329 Encounter for screening for other suspected endocrine disorder: Secondary | ICD-10-CM | POA: Diagnosis not present

## 2021-02-27 DIAGNOSIS — N183 Chronic kidney disease, stage 3 unspecified: Secondary | ICD-10-CM | POA: Diagnosis not present

## 2021-02-27 DIAGNOSIS — E782 Mixed hyperlipidemia: Secondary | ICD-10-CM | POA: Diagnosis not present

## 2021-02-27 DIAGNOSIS — E876 Hypokalemia: Secondary | ICD-10-CM | POA: Diagnosis not present

## 2021-02-27 DIAGNOSIS — K219 Gastro-esophageal reflux disease without esophagitis: Secondary | ICD-10-CM | POA: Diagnosis not present

## 2021-02-27 DIAGNOSIS — E7849 Other hyperlipidemia: Secondary | ICD-10-CM | POA: Diagnosis not present

## 2021-02-27 DIAGNOSIS — I10 Essential (primary) hypertension: Secondary | ICD-10-CM | POA: Diagnosis not present

## 2021-03-04 DIAGNOSIS — E876 Hypokalemia: Secondary | ICD-10-CM | POA: Diagnosis not present

## 2021-03-04 DIAGNOSIS — K8681 Exocrine pancreatic insufficiency: Secondary | ICD-10-CM | POA: Diagnosis not present

## 2021-03-04 DIAGNOSIS — R4582 Worries: Secondary | ICD-10-CM | POA: Diagnosis not present

## 2021-03-04 DIAGNOSIS — E7849 Other hyperlipidemia: Secondary | ICD-10-CM | POA: Diagnosis not present

## 2021-03-04 DIAGNOSIS — I10 Essential (primary) hypertension: Secondary | ICD-10-CM | POA: Diagnosis not present

## 2021-03-04 DIAGNOSIS — I82891 Chronic embolism and thrombosis of other specified veins: Secondary | ICD-10-CM | POA: Diagnosis not present

## 2021-03-04 DIAGNOSIS — I700 Atherosclerosis of aorta: Secondary | ICD-10-CM | POA: Diagnosis not present

## 2021-03-25 ENCOUNTER — Other Ambulatory Visit (HOSPITAL_COMMUNITY): Payer: Self-pay | Admitting: *Deleted

## 2021-03-25 DIAGNOSIS — C259 Malignant neoplasm of pancreas, unspecified: Secondary | ICD-10-CM

## 2021-03-25 MED ORDER — PANCRELIPASE (LIP-PROT-AMYL) 36000-114000 UNITS PO CPEP: 36000.0000 [IU] | ORAL_CAPSULE | Freq: Three times a day (TID) | ORAL | 6 refills | Status: DC

## 2021-06-04 DIAGNOSIS — K219 Gastro-esophageal reflux disease without esophagitis: Secondary | ICD-10-CM | POA: Diagnosis not present

## 2021-06-04 DIAGNOSIS — N183 Chronic kidney disease, stage 3 unspecified: Secondary | ICD-10-CM | POA: Diagnosis not present

## 2021-06-04 DIAGNOSIS — R748 Abnormal levels of other serum enzymes: Secondary | ICD-10-CM | POA: Diagnosis not present

## 2021-06-04 DIAGNOSIS — E7849 Other hyperlipidemia: Secondary | ICD-10-CM | POA: Diagnosis not present

## 2021-06-04 DIAGNOSIS — I82891 Chronic embolism and thrombosis of other specified veins: Secondary | ICD-10-CM | POA: Diagnosis not present

## 2021-06-04 DIAGNOSIS — I10 Essential (primary) hypertension: Secondary | ICD-10-CM | POA: Diagnosis not present

## 2021-06-04 DIAGNOSIS — E782 Mixed hyperlipidemia: Secondary | ICD-10-CM | POA: Diagnosis not present

## 2021-06-04 DIAGNOSIS — D631 Anemia in chronic kidney disease: Secondary | ICD-10-CM | POA: Diagnosis not present

## 2021-06-08 DIAGNOSIS — E7849 Other hyperlipidemia: Secondary | ICD-10-CM | POA: Diagnosis not present

## 2021-06-08 DIAGNOSIS — E538 Deficiency of other specified B group vitamins: Secondary | ICD-10-CM | POA: Diagnosis not present

## 2021-06-08 DIAGNOSIS — K8681 Exocrine pancreatic insufficiency: Secondary | ICD-10-CM | POA: Diagnosis not present

## 2021-06-08 DIAGNOSIS — M1612 Unilateral primary osteoarthritis, left hip: Secondary | ICD-10-CM | POA: Diagnosis not present

## 2021-06-08 DIAGNOSIS — I10 Essential (primary) hypertension: Secondary | ICD-10-CM | POA: Diagnosis not present

## 2021-06-08 DIAGNOSIS — E876 Hypokalemia: Secondary | ICD-10-CM | POA: Diagnosis not present

## 2021-06-08 DIAGNOSIS — I82891 Chronic embolism and thrombosis of other specified veins: Secondary | ICD-10-CM | POA: Diagnosis not present

## 2021-06-08 DIAGNOSIS — I700 Atherosclerosis of aorta: Secondary | ICD-10-CM | POA: Diagnosis not present

## 2021-06-08 DIAGNOSIS — R4582 Worries: Secondary | ICD-10-CM | POA: Diagnosis not present

## 2021-06-26 DIAGNOSIS — Z8673 Personal history of transient ischemic attack (TIA), and cerebral infarction without residual deficits: Secondary | ICD-10-CM | POA: Diagnosis not present

## 2021-06-26 DIAGNOSIS — R413 Other amnesia: Secondary | ICD-10-CM | POA: Diagnosis not present

## 2021-09-07 DIAGNOSIS — I10 Essential (primary) hypertension: Secondary | ICD-10-CM | POA: Diagnosis not present

## 2021-09-07 DIAGNOSIS — Z1329 Encounter for screening for other suspected endocrine disorder: Secondary | ICD-10-CM | POA: Diagnosis not present

## 2021-09-07 DIAGNOSIS — Z131 Encounter for screening for diabetes mellitus: Secondary | ICD-10-CM | POA: Diagnosis not present

## 2021-09-07 DIAGNOSIS — R748 Abnormal levels of other serum enzymes: Secondary | ICD-10-CM | POA: Diagnosis not present

## 2021-09-07 DIAGNOSIS — N183 Chronic kidney disease, stage 3 unspecified: Secondary | ICD-10-CM | POA: Diagnosis not present

## 2021-09-07 DIAGNOSIS — E7849 Other hyperlipidemia: Secondary | ICD-10-CM | POA: Diagnosis not present

## 2021-09-07 DIAGNOSIS — E559 Vitamin D deficiency, unspecified: Secondary | ICD-10-CM | POA: Diagnosis not present

## 2021-09-09 DIAGNOSIS — R4582 Worries: Secondary | ICD-10-CM | POA: Diagnosis not present

## 2021-09-09 DIAGNOSIS — E876 Hypokalemia: Secondary | ICD-10-CM | POA: Diagnosis not present

## 2021-09-09 DIAGNOSIS — E7849 Other hyperlipidemia: Secondary | ICD-10-CM | POA: Diagnosis not present

## 2021-09-09 DIAGNOSIS — Z0001 Encounter for general adult medical examination with abnormal findings: Secondary | ICD-10-CM | POA: Diagnosis not present

## 2021-09-09 DIAGNOSIS — I10 Essential (primary) hypertension: Secondary | ICD-10-CM | POA: Diagnosis not present

## 2021-09-09 DIAGNOSIS — I82891 Chronic embolism and thrombosis of other specified veins: Secondary | ICD-10-CM | POA: Diagnosis not present

## 2021-09-09 DIAGNOSIS — Z23 Encounter for immunization: Secondary | ICD-10-CM | POA: Diagnosis not present

## 2021-09-09 DIAGNOSIS — E538 Deficiency of other specified B group vitamins: Secondary | ICD-10-CM | POA: Diagnosis not present

## 2021-09-09 DIAGNOSIS — K8681 Exocrine pancreatic insufficiency: Secondary | ICD-10-CM | POA: Diagnosis not present

## 2021-09-09 DIAGNOSIS — I700 Atherosclerosis of aorta: Secondary | ICD-10-CM | POA: Diagnosis not present

## 2021-09-09 DIAGNOSIS — R109 Unspecified abdominal pain: Secondary | ICD-10-CM | POA: Diagnosis not present

## 2021-09-12 DIAGNOSIS — I10 Essential (primary) hypertension: Secondary | ICD-10-CM | POA: Diagnosis not present

## 2021-09-12 DIAGNOSIS — K8689 Other specified diseases of pancreas: Secondary | ICD-10-CM | POA: Diagnosis not present

## 2021-09-12 DIAGNOSIS — M810 Age-related osteoporosis without current pathological fracture: Secondary | ICD-10-CM | POA: Diagnosis not present

## 2021-09-12 DIAGNOSIS — N390 Urinary tract infection, site not specified: Secondary | ICD-10-CM | POA: Diagnosis not present

## 2021-09-12 DIAGNOSIS — E782 Mixed hyperlipidemia: Secondary | ICD-10-CM | POA: Diagnosis not present

## 2021-09-12 DIAGNOSIS — I959 Hypotension, unspecified: Secondary | ICD-10-CM | POA: Diagnosis not present

## 2021-09-12 DIAGNOSIS — Z79899 Other long term (current) drug therapy: Secondary | ICD-10-CM | POA: Diagnosis not present

## 2021-09-12 DIAGNOSIS — Z96641 Presence of right artificial hip joint: Secondary | ICD-10-CM | POA: Diagnosis not present

## 2021-09-12 DIAGNOSIS — Z87891 Personal history of nicotine dependence: Secondary | ICD-10-CM | POA: Diagnosis not present

## 2021-09-12 DIAGNOSIS — Z9049 Acquired absence of other specified parts of digestive tract: Secondary | ICD-10-CM | POA: Diagnosis not present

## 2021-09-12 DIAGNOSIS — R410 Disorientation, unspecified: Secondary | ICD-10-CM | POA: Diagnosis not present

## 2021-09-13 DIAGNOSIS — R404 Transient alteration of awareness: Secondary | ICD-10-CM | POA: Diagnosis not present

## 2021-09-13 DIAGNOSIS — N3000 Acute cystitis without hematuria: Secondary | ICD-10-CM | POA: Diagnosis not present

## 2021-09-17 DIAGNOSIS — R4582 Worries: Secondary | ICD-10-CM | POA: Diagnosis not present

## 2021-09-23 DIAGNOSIS — R109 Unspecified abdominal pain: Secondary | ICD-10-CM | POA: Diagnosis not present

## 2021-09-23 DIAGNOSIS — K8681 Exocrine pancreatic insufficiency: Secondary | ICD-10-CM | POA: Diagnosis not present

## 2021-09-23 DIAGNOSIS — N183 Chronic kidney disease, stage 3 unspecified: Secondary | ICD-10-CM | POA: Diagnosis not present

## 2021-09-29 ENCOUNTER — Other Ambulatory Visit: Payer: Self-pay

## 2021-09-29 DIAGNOSIS — C259 Malignant neoplasm of pancreas, unspecified: Secondary | ICD-10-CM

## 2021-09-30 ENCOUNTER — Inpatient Hospital Stay: Payer: Medicare Other | Attending: Hematology

## 2021-10-06 ENCOUNTER — Other Ambulatory Visit (HOSPITAL_COMMUNITY): Payer: Self-pay | Admitting: Hematology

## 2021-10-06 DIAGNOSIS — C259 Malignant neoplasm of pancreas, unspecified: Secondary | ICD-10-CM

## 2021-10-07 ENCOUNTER — Ambulatory Visit: Payer: Medicare Other | Admitting: Hematology

## 2021-10-14 DIAGNOSIS — N3090 Cystitis, unspecified without hematuria: Secondary | ICD-10-CM | POA: Diagnosis not present

## 2021-10-14 DIAGNOSIS — Z8673 Personal history of transient ischemic attack (TIA), and cerebral infarction without residual deficits: Secondary | ICD-10-CM | POA: Diagnosis not present

## 2021-10-14 DIAGNOSIS — R109 Unspecified abdominal pain: Secondary | ICD-10-CM | POA: Diagnosis not present

## 2021-12-07 DIAGNOSIS — N390 Urinary tract infection, site not specified: Secondary | ICD-10-CM | POA: Diagnosis not present

## 2021-12-07 DIAGNOSIS — R9431 Abnormal electrocardiogram [ECG] [EKG]: Secondary | ICD-10-CM | POA: Diagnosis not present

## 2021-12-07 DIAGNOSIS — Z87891 Personal history of nicotine dependence: Secondary | ICD-10-CM | POA: Diagnosis not present

## 2021-12-07 DIAGNOSIS — R079 Chest pain, unspecified: Secondary | ICD-10-CM | POA: Diagnosis not present

## 2021-12-07 DIAGNOSIS — I10 Essential (primary) hypertension: Secondary | ICD-10-CM | POA: Diagnosis not present

## 2021-12-07 DIAGNOSIS — Z20822 Contact with and (suspected) exposure to covid-19: Secondary | ICD-10-CM | POA: Diagnosis not present

## 2021-12-07 DIAGNOSIS — E782 Mixed hyperlipidemia: Secondary | ICD-10-CM | POA: Diagnosis not present

## 2021-12-07 DIAGNOSIS — Z8507 Personal history of malignant neoplasm of pancreas: Secondary | ICD-10-CM | POA: Diagnosis not present

## 2021-12-07 DIAGNOSIS — G319 Degenerative disease of nervous system, unspecified: Secondary | ICD-10-CM | POA: Diagnosis not present

## 2021-12-07 DIAGNOSIS — Z8673 Personal history of transient ischemic attack (TIA), and cerebral infarction without residual deficits: Secondary | ICD-10-CM | POA: Diagnosis not present

## 2021-12-07 DIAGNOSIS — I700 Atherosclerosis of aorta: Secondary | ICD-10-CM | POA: Diagnosis not present

## 2022-01-05 DIAGNOSIS — K219 Gastro-esophageal reflux disease without esophagitis: Secondary | ICD-10-CM | POA: Diagnosis not present

## 2022-01-05 DIAGNOSIS — Z131 Encounter for screening for diabetes mellitus: Secondary | ICD-10-CM | POA: Diagnosis not present

## 2022-01-05 DIAGNOSIS — D631 Anemia in chronic kidney disease: Secondary | ICD-10-CM | POA: Diagnosis not present

## 2022-01-05 DIAGNOSIS — E876 Hypokalemia: Secondary | ICD-10-CM | POA: Diagnosis not present

## 2022-01-05 DIAGNOSIS — N183 Chronic kidney disease, stage 3 unspecified: Secondary | ICD-10-CM | POA: Diagnosis not present

## 2022-01-05 DIAGNOSIS — E7849 Other hyperlipidemia: Secondary | ICD-10-CM | POA: Diagnosis not present

## 2022-01-05 DIAGNOSIS — R748 Abnormal levels of other serum enzymes: Secondary | ICD-10-CM | POA: Diagnosis not present

## 2022-01-05 DIAGNOSIS — E871 Hypo-osmolality and hyponatremia: Secondary | ICD-10-CM | POA: Diagnosis not present

## 2022-01-11 DIAGNOSIS — R4582 Worries: Secondary | ICD-10-CM | POA: Diagnosis not present

## 2022-01-11 DIAGNOSIS — E876 Hypokalemia: Secondary | ICD-10-CM | POA: Diagnosis not present

## 2022-01-11 DIAGNOSIS — Z23 Encounter for immunization: Secondary | ICD-10-CM | POA: Diagnosis not present

## 2022-01-11 DIAGNOSIS — I10 Essential (primary) hypertension: Secondary | ICD-10-CM | POA: Diagnosis not present

## 2022-01-11 DIAGNOSIS — E538 Deficiency of other specified B group vitamins: Secondary | ICD-10-CM | POA: Diagnosis not present

## 2022-01-11 DIAGNOSIS — I82891 Chronic embolism and thrombosis of other specified veins: Secondary | ICD-10-CM | POA: Diagnosis not present

## 2022-01-11 DIAGNOSIS — E7849 Other hyperlipidemia: Secondary | ICD-10-CM | POA: Diagnosis not present

## 2022-01-11 DIAGNOSIS — K8681 Exocrine pancreatic insufficiency: Secondary | ICD-10-CM | POA: Diagnosis not present

## 2022-01-11 DIAGNOSIS — R109 Unspecified abdominal pain: Secondary | ICD-10-CM | POA: Diagnosis not present

## 2022-01-11 DIAGNOSIS — M1612 Unilateral primary osteoarthritis, left hip: Secondary | ICD-10-CM | POA: Diagnosis not present

## 2022-01-11 DIAGNOSIS — I700 Atherosclerosis of aorta: Secondary | ICD-10-CM | POA: Diagnosis not present

## 2022-01-11 DIAGNOSIS — Z0283 Encounter for blood-alcohol and blood-drug test: Secondary | ICD-10-CM | POA: Diagnosis not present

## 2022-04-07 DIAGNOSIS — J449 Chronic obstructive pulmonary disease, unspecified: Secondary | ICD-10-CM | POA: Diagnosis not present

## 2022-04-07 DIAGNOSIS — K2100 Gastro-esophageal reflux disease with esophagitis, without bleeding: Secondary | ICD-10-CM | POA: Diagnosis not present

## 2022-04-07 DIAGNOSIS — R748 Abnormal levels of other serum enzymes: Secondary | ICD-10-CM | POA: Diagnosis not present

## 2022-04-07 DIAGNOSIS — E7849 Other hyperlipidemia: Secondary | ICD-10-CM | POA: Diagnosis not present

## 2022-04-07 DIAGNOSIS — N183 Chronic kidney disease, stage 3 unspecified: Secondary | ICD-10-CM | POA: Diagnosis not present

## 2022-04-07 DIAGNOSIS — R7303 Prediabetes: Secondary | ICD-10-CM | POA: Diagnosis not present

## 2022-04-14 DIAGNOSIS — E538 Deficiency of other specified B group vitamins: Secondary | ICD-10-CM | POA: Diagnosis not present

## 2022-04-14 DIAGNOSIS — K8681 Exocrine pancreatic insufficiency: Secondary | ICD-10-CM | POA: Diagnosis not present

## 2022-04-14 DIAGNOSIS — K2100 Gastro-esophageal reflux disease with esophagitis, without bleeding: Secondary | ICD-10-CM | POA: Diagnosis not present

## 2022-04-14 DIAGNOSIS — E876 Hypokalemia: Secondary | ICD-10-CM | POA: Diagnosis not present

## 2022-04-14 DIAGNOSIS — Z23 Encounter for immunization: Secondary | ICD-10-CM | POA: Diagnosis not present

## 2022-04-14 DIAGNOSIS — E7849 Other hyperlipidemia: Secondary | ICD-10-CM | POA: Diagnosis not present

## 2022-04-14 DIAGNOSIS — I10 Essential (primary) hypertension: Secondary | ICD-10-CM | POA: Diagnosis not present

## 2022-04-14 DIAGNOSIS — I700 Atherosclerosis of aorta: Secondary | ICD-10-CM | POA: Diagnosis not present

## 2022-04-14 DIAGNOSIS — I82891 Chronic embolism and thrombosis of other specified veins: Secondary | ICD-10-CM | POA: Diagnosis not present

## 2022-04-14 DIAGNOSIS — R4582 Worries: Secondary | ICD-10-CM | POA: Diagnosis not present

## 2022-07-20 DIAGNOSIS — E7849 Other hyperlipidemia: Secondary | ICD-10-CM | POA: Diagnosis not present

## 2022-07-20 DIAGNOSIS — E039 Hypothyroidism, unspecified: Secondary | ICD-10-CM | POA: Diagnosis not present

## 2022-07-20 DIAGNOSIS — Z131 Encounter for screening for diabetes mellitus: Secondary | ICD-10-CM | POA: Diagnosis not present

## 2022-07-20 DIAGNOSIS — K2100 Gastro-esophageal reflux disease with esophagitis, without bleeding: Secondary | ICD-10-CM | POA: Diagnosis not present

## 2022-07-20 DIAGNOSIS — N183 Chronic kidney disease, stage 3 unspecified: Secondary | ICD-10-CM | POA: Diagnosis not present

## 2022-07-20 DIAGNOSIS — J449 Chronic obstructive pulmonary disease, unspecified: Secondary | ICD-10-CM | POA: Diagnosis not present

## 2022-07-25 ENCOUNTER — Emergency Department (HOSPITAL_COMMUNITY): Payer: 59

## 2022-07-25 ENCOUNTER — Observation Stay (HOSPITAL_COMMUNITY)
Admission: EM | Admit: 2022-07-25 | Discharge: 2022-07-27 | Disposition: A | Discharge status: Home Health | Payer: 59 | Attending: Family Medicine | Admitting: Family Medicine

## 2022-07-25 ENCOUNTER — Other Ambulatory Visit: Payer: Self-pay

## 2022-07-25 DIAGNOSIS — R7989 Other specified abnormal findings of blood chemistry: Secondary | ICD-10-CM | POA: Diagnosis not present

## 2022-07-25 DIAGNOSIS — R2681 Unsteadiness on feet: Secondary | ICD-10-CM | POA: Insufficient documentation

## 2022-07-25 DIAGNOSIS — M6281 Muscle weakness (generalized): Secondary | ICD-10-CM | POA: Insufficient documentation

## 2022-07-25 DIAGNOSIS — K8689 Other specified diseases of pancreas: Secondary | ICD-10-CM | POA: Diagnosis not present

## 2022-07-25 DIAGNOSIS — R296 Repeated falls: Secondary | ICD-10-CM | POA: Diagnosis not present

## 2022-07-25 DIAGNOSIS — R2689 Other abnormalities of gait and mobility: Secondary | ICD-10-CM | POA: Insufficient documentation

## 2022-07-25 DIAGNOSIS — G9341 Metabolic encephalopathy: Secondary | ICD-10-CM | POA: Diagnosis not present

## 2022-07-25 DIAGNOSIS — E782 Mixed hyperlipidemia: Secondary | ICD-10-CM | POA: Diagnosis not present

## 2022-07-25 DIAGNOSIS — T887XXA Unspecified adverse effect of drug or medicament, initial encounter: Secondary | ICD-10-CM | POA: Diagnosis not present

## 2022-07-25 DIAGNOSIS — N179 Acute kidney failure, unspecified: Secondary | ICD-10-CM | POA: Diagnosis not present

## 2022-07-25 DIAGNOSIS — N3001 Acute cystitis with hematuria: Secondary | ICD-10-CM | POA: Diagnosis not present

## 2022-07-25 DIAGNOSIS — C259 Malignant neoplasm of pancreas, unspecified: Secondary | ICD-10-CM

## 2022-07-25 DIAGNOSIS — R748 Abnormal levels of other serum enzymes: Secondary | ICD-10-CM | POA: Diagnosis not present

## 2022-07-25 DIAGNOSIS — Z8507 Personal history of malignant neoplasm of pancreas: Secondary | ICD-10-CM | POA: Diagnosis not present

## 2022-07-25 DIAGNOSIS — Z79899 Other long term (current) drug therapy: Secondary | ICD-10-CM | POA: Diagnosis not present

## 2022-07-25 DIAGNOSIS — D72829 Elevated white blood cell count, unspecified: Secondary | ICD-10-CM | POA: Diagnosis not present

## 2022-07-25 DIAGNOSIS — K219 Gastro-esophageal reflux disease without esophagitis: Secondary | ICD-10-CM | POA: Diagnosis not present

## 2022-07-25 DIAGNOSIS — R4182 Altered mental status, unspecified: Secondary | ICD-10-CM | POA: Diagnosis present

## 2022-07-25 DIAGNOSIS — E162 Hypoglycemia, unspecified: Secondary | ICD-10-CM | POA: Insufficient documentation

## 2022-07-25 DIAGNOSIS — R410 Disorientation, unspecified: Secondary | ICD-10-CM | POA: Diagnosis not present

## 2022-07-25 DIAGNOSIS — R778 Other specified abnormalities of plasma proteins: Secondary | ICD-10-CM | POA: Diagnosis not present

## 2022-07-25 DIAGNOSIS — T50911A Poisoning by multiple unspecified drugs, medicaments and biological substances, accidental (unintentional), initial encounter: Secondary | ICD-10-CM | POA: Diagnosis not present

## 2022-07-25 DIAGNOSIS — Z9181 History of falling: Secondary | ICD-10-CM | POA: Insufficient documentation

## 2022-07-25 DIAGNOSIS — T50904A Poisoning by unspecified drugs, medicaments and biological substances, undetermined, initial encounter: Secondary | ICD-10-CM | POA: Diagnosis not present

## 2022-07-25 DIAGNOSIS — F1990 Other psychoactive substance use, unspecified, uncomplicated: Secondary | ICD-10-CM

## 2022-07-25 DIAGNOSIS — R0602 Shortness of breath: Secondary | ICD-10-CM | POA: Diagnosis not present

## 2022-07-25 DIAGNOSIS — Z87891 Personal history of nicotine dependence: Secondary | ICD-10-CM | POA: Insufficient documentation

## 2022-07-25 DIAGNOSIS — Z743 Need for continuous supervision: Secondary | ICD-10-CM | POA: Diagnosis not present

## 2022-07-25 DIAGNOSIS — I10 Essential (primary) hypertension: Secondary | ICD-10-CM | POA: Diagnosis not present

## 2022-07-25 LAB — URINALYSIS, W/ REFLEX TO CULTURE (INFECTION SUSPECTED)
Bilirubin Urine: NEGATIVE
Glucose, UA: NEGATIVE mg/dL
Ketones, ur: NEGATIVE mg/dL
Nitrite: NEGATIVE
Protein, ur: NEGATIVE mg/dL
Specific Gravity, Urine: 1.016 (ref 1.005–1.030)
pH: 5 (ref 5.0–8.0)

## 2022-07-25 LAB — TROPONIN I (HIGH SENSITIVITY)
Troponin I (High Sensitivity): 176 ng/L (ref <18)
Troponin I (High Sensitivity): 258 ng/L (ref <18)
Troponin I (High Sensitivity): 265 ng/L (ref <18)
Troponin I (High Sensitivity): 271 ng/L (ref <18)

## 2022-07-25 LAB — COMPREHENSIVE METABOLIC PANEL
ALT: 27 U/L (ref 0–44)
ALT: 26 U/L (ref 0–44)
AST: 34 U/L (ref 15–41)
AST: 37 U/L (ref 15–41)
Albumin: 4.3 g/dL (ref 3.5–5.0)
Albumin: 3.7 g/dL (ref 3.5–5.0)
Alkaline Phosphatase: 79 U/L (ref 38–126)
Alkaline Phosphatase: 67 U/L (ref 38–126)
Anion gap: 14 (ref 5–15)
Anion gap: 10 (ref 5–15)
BUN: 29 mg/dL — ABNORMAL HIGH (ref 8–23)
BUN: 27 mg/dL — ABNORMAL HIGH (ref 8–23)
CO2: 22 mmol/L (ref 22–32)
CO2: 22 mmol/L (ref 22–32)
Calcium: 9.7 mg/dL (ref 8.9–10.3)
Calcium: 9 mg/dL (ref 8.9–10.3)
Chloride: 103 mmol/L (ref 98–111)
Chloride: 103 mmol/L (ref 98–111)
Creatinine, Ser: 1.68 mg/dL — ABNORMAL HIGH (ref 0.44–1.00)
Creatinine, Ser: 1.37 mg/dL — ABNORMAL HIGH (ref 0.44–1.00)
GFR, Estimated: 32 mL/min — ABNORMAL LOW (ref >60)
GFR, Estimated: 41 mL/min — ABNORMAL LOW (ref >60)
Glucose, Bld: 67 mg/dL — ABNORMAL LOW (ref 70–99)
Glucose, Bld: 103 mg/dL — ABNORMAL HIGH (ref 70–99)
Potassium: 4.9 mmol/L (ref 3.5–5.1)
Potassium: 4.1 mmol/L (ref 3.5–5.1)
Sodium: 139 mmol/L (ref 135–145)
Sodium: 135 mmol/L (ref 135–145)
Total Bilirubin: 1.5 mg/dL — ABNORMAL HIGH (ref 0.3–1.2)
Total Bilirubin: 1.1 mg/dL (ref 0.3–1.2)
Total Protein: 7.2 g/dL (ref 6.5–8.1)
Total Protein: 6.4 g/dL — ABNORMAL LOW (ref 6.5–8.1)

## 2022-07-25 LAB — CBC WITH DIFFERENTIAL/PLATELET
Abs Immature Granulocytes: 0.05 10*3/uL (ref 0.00–0.07)
Basophils Absolute: 0.1 10*3/uL (ref 0.0–0.1)
Basophils Relative: 0 %
Eosinophils Absolute: 0 10*3/uL (ref 0.0–0.5)
Eosinophils Relative: 0 %
HCT: 34.3 % — ABNORMAL LOW (ref 36.0–46.0)
Hemoglobin: 11.1 g/dL — ABNORMAL LOW (ref 12.0–15.0)
Immature Granulocytes: 0 %
Lymphocytes Relative: 10 %
Lymphs Abs: 1.4 10*3/uL (ref 0.7–4.0)
MCH: 28.2 pg (ref 26.0–34.0)
MCHC: 32.4 g/dL (ref 30.0–36.0)
MCV: 87.1 fL (ref 80.0–100.0)
Monocytes Absolute: 0.7 10*3/uL (ref 0.1–1.0)
Monocytes Relative: 5 %
Neutro Abs: 11.9 10*3/uL — ABNORMAL HIGH (ref 1.7–7.7)
Neutrophils Relative %: 85 %
Platelets: 205 10*3/uL (ref 150–400)
RBC: 3.94 MIL/uL (ref 3.87–5.11)
RDW: 15 % (ref 11.5–15.5)
WBC: 14.2 10*3/uL — ABNORMAL HIGH (ref 4.0–10.5)
nRBC: 0 % (ref 0.0–0.2)

## 2022-07-25 LAB — ACETAMINOPHEN LEVEL
Acetaminophen (Tylenol), Serum: <10 ug/mL — ABNORMAL LOW (ref 10–30)
Acetaminophen (Tylenol), Serum: <10 ug/mL — ABNORMAL LOW (ref 10–30)

## 2022-07-25 LAB — SALICYLATE LEVEL
Salicylate Lvl: <7 mg/dL — ABNORMAL LOW (ref 7.0–30.0)
Salicylate Lvl: <7 mg/dL — ABNORMAL LOW (ref 7.0–30.0)

## 2022-07-25 LAB — RAPID URINE DRUG SCREEN, HOSP PERFORMED
Amphetamines: NOT DETECTED
Barbiturates: NOT DETECTED
Benzodiazepines: POSITIVE — AB
Cocaine: NOT DETECTED
Opiates: NOT DETECTED
Tetrahydrocannabinol: NOT DETECTED

## 2022-07-25 LAB — CBG MONITORING, ED
Glucose-Capillary: 85 mg/dL (ref 70–99)
Glucose-Capillary: 87 mg/dL (ref 70–99)
Glucose-Capillary: 111 mg/dL — ABNORMAL HIGH (ref 70–99)

## 2022-07-25 LAB — CK: Total CK: 235 U/L — ABNORMAL HIGH (ref 38–234)

## 2022-07-25 LAB — MAGNESIUM: Magnesium: 1.7 mg/dL (ref 1.7–2.4)

## 2022-07-25 LAB — LACTIC ACID, PLASMA
Lactic Acid, Venous: 1.4 mmol/L (ref 0.5–1.9)
Lactic Acid, Venous: 1.8 mmol/L (ref 0.5–1.9)

## 2022-07-25 LAB — PROTIME-INR
INR: 1 (ref 0.8–1.2)
Prothrombin Time: 13.8 seconds (ref 11.4–15.2)

## 2022-07-25 LAB — AMMONIA: Ammonia: 22 umol/L (ref 9–35)

## 2022-07-25 LAB — APTT: aPTT: 21 seconds — ABNORMAL LOW (ref 24–36)

## 2022-07-25 LAB — ETHANOL: Alcohol, Ethyl (B): <10 mg/dL (ref <10)

## 2022-07-25 MED ORDER — ADULT MULTIVITAMIN W/MINERALS CH
1.0000 | ORAL_TABLET | Freq: Every day | ORAL | Status: DC
  Administered 2022-07-25 – 2022-07-27 (×3): 1 via ORAL
  Filled 2022-07-25 (×3): qty 1

## 2022-07-25 MED ORDER — CHLORHEXIDINE GLUCONATE CLOTH 2 % EX PADS
6.0000 | MEDICATED_PAD | Freq: Every day | CUTANEOUS | Status: DC
  Administered 2022-07-25 – 2022-07-26 (×2): 6 via TOPICAL

## 2022-07-25 MED ORDER — IOHEXOL 350 MG/ML SOLN
75.0000 mL | Freq: Once | INTRAVENOUS | Status: AC | PRN
  Administered 2022-07-25: 60 mL via INTRAVENOUS

## 2022-07-25 MED ORDER — LACTATED RINGERS IV BOLUS
1000.0000 mL | Freq: Once | INTRAVENOUS | Status: AC
  Administered 2022-07-25: 1000 mL via INTRAVENOUS

## 2022-07-25 MED ORDER — THIAMINE HCL 100 MG/ML IJ SOLN
100.0000 mg | Freq: Every day | INTRAMUSCULAR | Status: DC
  Administered 2022-07-25 – 2022-07-26 (×2): 100 mg via INTRAVENOUS
  Filled 2022-07-25: qty 2

## 2022-07-25 MED ORDER — LORAZEPAM 1 MG PO TABS: 1.0000 mg | ORAL_TABLET | ORAL | Status: DC | PRN

## 2022-07-25 MED ORDER — SODIUM CHLORIDE 0.9 % IV SOLN
1.0000 g | Freq: Once | INTRAVENOUS | Status: AC
  Administered 2022-07-25: 1 g via INTRAVENOUS
  Filled 2022-07-25: qty 10

## 2022-07-25 MED ORDER — LORAZEPAM 2 MG/ML IJ SOLN: 1.0000 mg | INTRAMUSCULAR | Status: DC | PRN

## 2022-07-25 MED ORDER — DEXTROSE 5 % IV SOLN: Freq: Once | INTRAVENOUS | Status: AC

## 2022-07-25 MED ORDER — ORAL CARE MOUTH RINSE: 15.0000 mL | OROMUCOSAL | Status: DC | PRN

## 2022-07-25 MED ORDER — THIAMINE MONONITRATE 100 MG PO TABS
100.0000 mg | ORAL_TABLET | Freq: Every day | ORAL | Status: DC
  Administered 2022-07-27: 100 mg via ORAL
  Filled 2022-07-25 (×2): qty 1

## 2022-07-25 MED ORDER — FOLIC ACID 1 MG PO TABS
1.0000 mg | ORAL_TABLET | Freq: Every day | ORAL | Status: DC
  Administered 2022-07-25 – 2022-07-27 (×3): 1 mg via ORAL
  Filled 2022-07-25 (×3): qty 1

## 2022-07-25 NOTE — H&P (Signed)
History and Physical    Patient: Maureen Vaughn ZOX:[id number] DOB: 1950/04/08 DOA: 07/25/2022 DOS: the patient was seen and examined on 07/26/2022 PCP: Richardean Chimera, MD  Patient coming from: Home  Chief Complaint:  Chief Complaint  Patient presents with   Drug Overdose   HPI: Maureen Vaughn is a 72 y.o. female with medical history significant of GERD, pancreatic insufficiency who presents to the emergency department via EMS from home due to altered mental status.  At bedside, patient was only alert and oriented x 1(self).  History was obtained from granddaughter by phone, per report, patient went to visit her best friend and best friend's daughter called patient's granddaughter Maureen Vaughn) to come to get grandmother since she has taken several pills of Benadryl, BC powder and Valium and the patient has had several falls since she arrived at best friend's place and also that patient's mental status is currently altered.  Granddaughter went to patient's best friend's place and noted that she was very confused and altered, so she activated EMS and patient was taken to the ED for further evaluation and management.  ED Course:  In the emergency department, she was initially tachypneic with respiratory rate of 24/min, BP was 152/66, but other vital signs were within normal range.  Workup in the ED showed WBC of 14.2, hemoglobin 11.1, hematocrit 34.3, MCV 7.1, platelets 205. BMP was normal except for blood glucose of 67, BUN/creatinine 29/1.68 (baseline creatinine at 1.0).  Ammonia 22, magnesium 1.7, lactic acid was normal, troponin was 176 > 258 > 265 > 271, total CK 235.  Acetaminophen level was less than 10, salicylate level was less than 7, ethanol level was less than 10. Chest x-ray showed heterogeneous opacities left lung base may represent atelectasis or infection CT head without contrast showed no acute intracranial abnormality CT angiography of chest with and without contrast showed no evidence  of pulmonary artery embolism, no evidence of thoracic aortic dissection.  CT was suggestive of scarring or subsegmental atelectasis in the posterior lower lung fields. She was started on IV ceftriaxone for presumed UTI.  D5W was given due to hypoglycemia.  Patient was empirically placed on CIWA protocol, thiamine, folic acid and multivitamin.  Hospitalist was asked to admit patient for further evaluation and management.  Review of Systems: Review of systems as noted in the HPI. All other systems reviewed and are negative.   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 She has a 70.00 pack-year smoking history. She has never used smokeless tobacco. She reports that she does not drink alcohol and does not use drugs.   No Known Allergies  Family History  Adopted: Yes     Prior to Admission medications   Medication Sig Start Date End Date Taking? Authorizing Provider  atorvastatin (LIPITOR) 20 MG tablet Take 20 mg by mouth at bedtime. 12/19/18  Yes [provider]  CREON 36000-114000 units CPEP capsule TAKE ONE CAPSULE BY MOUTH THREE TIMES DAILY WITH MEALS Patient taking differently: Take 12,000-38,000 Units by mouth 3 (  three) times daily with meals. 10/06/21  Yes Doreatha Massed, MD  diazepam (VALIUM) 5 MG tablet Take 5 mg by mouth 2 (two) times daily.  07/09/14  Yes [provider]  DULoxetine (CYMBALTA) 60 MG capsule Take 60 mg by mouth daily. 06/01/16  Yes [provider]  ibandronate (BONIVA) 150 MG tablet Take by mouth every 30 (thirty) days.    Yes [provider]   metoprolol succinate (TOPROL-XL) 25 MG 24 hr tablet Take 12.5 mg by mouth daily.    Yes [provider]  omeprazole (PRILOSEC) 40 MG capsule Take 40 mg by mouth daily. 11/25/15  Yes [provider]  potassium chloride SA (KLOR-CON) 20 MEQ tablet Take 20 mEq by mouth daily. 09/19/20  Yes [provider]  traMADol (ULTRAM) 50 MG tablet 50 mg every 6 (six) hours as needed.  11/19/16  Yes [provider]    Physical Exam: BP (!) 137/59   Pulse 63   Temp 98.5 F (36.9 C) (Oral)   Resp (!) 27   Ht 5\' 2"  (1.575 m)   Wt 52.1 kg   SpO2 100%   BMI 21.01 kg/m   General: 72 y.o. year-old female ill appearing, but in no acute distress.  Alert and oriented x 1 (self). HEENT: NCAT Neck: Supple, trachea medial Cardiovascular: Regular rate and rhythm with no rubs or gallops.  No thyromegaly or JVD noted.  No lower extremity edema. 2/4 pulses in all 4 extremities. Respiratory: Clear to auscultation with no wheezes or rales.  Abdomen: Soft, nontender nondistended with normal bowel sounds x4 quadrants. Muskuloskeletal: No cyanosis, clubbing or edema noted bilaterally Neuro: Patient moving all extremities, no focal neurologic deficit.  Sensation, reflexes intact Skin: Several noted bruises on upper extremities. Psychiatry: Mood is appropriate for condition and setting          Labs on Admission:  Basic Metabolic Panel: Recent Labs  Lab 07/25/22 1415 07/25/22 1858  NA 139 135  K 4.9 4.1  CL 103 103  CO2 22 22  GLUCOSE 67* 103*  BUN 29* 27*  CREATININE 1.68* 1.37*  CALCIUM 9.7 9.0  MG 1.7  --    Liver Function Tests: Recent Labs  Lab 07/25/22 1415 07/25/22 1858  AST 34 37  ALT 27 26  ALKPHOS 79 67  BILITOT 1.5* 1.1  PROT 7.2 6.4*  ALBUMIN 4.3 3.7   No results for input(s): "LIPASE", "AMYLASE" in the last 168 hours. Recent Labs  Lab 07/25/22 1415  AMMONIA 22   CBC: Recent Labs  Lab 07/25/22 1415  WBC 14.2*  NEUTROABS 11.9*  HGB 11.1*   HCT 34.3*  MCV 87.1  PLT 205   Cardiac Enzymes: Recent Labs  Lab 07/25/22 1858  CKTOTAL 235*    BNP (last 3 results) No results for input(s): "BNP" in the last 8760 hours.  ProBNP (last 3 results) No results for input(s): "PROBNP" in the last 8760 hours.  CBG: Recent Labs  Lab 07/25/22 1603 07/25/22 1641 07/25/22 1734  GLUCAP 85 87 111*    Radiological Exams on Admission: CT Angio Chest PE W and/or Wo Contrast  Result Date: 07/25/2022 CLINICAL DATA:  Altered mental status, high clinical suspicion for PE EXAM: CT ANGIOGRAPHY CHEST WITH CONTRAST TECHNIQUE: Multidetector CT imaging of the chest was performed using the standard protocol during bolus administration of intravenous contrast. Multiplanar CT image reconstructions and MIPs were obtained to evaluate the vascular anatomy. RADIATION DOSE REDUCTION: This exam was performed according to the departmental dose-optimization  program which includes automated exposure control, adjustment of the mA and/or kV according to patient size and/or use of iterative reconstruction technique. CONTRAST:  60mL OMNIPAQUE IOHEXOL 350 MG/ML SOLN COMPARISON:  Chest radiographs done earlier today FINDINGS: Cardiovascular: There are no intraluminal filling defects in central pulmonary artery branches. Motion artifacts limit evaluation of small peripheral pulmonary artery branches. There is homogeneous enhancement in thoracic aorta. There are scattered calcifications and atherosclerotic plaques in thoracic aorta and its major branches. Coronary artery calcifications are seen. Mediastinum/Nodes: No significant lymphadenopathy is seen. Lungs/Pleura: There are small linear densities in the posterior lower lung fields, more so on the left side. There is prominent epicardial fat pad at left cardiophrenic angle which may account for increased density at the left costophrenic angle seen in the chest radiographs. Upper Abdomen: Gallbladder is not seen. Air is noted  in bile duct branches, possibly suggesting previous sphincterotomy. Small hiatal hernia is seen. Musculoskeletal: No acute findings are seen. There is encroachment of neural foramina by bony spurs in lower cervical spine. Review of the MIP images confirms the above findings. IMPRESSION: There is no evidence of pulmonary artery embolism. There is no evidence of thoracic aortic dissection. Aortic arteriosclerosis. Coronary artery calcifications are seen. Small linear densities are seen in the posterior lower lung fields suggesting scarring or subsegmental atelectasis. There is no focal pulmonary consolidation. There is prominence of epicardial fat pad at the left cardiophrenic angle which may account for blunting of left lateral CP angle seen in the chest radiographs. There is no pleural effusion or pneumothorax. Small hiatal hernia. Air in the bile ducts may be due to previous sphincterotomy. Electronically Signed   By: Ernie Avena M.D.   On: 07/25/2022 18:45   CT Head Wo Contrast  Result Date: 07/25/2022 CLINICAL DATA:  Altered mental status. EXAM: CT HEAD WITHOUT CONTRAST TECHNIQUE: Contiguous axial images were obtained from the base of the skull through the vertex without intravenous contrast. RADIATION DOSE REDUCTION: This exam was performed according to the departmental dose-optimization program which includes automated exposure control, adjustment of the mA and/or kV according to patient size and/or use of iterative reconstruction technique. COMPARISON:  06/06/2008 FINDINGS: Brain: No evidence of intracranial hemorrhage, acute infarction, hydrocephalus, extra-axial collection, or mass lesion/mass effect. 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 Mild cerebral atrophy and chronic small vessel disease also noted. Vascular:  No hyperdense vessel or other acute findings. Skull: No evidence of fracture or other significant bone abnormality. Sinuses/Orbits:  No acute  findings. Other: None. IMPRESSION: No acute intracranial abnormality. 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 Mild cerebral atrophy and chronic small vessel disease. Electronically Signed   By: Danae Orleans M.D.   On: 07/25/2022 15:21   DG Chest Port 1 View  Result Date: 07/25/2022 CLINICAL DATA:  Shortness of breath EXAM: PORTABLE CHEST 1 VIEW COMPARISON:  Chest radiograph 06/06/2008 FINDINGS: Low lung volume exam. Stable cardiac and mediastinal contours. Heterogeneous opacities left lung base. No pleural effusion or pneumothorax. Thoracic spine degenerative changes. IMPRESSION: Heterogeneous opacities left lung base may represent atelectasis or infection. Electronically Signed   By: Annia Belt M.D.   On: 07/25/2022 15:14    EKG: I independently viewed the EKG done and my findings are as followed: Normal sinus rhythm at rate of 72 bpm  Assessment/Plan Present on Admission:  Drug overdose, multiple drugs  Principal Problem:   Drug overdose, multiple drugs Active Problems:   Acute metabolic encephalopathy   Multiple  falls   AKI (acute kidney injury) (HCC)   Hypoglycemia   Leukocytosis   Elevated CK   Elevated troponin   GERD (gastroesophageal reflux disease)   Mixed hyperlipidemia   Pancreatic insufficiency  Drug overdose multiple drugs Patient was taking several Benadryl, BC powder and Valium Patient will be admitted to stepdown unit for closer monitoring. Continue telemetry Tylenol, salicylate, acetaminophen and, alcohol levels were normal EKG personally reviewed showed normal sinus rhythm with rate of 72 bpm Continue fall precautions, continue to monitor patient and treat accordingly  Acute metabolic encephalopathy possibly secondary to above Continue management as described above Continue fall precaution  Multiple falls Continue fall precautions Continue PT/OT eval and treat  Hypoglycemia-resolved Continue CBG and hypoglycemia protocol  Leukocytosis  possibly reactive WBC 14.2, continue to monitor WBC with morning labs  Questionable UTI POA Urinalysis only showed moderate leukocytes rare bacteria, she was empirically started on IV ceftriaxone.  However, patient denies any burning sensation or any other irritative bladder symptoms. Further antibiotics will be held at this time pending urine culture.  Elevated total CK Total CK 235; continue IV hydration  Elevated troponin possibly secondary to type II demand ischemia Troponin was 176 > 258 > 265 > 271 Cardiologist was consulted and recommended trending troponin per ED PA Patient denies chest pain  Acute kidney injury BUN/creatinine 29/1.68 (baseline creatinine at 1.0). Continue gentle hydration Renally adjust medications, avoid nephrotoxic agents/dehydration/hypotension  GERD Continue Protonix  Mixed hyperlipidemia Continue statin  Pancreatic insufficiency Continue Creon  Questionable alcohol abuse Granddaughter states that patient no longer drinks alcohol, she was empirically started on CIWA protocol and patient was not showing any withdrawal symptoms, this will be held at this time and will be restarted if patient starts to show any withdrawal symptoms. Continue multivitamin, thiamine and folic acid   DVT prophylaxis: Lovenox   Advance Care Planning: DNR.  (Confirmed with granddaughter-Maureen Vaughn-[phone])  Consults: None  Family Communication: Granddaughter by phone.  All questions answered to satisfaction  Severity of Illness: The appropriate patient status for this patient is OBSERVATION. Observation status is judged to be reasonable and necessary in order to provide the required intensity of service to ensure the patient's safety. The patient's presenting symptoms, physical exam findings, and initial radiographic and laboratory data in the context of their medical condition is felt to place them at decreased risk for further clinical deterioration. Furthermore, it  is anticipated that the patient will be medically stable for discharge from the hospital within 2 midnights of admission.   Author: Frankey Shown, DO 07/26/2022 1:20 AM  For on call review [email or web address].

## 2022-07-25 NOTE — ED Provider Notes (Signed)
[place] EMERGENCY DEPARTMENT AT [facility] Provider Note   CSN: [id number] Arrival date & time: 07/25/22  1340     History {Add pertinent medical, surgical, social history, OB history to HPI:1} Chief Complaint  Patient presents with   Drug Overdose    Maureen Vaughn is a 72 y.o. female.   Drug Overdose       Home Medications Prior to Admission medications   Medication Sig Start Date End Date Taking? Authorizing Provider  atorvastatin (LIPITOR) 20 MG tablet Take 20 mg by mouth at bedtime. 12/19/18   [provider]  Cranberry-Vitamin C-Probiotic (AZO CRANBERRY) 250-30 MG TABS Take 1 tablet by mouth daily.    [provider]  CREON 67341-937902 units CPEP capsule TAKE ONE CAPSULE BY MOUTH THREE TIMES DAILY WITH MEALS 10/06/21   Doreatha Massed, MD  diazepam (VALIUM) 5 MG tablet Take 5 mg by mouth 2 (two) times daily.  07/09/14   [provider]  DULoxetine (CYMBALTA) 60 MG capsule Take 60 mg by mouth daily. 06/01/16   [provider]  FLUoxetine (PROZAC) 20 MG capsule Take 20 mg by mouth daily. 09/19/20   [provider]  ibandronate (BONIVA) 150 MG tablet Take by mouth every 30 (thirty) days.     [provider]  metoprolol succinate (TOPROL-XL) 25 MG 24 hr tablet Take 12.5 mg by mouth daily.     [provider]  omeprazole (PRILOSEC) 40 MG capsule Take 40 mg by mouth daily. 11/25/15   [provider]  ondansetron (ZOFRAN) 4 MG tablet Take 4 mg by mouth every 8 (eight) hours as needed. 12/10/15   [provider]  potassium chloride SA (KLOR-CON) 20 MEQ tablet Take 20 mEq by mouth daily. 09/19/20   [provider]  SYMBICORT 160-4.5 MCG/ACT inhaler Inhale 2 puffs into the lungs 2 (two) times daily. 03/01/20   [provider]  traMADol (ULTRAM) 50 MG tablet 50 mg every 6 (six) hours as needed.  11/19/16   [provider]      Allergies    Patient has no known  allergies.    Review of Systems   Review of Systems  Physical Exam Updated Vital Signs BP 133/62   Pulse 77   Temp 98.6 F (37 C) (Axillary)   Resp (!) 21   SpO2 96%  Physical Exam  ED Results / Procedures / Treatments   Labs (all labs ordered are listed, but only abnormal results are displayed) Labs Reviewed  CBC WITH DIFFERENTIAL/PLATELET - Abnormal; Notable for the following components:      Result Value   WBC 14.2 (*)    Hemoglobin 11.1 (*)    HCT 34.3 (*)    Neutro Abs 11.9 (*)    All other components within normal limits  COMPREHENSIVE METABOLIC PANEL - Abnormal; Notable for the following components:   Glucose, Bld 67 (*)    BUN 29 (*)    Creatinine, Ser 1.68 (*)    Total Bilirubin 1.5 (*)    GFR, Estimated 32 (*)    All other components within normal limits  TROPONIN I (HIGH SENSITIVITY) - Abnormal; Notable for the following components:   Troponin I (High Sensitivity) 176 (*)    All other components within normal limits  MAGNESIUM  AMMONIA  LACTIC ACID, PLASMA  URINALYSIS, W/ REFLEX TO CULTURE (INFECTION SUSPECTED)  SALICYLATE LEVEL  ACETAMINOPHEN LEVEL  LACTIC ACID, PLASMA  RAPID URINE DRUG SCREEN, HOSP PERFORMED  ETHANOL  APTT  PROTIME-INR  CBG MONITORING, ED  TROPONIN I (HIGH SENSITIVITY)    EKG None  Radiology CT Head Wo Contrast  Result Date: 07/25/2022 CLINICAL DATA:  Altered mental status. EXAM: CT HEAD WITHOUT CONTRAST TECHNIQUE: Contiguous axial images were obtained from the base of the skull through the vertex without intravenous contrast. RADIATION DOSE REDUCTION: This exam was performed according to the departmental dose-optimization program which includes automated exposure control, adjustment of the mA and/or kV according to patient size and/or use of iterative reconstruction technique. COMPARISON:  06/06/2008 FINDINGS: Brain: No evidence of intracranial hemorrhage, acute infarction, hydrocephalus, extra-axial collection, or mass  lesion/mass effect. 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 Mild cerebral atrophy and chronic small vessel disease also noted. Vascular:  No hyperdense vessel or other acute findings. Skull: No evidence of fracture or other significant bone abnormality. Sinuses/Orbits:  No acute findings. Other: None. IMPRESSION: No acute intracranial abnormality. 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 Mild cerebral atrophy and chronic small vessel disease. Electronically Signed   By: Danae Orleans M.D.   On: 07/25/2022 15:21   DG Chest Port 1 View  Result Date: 07/25/2022 CLINICAL DATA:  Shortness of breath EXAM: PORTABLE CHEST 1 VIEW COMPARISON:  Chest radiograph 06/06/2008 FINDINGS: Low lung volume exam. Stable cardiac and mediastinal contours. Heterogeneous opacities left lung base. No pleural effusion or pneumothorax. Thoracic spine degenerative changes. IMPRESSION: Heterogeneous opacities left lung base may represent atelectasis or infection. Electronically Signed   By: Annia Belt M.D.   On: 07/25/2022 15:14    Procedures Procedures  {Document cardiac monitor, telemetry assessment procedure when appropriate:1}  Medications Ordered in ED Medications  dextrose 5 % solution (has no administration in time range)  LORazepam (ATIVAN) tablet 1-4 mg (has no administration in time range)    Or  LORazepam (ATIVAN) injection 1-4 mg (has no administration in time range)  thiamine (VITAMIN B1) tablet 100 mg (has no administration in time range)    Or  thiamine (VITAMIN B1) injection 100 mg (has no administration in time range)  folic acid (FOLVITE) tablet 1 mg (has no administration in time range)  multivitamin with minerals tablet 1 tablet (has no administration in time range)    ED Course/ Medical Decision Making/ A&P   {   Click here for ABCD2, HEART and other calculatorsREFRESH Note before signing :1}                          Medical Decision  Making Amount and/or Complexity of Data Reviewed Labs: ordered. Radiology: ordered.  Risk OTC drugs. Prescription drug management.   ***  {Document critical care time when appropriate:1} {Document review of labs and clinical decision tools ie heart score, Chads2Vasc2 etc:1}  {Document your independent review of radiology images, and any outside records:1} {Document your discussion with family members, caretakers, and with consultants:1} {Document social determinants of health affecting pt's care:1} {Document your decision making why or why not admission, treatments were needed:1} Final Clinical Impression(s) / ED Diagnoses Final diagnoses:  None    Rx / DC Orders ED Discharge Orders     None

## 2022-07-25 NOTE — ED Notes (Signed)
Report given to ICU, they will be ready in 

## 2022-07-25 NOTE — H&P (Incomplete)
  History and Physical    Patient: Maureen Vaughn ZOX:[id number] DOB: [birth year] DOA: 07/25/2022 DOS: the patient was seen and examined on 07/25/2022 PCP: Richardean Chimera, MD  Patient coming from: Home  Chief Complaint:  Chief Complaint  Patient presents with  . Drug Overdose   HPI: Maureen Vaughn is a 72 y.o. female with medical history significant of GERD, pancreatic insufficiency who presents to the emergency department via EMS from home due to altered mental status.  At bedside, patient was only alert and oriented x 1      Review of Systems: {ROS_Text:26778} Past Medical History:  Diagnosis Date  . 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 Cancer [facility])    pancreatic  . Chronic abdominal pain   . Chronic diarrhea   . Depression   . Falls frequently   . Fracture of right humerus   . Hip fracture, right (HCC)   . Hypotension    Past Surgical History:  Procedure Laterality Date  . GASTROSTOMY TUBE PLACEMENT     Social History:  reports that she quit smoking about 11 years ago. She has a 70.00 pack-year smoking history. She has never used smokeless tobacco. She reports that she does not drink alcohol and does not use drugs.  No Known Allergies  Family History  Adopted: Yes    Prior to Admission medications   Medication Sig Start Date End Date Taking? Authorizing Provider  atorvastatin (LIPITOR) 20 MG tablet Take 20 mg by mouth at bedtime. 12/19/18  Yes [provider]  CREON 36000-114000 units CPEP capsule TAKE ONE CAPSULE BY MOUTH THREE TIMES DAILY WITH MEALS Patient taking differently: Take 12,000-38,000 Units by mouth 3 (three) times daily with meals. 10/06/21  Yes Doreatha Massed, MD  diazepam (VALIUM) 5 MG tablet Take 5 mg by mouth 2 (two) times daily.  07/09/14  Yes [provider]  DULoxetine (CYMBALTA) 60 MG capsule Take 60 mg by mouth daily. 06/01/16  Yes [provider]  ibandronate (BONIVA) 150 MG tablet Take by mouth every  30 (thirty) days.    Yes [provider]  metoprolol succinate (TOPROL-XL) 25 MG 24 hr tablet Take 12.5 mg by mouth daily.    Yes [provider]  omeprazole (PRILOSEC) 40 MG capsule Take 40 mg by mouth daily. 11/25/15  Yes [provider]  potassium chloride SA (KLOR-CON) 20 MEQ tablet Take 20 mEq by mouth daily. 09/19/20  Yes [provider]  traMADol (ULTRAM) 50 MG tablet 50 mg every 6 (six) hours as needed.  11/19/16  Yes [provider]    Physical Exam: Vitals:   07/25/22 1415 07/25/22 1654 07/25/22 1920 07/25/22 2000  BP: 133/62  (!) 150/70 107/74  Pulse: 77 81 72   Resp: (!) 21 19 18 16   Temp:   98.6 F (37 C) 97.8 F (36.6 C)  TempSrc:   Oral Oral  SpO2: 96% 100% 100% 98%   *** Data Reviewed: {Tip this will not be part of the note when signed- Document your independent interpretation of telemetry tracing, EKG, lab, Radiology test or any other diagnostic tests. Add any new diagnostic test ordered today. (Optional):26781} {Results:26384}  Assessment and Plan: No notes have been filed under this hospital service. Service: Hospitalist     Advance Care Planning:   Code Status: Not on file ***  Consults: ***  Family Communication: ***  Severity of Illness: {Observation/Inpatient:21159}  Author: Frankey Shown, DO 07/25/2022 9:17 PM  For on call review [email or web address].

## 2022-07-25 NOTE — ED Triage Notes (Signed)
Per EMS  AMS Aox1 (to place) Hx of overtaking medication Benadryl missing from pt apt BC powder missing Valium is missing.   119/74 CBG 88 HR 82 20 RR 97% RA

## 2022-07-25 NOTE — Plan of Care (Signed)

## 2022-07-25 NOTE — ED Notes (Signed)
Pt helped back on bedpan to void.

## 2022-07-25 NOTE — ED Notes (Addendum)
Call to poison control to update on follow up EKG and labs

## 2022-07-25 NOTE — ED Notes (Signed)
Ethanol was not run, will reorder

## 2022-07-25 NOTE — ED Notes (Signed)
Pt was attempting to get out of bed to use bathroom.  Helped pt reposition in bed and use bedpan.  Pt had a very difficult time following directions.  Pt was able to tell me her name but gave me her birthday wrong.  She is not able to tell me current events and is telling me that Laural Benes is running for president but I worry he is going to get shot".  Pt helped on the bedpan several times and she did void a large amount.  SItter with other pt asked to move to area where this pt can be visualized for safety.  Will continue to closely monitor.  Pt denies any ETOH use, no tremor or diaphoresis noted

## 2022-07-25 NOTE — ED Notes (Signed)
Patient transported to CT 

## 2022-07-25 NOTE — ED Notes (Signed)
ED TO INPATIENT HANDOFF REPORT  ED Nurse Name and Phone #: Alvino Chapel [phone]  S Name/Age/Gender Maureen Vaughn 72 y.o. female Room/Bed: APA17/APA17  Code Status   Code Status: Not on file  Home/SNF/Other Home Patient oriented to: self Is this baseline? No   Triage Complete: Triage complete  Chief Complaint Drug overdose, multiple drugs [T50.911A]  Triage Note Per EMS  AMS Aox1 (to place) Hx of overtaking medication Benadryl missing from pt apt BC powder missing Valium is missing.   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   Cancer (HCC)    pancreatic   Chronic abdominal pain    Chronic diarrhea    Depression    Falls frequently    Fracture of right humerus    Hip fracture, right (HCC)    Hypotension    Past Surgical History:  Procedure Laterality Date   GASTROSTOMY TUBE PLACEMENT       A IV Location/Drains/Wounds Patient Lines/Drains/Airways Status     Active Line/Drains/Airways     Name Placement date Placement time Site Days   Peripheral IV 07/25/22 22 G Left;Posterior Wrist 07/25/22  1613  Wrist  less than 1   Peripheral IV 07/25/22 20 G Anterior;Proximal;Right Forearm 07/25/22  1726  Forearm  less than 1   PICC Single Lumen 03/31/15 PICC Right Basilic 36 cm 1 cm 03/31/15  0865  Basilic  2673            Intake/Output Last 24 hours  Intake/Output Summary (Last 24 hours)  at 07/25/2022 2141 Last data filed at 07/25/2022 2014 Gross per 24 hour  Intake 1000 ml  Output --  Net 1000 ml    Labs/Imaging Results for orders placed or performed during the hospital encounter of 07/25/22 (from the past 48 hour(s))  Magnesium     Status: None   Collection Time: 07/25/22  2:15 PM  Result Value Ref Range   Magnesium 1.7 1.7 - 2.4 mg/dL    Comment: Performed at [facility], [address] [place], [place] [address]  CBC with Differential     Status: Abnormal   Collection Time: 07/25/22  2:15 PM  Result Value Ref Range   WBC 14.2 (H) 4.0 - 10.5 K/uL   RBC 3.94 3.87 - 5.11 MIL/uL   Hemoglobin 11.1 (L) 12.0 - 15.0 g/dL   HCT 62.9 (L) 52.8 - 41.3 %   MCV 87.1 80.0 - 100.0 fL   MCH 28.2 26.0 - 34.0 pg   MCHC 32.4 30.0 - 36.0 g/dL   RDW 24.4 01.0 - 27.2 %   Platelets 205 150 - 400 K/uL    Comment: REPEATED TO VERIFY   nRBC 0.0 0.0 - 0.2 %   Neutrophils Relative % 85 %   Neutro Abs 11.9 (H) 1.7 - 7.7 K/uL   Lymphocytes Relative 10 %   Lymphs Abs 1.4 0.7 - 4.0 K/uL   Monocytes Relative  5 %   Monocytes Absolute 0.7 0.1 - 1.0 K/uL   Eosinophils Relative 0 %   Eosinophils Absolute 0.0 0.0 - 0.5 K/uL   Basophils Relative 0 %   Basophils Absolute 0.1 0.0 - 0.1 K/uL   Immature Granulocytes 0 %   Abs Immature Granulocytes 0.05 0.00 - 0.07 K/uL    Comment: Performed at [facility], [address], [place], [place] [address]  Comprehensive metabolic panel     Status: Abnormal   Collection Time: 07/25/22  2:15 PM  Result Value Ref Range   Sodium 139 135 - 145 mmol/L   Potassium 4.9 3.5 - 5.1 mmol/L   Chloride 103 98 - 111 mmol/L   CO2 22 22 - 32 mmol/L   Glucose, Bld 67 (L) 70 - 99 mg/dL    Comment: Glucose reference range applies only to samples taken after fasting for at least 8 hours.   BUN 29 (H) 8 - 23 mg/dL   Creatinine, Ser 6.04 (H) 0.44 - 1.00 mg/dL   Calcium 9.7 8.9 - 54.0 mg/dL   Total Protein 7.2 6.5 - 8.1 g/dL   Albumin 4.3 3.5 - 5.0 g/dL   AST 34 15  - 41 U/L   ALT 27 0 - 44 U/L   Alkaline Phosphatase 79 38 - 126 U/L   Total Bilirubin 1.5 (H) 0.3 - 1.2 mg/dL   GFR, Estimated 32 (L) >60 mL/min    Comment: (NOTE) Calculated using the CKD-EPI Creatinine Equation (2021)    Anion gap 14 5 - 15    Comment: Performed at [facility], [address] [place], [place] [address]  Ammonia     Status: None   Collection Time: 07/25/22  2:15 PM  Result Value Ref Range   Ammonia 22 9 - 35 umol/L    Comment: Performed at [facility], [address], [place], [place] [address]  Troponin I (High Sensitivity)     Status: Abnormal   Collection Time: 07/25/22  2:15 PM  Result Value Ref Range   Troponin I (High Sensitivity) 176 (HH) <18 ng/L    Comment: CRITICAL RESULT CALLED TO, READ BACK BY AND VERIFIED WITH WILLIAMS, K AT 1539 07/25/22 BY SMN. (NOTE) Elevated high sensitivity troponin I (hsTnI) values and significant  changes across serial measurements may suggest ACS but many other  chronic and acute conditions are known to elevate hsTnI results.  Refer to the "Links" section for chest pain algorithms and additional  guidance. Performed at [facility], [address], [place], [place] [address]   Lactic acid, plasma     Status: None   Collection Time: 07/25/22  2:15 PM  Result Value Ref Range   Lactic Acid, Venous 1.4 0.5 - 1.9 mmol/L    Comment: Performed at [facility], [address] [place], [place] [address]  Urinalysis, w/ Reflex to Culture (Infection Suspected) -Urine, Clean Catch     Status: Abnormal   Collection Time: 07/25/22  4:00 PM  Result Value Ref Range   Specimen Source URINE, CLEAN CATCH    Color, Urine YELLOW YELLOW   APPearance HAZY (A) CLEAR   Specific Gravity, Urine 1.016 1.005 - 1.030   pH 5.0 5.0 - 8.0   Glucose, UA NEGATIVE NEGATIVE mg/dL   Hgb urine dipstick SMALL (A) NEGATIVE   Bilirubin Urine NEGATIVE NEGATIVE   Ketones, ur NEGATIVE NEGATIVE mg/dL   Protein, ur NEGATIVE NEGATIVE mg/dL   Nitrite NEGATIVE  NEGATIVE   Leukocytes,Ua MODERATE (A) NEGATIVE  RBC / HPF 11-20 0 - 5 RBC/hpf   WBC, UA 21-50 0 - 5 WBC/hpf    Comment:        Reflex urine culture not performed if WBC <=10, OR if Squamous epithelial cells >5. If Squamous epithelial cells >5 suggest recollection.    Bacteria, UA RARE (A) NONE SEEN   Squamous Epithelial / HPF 0-5 0 - 5 /HPF   Mucus PRESENT    Hyaline Casts, UA PRESENT     Comment: Performed at [facility], [address], [place], [place] [address]  Rapid urine drug screen (hospital performed)     Status: Abnormal   Collection Time: 07/25/22  4:00 PM  Result Value Ref Range   Opiates NONE DETECTED NONE DETECTED   Cocaine NONE DETECTED NONE DETECTED   Benzodiazepines POSITIVE (A) NONE DETECTED   Amphetamines NONE DETECTED NONE DETECTED   Tetrahydrocannabinol NONE DETECTED NONE DETECTED   Barbiturates NONE DETECTED NONE DETECTED    Comment: (NOTE) DRUG SCREEN FOR MEDICAL PURPOSES ONLY.  IF CONFIRMATION IS NEEDED FOR ANY PURPOSE, NOTIFY LAB WITHIN 5 DAYS.  LOWEST DETECTABLE LIMITS FOR URINE DRUG SCREEN Drug Class                     Cutoff (ng/mL) Amphetamine and metabolites    1000 Barbiturate and metabolites    200 Benzodiazepine                 200 Opiates and metabolites        300 Cocaine and metabolites        300 THC                            50 Performed at [facility], [address], [place], [place] [address]   POC CBG, ED     Status: None   Collection Time: 07/25/22  4:03 PM  Result Value Ref Range   Glucose-Capillary 85 70 - 99 mg/dL    Comment: Glucose reference range applies only to samples taken after fasting for at least 8 hours.  Salicylate level     Status: Abnormal   Collection Time: 07/25/22  4:10 PM  Result Value Ref Range   Salicylate Lvl <7.0 (L) 7.0 - 30.0 mg/dL    Comment: Performed at [facility], [address] [place], [place] [address]  Acetaminophen level     Status: Abnormal   Collection Time: 07/25/22  4:10 PM   Result Value Ref Range   Acetaminophen (Tylenol), Serum <10 (L) 10 - 30 ug/mL    Comment: (NOTE) Therapeutic concentrations vary significantly. A range of 10-30 ug/mL  may be an effective concentration for many patients. However, some  are best treated at concentrations outside of this range. Acetaminophen concentrations >150 ug/mL at 4 hours after ingestion  and >50 ug/mL at 12 hours after ingestion are often associated with  toxic reactions.  Performed at [facility], [address], [place], [place] [address]   Lactic acid, plasma     Status: None   Collection Time: 07/25/22  4:10 PM  Result Value Ref Range   Lactic Acid, Venous 1.8 0.5 - 1.9 mmol/L    Comment: Performed at [facility], [address] [place], [place] [address]  Troponin I (High Sensitivity)     Status: Abnormal   Collection Time: 07/25/22  4:10 PM  Result Value Ref Range   Troponin I (High Sensitivity) 258 (HH) <18  ng/L    Comment: CRITICAL VALUE NOTED. VALUE IS CONSISTENT WITH PREVIOUSLY CRITICAL REPORTED/CALLED VALUE (NOTE) Elevated high sensitivity troponin I (hsTnI) values and significant  changes across serial measurements may suggest ACS but many other  chronic and acute conditions are known to elevate hsTnI results.  Refer to the "Links" section for chest pain algorithms and additional  guidance. Performed at [facility], [address], [place], [place] [address]   Ethanol     Status: None   Collection Time: 07/25/22  4:10 PM  Result Value Ref Range   Alcohol, Ethyl (B) <10 <10 mg/dL    Comment: (NOTE) Lowest detectable limit for serum alcohol is 10 mg/dL.  For medical purposes only. Performed at [facility], [address], [place], [place] [address]   APTT     Status: Abnormal   Collection Time: 07/25/22  4:10 PM  Result Value Ref Range   aPTT 21 (L) 24 - 36 seconds    Comment: Performed at [facility], [address] [place], [place] [address]  Protime-INR     Status: None   Collection  Time: 07/25/22  4:10 PM  Result Value Ref Range   Prothrombin Time 13.8 11.4 - 15.2 seconds   INR 1.0 0.8 - 1.2    Comment: (NOTE) INR goal varies based on device and disease states. Performed at [facility], [address] [place], [place] [address]   CBG monitoring, ED     Status: None   Collection Time: 07/25/22  4:41 PM  Result Value Ref Range   Glucose-Capillary 87 70 - 99 mg/dL    Comment: Glucose reference range applies only to samples taken after fasting for at least 8 hours.  POC CBG, ED     Status: Abnormal   Collection Time: 07/25/22  5:34 PM  Result Value Ref Range   Glucose-Capillary 111 (H) 70 - 99 mg/dL    Comment: Glucose reference range applies only to samples taken after fasting for at least 8 hours.  Acetaminophen level     Status: Abnormal   Collection Time: 07/25/22  6:58 PM  Result Value Ref Range   Acetaminophen (Tylenol), Serum <10 (L) 10 - 30 ug/mL    Comment: (NOTE) Therapeutic concentrations vary significantly. A range of 10-30 ug/mL  may be an effective concentration for many patients. However, some  are best treated at concentrations outside of this range. Acetaminophen concentrations >150 ug/mL at 4 hours after ingestion  and >50 ug/mL at 12 hours after ingestion are often associated with  toxic reactions.  Performed at [facility], [address] [place], [place] [address]   Salicylate level     Status: Abnormal   Collection Time: 07/25/22  6:58 PM  Result Value Ref Range   Salicylate Lvl <7.0 (L) 7.0 - 30.0 mg/dL    Comment: Performed at [facility], [address] [place], [place] [address]  Troponin I (High Sensitivity)     Status: Abnormal   Collection Time: 07/25/22  6:58 PM  Result Value Ref Range   Troponin I (High Sensitivity) 265 (HH) <18 ng/L    Comment: CRITICAL VALUE NOTED. VALUE IS CONSISTENT WITH PREVIOUSLY CRITICAL REPORTED/CALLED VALUE (NOTE) Elevated high sensitivity troponin I (hsTnI) values and significant  changes  across serial measurements may suggest ACS but many other  chronic and acute conditions are known to elevate hsTnI results.  Refer to the "Links" section for chest pain algorithms and additional  guidance. Performed at [facility], [address], [place], [place] [address]   Comprehensive metabolic panel     Status: Abnormal   Collection Time: 07/25/22  6:58 PM  Result Value Ref Range   Sodium 135 135 - 145 mmol/L   Potassium 4.1 3.5 - 5.1 mmol/L   Chloride 103 98 - 111 mmol/L   CO2 22 22 - 32 mmol/L   Glucose, Bld 103 (H) 70 - 99 mg/dL    Comment: Glucose reference range applies only to samples taken after fasting for at least 8 hours.   BUN 27 (H) 8 - 23 mg/dL   Creatinine, Ser 6.04 (H) 0.44 - 1.00 mg/dL   Calcium 9.0 8.9 - 54.0 mg/dL   Total Protein 6.4 (L) 6.5 - 8.1 g/dL   Albumin 3.7 3.5 - 5.0 g/dL   AST 37 15 - 41 U/L   ALT 26 0 - 44 U/L   Alkaline Phosphatase 67 38 - 126 U/L   Total Bilirubin 1.1 0.3 - 1.2 mg/dL   GFR, Estimated 41 (L) >60 mL/min    Comment: (NOTE) Calculated using the CKD-EPI Creatinine Equation (2021)    Anion gap 10 5 - 15    Comment: Performed at [facility], [address] [place], [place] [address]  CK     Status: Abnormal   Collection Time: 07/25/22  6:58 PM  Result Value Ref Range   Total CK 235 (H) 38 - 234 U/L    Comment: Performed at [facility], [address], [place], [place] [address]   CT Angio Chest PE W and/or Wo Contrast  Result Date: 07/25/2022 CLINICAL DATA:  Altered mental status, high clinical suspicion for PE EXAM: CT ANGIOGRAPHY CHEST WITH CONTRAST TECHNIQUE: Multidetector CT imaging of the chest was performed using the standard protocol during bolus administration of intravenous contrast. Multiplanar CT image reconstructions and MIPs were obtained to evaluate the vascular anatomy. RADIATION DOSE REDUCTION: This exam was performed according to the departmental dose-optimization program which includes automated exposure control,  adjustment of the mA and/or kV according to patient size and/or use of iterative reconstruction technique. CONTRAST:  60mL OMNIPAQUE IOHEXOL 350 MG/ML SOLN COMPARISON:  Chest radiographs done earlier today FINDINGS: Cardiovascular: There are no intraluminal filling defects in central pulmonary artery branches. Motion artifacts limit evaluation of small peripheral pulmonary artery branches. There is homogeneous enhancement in thoracic aorta. There are scattered calcifications and atherosclerotic plaques in thoracic aorta and its major branches. Coronary artery calcifications are seen. Mediastinum/Nodes: No significant lymphadenopathy is seen. Lungs/Pleura: There are small linear densities in the posterior lower lung fields, more so on the left side. There is prominent epicardial fat pad at left cardiophrenic angle which may account for increased density at the left costophrenic angle seen in the chest radiographs. Upper Abdomen: Gallbladder is not seen. Air is noted in bile duct branches, possibly suggesting previous sphincterotomy. Small hiatal hernia is seen. Musculoskeletal: No acute findings are seen. There is encroachment of neural foramina by bony spurs in lower cervical spine. Review of the MIP images confirms the above findings. IMPRESSION: There is no evidence of pulmonary artery embolism. There is no evidence of thoracic aortic dissection. Aortic arteriosclerosis. Coronary artery calcifications are seen. Small linear densities are seen in the posterior lower lung fields suggesting scarring or subsegmental atelectasis. There is no focal pulmonary consolidation. There is prominence of epicardial fat pad at the left cardiophrenic angle which may account for blunting of left lateral CP angle seen in the chest radiographs. There is no pleural effusion or pneumothorax.  Small hiatal hernia. Air in the bile ducts may be due to previous sphincterotomy. Electronically Signed   By: Ernie Avena M.D.   On:  07/25/2022 18:45   CT Head Wo Contrast  Result Date: 07/25/2022 CLINICAL DATA:  Altered mental status. EXAM: CT HEAD WITHOUT CONTRAST TECHNIQUE: Contiguous axial images were obtained from the base of the skull through the vertex without intravenous contrast. RADIATION DOSE REDUCTION: This exam was performed according to the departmental dose-optimization program which includes automated exposure control, adjustment of the mA and/or kV according to patient size and/or use of iterative reconstruction technique. COMPARISON:  06/06/2008 FINDINGS: Brain: No evidence of intracranial hemorrhage, acute infarction, hydrocephalus, extra-axial collection, or mass lesion/mass effect. 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 Mild cerebral atrophy and chronic small vessel disease also noted. Vascular:  No hyperdense vessel or other acute findings. Skull: No evidence of fracture or other significant bone abnormality. Sinuses/Orbits:  No acute findings. Other: None. IMPRESSION: No acute intracranial abnormality. 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 Mild cerebral atrophy and chronic small vessel disease. Electronically Signed   By: Danae Orleans M.D.   On: 07/25/2022 15:21   DG Chest Port 1 View  Result Date: 07/25/2022 CLINICAL DATA:  Shortness of breath EXAM: PORTABLE CHEST 1 VIEW COMPARISON:  Chest radiograph 06/06/2008 FINDINGS: Low lung volume exam. Stable cardiac and mediastinal contours. Heterogeneous opacities left lung base. No pleural effusion or pneumothorax. Thoracic spine degenerative changes. IMPRESSION: Heterogeneous opacities left lung base may represent atelectasis or infection. Electronically Signed   By: Annia Belt M.D.   On: 07/25/2022 15:14    Pending Labs Unresulted Labs (From admission, onward)     Start     Ordered   07/25/22 1600  Urine Culture  Once,   R        07/25/22 1600            Vitals/Pain Today's Vitals   07/25/22 1654  07/25/22 1920 07/25/22 2000 07/25/22 2135  BP:  (!) 150/70 107/74 (!) 153/61  Pulse: 81 72  66  Resp: 19 18 16 19   Temp:  98.6 F (37 C) 97.8 F (36.6 C) 98.8 F (37.1 C)  TempSrc:  Oral Oral Oral  SpO2: 100% 100% 98% 100%  PainSc:    0-No pain    Isolation Precautions No active isolations  Medications Medications  LORazepam (ATIVAN) tablet 1-4 mg (has no administration in time range)    Or  LORazepam (ATIVAN) injection 1-4 mg (has no administration in time range)  thiamine (VITAMIN B1) tablet 100 mg ( Oral See Alternative 07/25/22 1650)    Or  thiamine (VITAMIN B1) injection 100 mg (100 mg Intravenous Given 07/25/22 1650)  folic acid (FOLVITE) tablet 1 mg (1 mg Oral Given 07/25/22 1651)  multivitamin with minerals tablet 1 tablet (1 tablet Oral Given 07/25/22 1651)  cefTRIAXone (ROCEPHIN) 1 g in sodium chloride 0.9 % 100 mL IVPB (1 g Intravenous New Bag/Given 07/25/22 2139)  dextrose 5 % solution ( Intravenous New Bag/Given 07/25/22 1650)  lactated ringers bolus 1,000 mL (0 mLs Intravenous Stopped 07/25/22 2014)  iohexol (OMNIPAQUE) 350 MG/ML injection 75 mL (60 mLs Intravenous Contrast Given 07/25/22 1749)    Mobility I have not walked her, I have helped her to the bedpan     Focused Assessments Pt is very confused, helped to bedpan to void   R Recommendations: See Admitting Provider Note  Report given to:   Additional Notes:

## 2022-07-26 DIAGNOSIS — Z79899 Other long term (current) drug therapy: Secondary | ICD-10-CM | POA: Diagnosis not present

## 2022-07-26 DIAGNOSIS — T50911A Poisoning by multiple unspecified drugs, medicaments and biological substances, accidental (unintentional), initial encounter: Secondary | ICD-10-CM | POA: Diagnosis not present

## 2022-07-26 DIAGNOSIS — R7989 Other specified abnormal findings of blood chemistry: Secondary | ICD-10-CM | POA: Diagnosis not present

## 2022-07-26 DIAGNOSIS — G9341 Metabolic encephalopathy: Secondary | ICD-10-CM | POA: Diagnosis not present

## 2022-07-26 DIAGNOSIS — R748 Abnormal levels of other serum enzymes: Secondary | ICD-10-CM | POA: Insufficient documentation

## 2022-07-26 DIAGNOSIS — N3001 Acute cystitis with hematuria: Secondary | ICD-10-CM | POA: Diagnosis not present

## 2022-07-26 DIAGNOSIS — D72829 Elevated white blood cell count, unspecified: Secondary | ICD-10-CM | POA: Diagnosis not present

## 2022-07-26 DIAGNOSIS — E162 Hypoglycemia, unspecified: Secondary | ICD-10-CM | POA: Insufficient documentation

## 2022-07-26 DIAGNOSIS — E782 Mixed hyperlipidemia: Secondary | ICD-10-CM | POA: Insufficient documentation

## 2022-07-26 DIAGNOSIS — Z87891 Personal history of nicotine dependence: Secondary | ICD-10-CM | POA: Diagnosis not present

## 2022-07-26 DIAGNOSIS — Z8507 Personal history of malignant neoplasm of pancreas: Secondary | ICD-10-CM | POA: Diagnosis not present

## 2022-07-26 DIAGNOSIS — N179 Acute kidney failure, unspecified: Secondary | ICD-10-CM | POA: Insufficient documentation

## 2022-07-26 DIAGNOSIS — K8689 Other specified diseases of pancreas: Secondary | ICD-10-CM

## 2022-07-26 DIAGNOSIS — K219 Gastro-esophageal reflux disease without esophagitis: Secondary | ICD-10-CM | POA: Insufficient documentation

## 2022-07-26 DIAGNOSIS — R296 Repeated falls: Secondary | ICD-10-CM

## 2022-07-26 LAB — COMPREHENSIVE METABOLIC PANEL
ALT: 24 U/L (ref 0–44)
AST: 34 U/L (ref 15–41)
Albumin: 3.4 g/dL — ABNORMAL LOW (ref 3.5–5.0)
Alkaline Phosphatase: 67 U/L (ref 38–126)
Anion gap: 11 (ref 5–15)
BUN: 23 mg/dL (ref 8–23)
CO2: 20 mmol/L — ABNORMAL LOW (ref 22–32)
Calcium: 9 mg/dL (ref 8.9–10.3)
Chloride: 107 mmol/L (ref 98–111)
Creatinine, Ser: 1.26 mg/dL — ABNORMAL HIGH (ref 0.44–1.00)
GFR, Estimated: 46 mL/min — ABNORMAL LOW (ref >60)
Glucose, Bld: 86 mg/dL (ref 70–99)
Potassium: 3.4 mmol/L — ABNORMAL LOW (ref 3.5–5.1)
Sodium: 138 mmol/L (ref 135–145)
Total Bilirubin: 1.3 mg/dL — ABNORMAL HIGH (ref 0.3–1.2)
Total Protein: 6 g/dL — ABNORMAL LOW (ref 6.5–8.1)

## 2022-07-26 LAB — CBC
HCT: 32.9 % — ABNORMAL LOW (ref 36.0–46.0)
Hemoglobin: 10.8 g/dL — ABNORMAL LOW (ref 12.0–15.0)
MCH: 28.4 pg (ref 26.0–34.0)
MCHC: 32.8 g/dL (ref 30.0–36.0)
MCV: 86.6 fL (ref 80.0–100.0)
Platelets: 229 10*3/uL (ref 150–400)
RBC: 3.8 MIL/uL — ABNORMAL LOW (ref 3.87–5.11)
RDW: 15.1 % (ref 11.5–15.5)
WBC: 15.2 10*3/uL — ABNORMAL HIGH (ref 4.0–10.5)
nRBC: 0 % (ref 0.0–0.2)

## 2022-07-26 LAB — GLUCOSE, CAPILLARY
Glucose-Capillary: 146 mg/dL — ABNORMAL HIGH (ref 70–99)
Glucose-Capillary: 184 mg/dL — ABNORMAL HIGH (ref 70–99)
Glucose-Capillary: 84 mg/dL (ref 70–99)
Glucose-Capillary: 90 mg/dL (ref 70–99)

## 2022-07-26 LAB — URINE CULTURE

## 2022-07-26 LAB — MAGNESIUM: Magnesium: 1.4 mg/dL — ABNORMAL LOW (ref 1.7–2.4)

## 2022-07-26 LAB — MRSA NEXT GEN BY PCR, NASAL: MRSA by PCR Next Gen: NOT DETECTED

## 2022-07-26 LAB — PHOSPHORUS: Phosphorus: 3.2 mg/dL (ref 2.5–4.6)

## 2022-07-26 MED ORDER — POTASSIUM CHLORIDE CRYS ER 20 MEQ PO TBCR
40.0000 meq | EXTENDED_RELEASE_TABLET | Freq: Once | ORAL | Status: AC
  Filled 2022-07-26: qty 2

## 2022-07-26 MED ORDER — PANCRELIPASE (LIP-PROT-AMYL) 12000-38000 UNITS PO CPEP
36000.0000 [IU] | ORAL_CAPSULE | Freq: Three times a day (TID) | ORAL | Status: DC
  Administered 2022-07-26 – 2022-07-27 (×4): 36000 [IU] via ORAL
  Filled 2022-07-26: qty 3
  Filled 2022-07-26 (×3): qty 1

## 2022-07-26 MED ORDER — ONDANSETRON HCL 4 MG/2ML IJ SOLN
4.0000 mg | Freq: Four times a day (QID) | INTRAMUSCULAR | Status: DC | PRN
  Administered 2022-07-26 (×2): 4 mg via INTRAVENOUS
  Filled 2022-07-26 (×2): qty 2

## 2022-07-26 MED ORDER — ONDANSETRON HCL 4 MG PO TABS
4.0000 mg | ORAL_TABLET | Freq: Four times a day (QID) | ORAL | Status: DC | PRN
  Administered 2022-07-26: 4 mg via ORAL
  Filled 2022-07-26: qty 1

## 2022-07-26 MED ORDER — ENOXAPARIN SODIUM 30 MG/0.3ML IJ SOSY: 30.0000 mg | PREFILLED_SYRINGE | INTRAMUSCULAR | Status: DC

## 2022-07-26 MED ORDER — ENSURE ENLIVE PO LIQD
237.0000 mL | Freq: Two times a day (BID) | ORAL | Status: DC
  Administered 2022-07-26 (×2): 237 mL via ORAL

## 2022-07-26 MED ORDER — ATORVASTATIN CALCIUM 20 MG PO TABS
20.0000 mg | ORAL_TABLET | Freq: Every day | ORAL | Status: DC
  Administered 2022-07-26 (×2): 20 mg via ORAL
  Filled 2022-07-26 (×2): qty 1

## 2022-07-26 MED ORDER — SODIUM CHLORIDE 0.9 % IV SOLN: INTRAVENOUS | Status: AC

## 2022-07-26 MED ORDER — MAGNESIUM SULFATE 2 GM/50ML IV SOLN
2.0000 g | Freq: Once | INTRAVENOUS | Status: AC
  Administered 2022-07-26: 2 g via INTRAVENOUS
  Filled 2022-07-26: qty 50

## 2022-07-26 MED ORDER — ACETAMINOPHEN 650 MG RE SUPP: 650.0000 mg | Freq: Four times a day (QID) | RECTAL | Status: DC | PRN

## 2022-07-26 MED ORDER — PANTOPRAZOLE SODIUM 40 MG PO TBEC
80.0000 mg | DELAYED_RELEASE_TABLET | Freq: Every day | ORAL | Status: DC
  Administered 2022-07-26 – 2022-07-27 (×2): 80 mg via ORAL
  Filled 2022-07-26 (×2): qty 2

## 2022-07-26 MED ORDER — ACETAMINOPHEN 325 MG PO TABS
650.0000 mg | ORAL_TABLET | Freq: Four times a day (QID) | ORAL | Status: DC | PRN
  Administered 2022-07-26: 650 mg via ORAL
  Filled 2022-07-26: qty 2

## 2022-07-26 MED ORDER — ENOXAPARIN SODIUM 40 MG/0.4ML IJ SOSY
40.0000 mg | PREFILLED_SYRINGE | INTRAMUSCULAR | Status: DC
  Administered 2022-07-27: 40 mg via SUBCUTANEOUS
  Filled 2022-07-26: qty 0.4

## 2022-07-26 MED ORDER — POTASSIUM CHLORIDE CRYS ER 20 MEQ PO TBCR
40.0000 meq | EXTENDED_RELEASE_TABLET | Freq: Once | ORAL | Status: AC
  Administered 2022-07-26: 40 meq via ORAL
  Filled 2022-07-26: qty 2

## 2022-07-26 NOTE — Plan of Care (Signed)
  Problem: Acute Rehab OT Goals (only OT should resolve) Goal: Pt. Will Perform Grooming Flowsheets (Taken 07/26/2022 0922) Pt Will Perform Grooming:  with modified independence  standing Goal: Pt. Will Perform Lower Body Bathing Flowsheets (Taken 07/26/2022 0922) Pt Will Perform Lower Body Bathing:  with modified independence  sit to/from stand  sitting/lateral leans Goal: Pt. Will Perform Lower Body Dressing Flowsheets (Taken 07/26/2022 0922) Pt Will Perform Lower Body Dressing:  with modified independence  sit to/from stand  sitting/lateral leans Goal: Pt. Will Transfer To Toilet Flowsheets (Taken 07/26/2022 [phone]) Pt Will Transfer to Toilet:  with modified independence  ambulating Goal: Pt. Will Perform Toileting-Clothing Manipulation Flowsheets (Taken 07/26/2022 0922) Pt Will Perform Toileting - Clothing Manipulation and hygiene:  with modified independence  sitting/lateral leans  sit to/from stand Goal: Pt/Caregiver Will Perform Home Exercise Program Flowsheets (Taken 07/26/2022 [phone]) Pt/caregiver will Perform Home Exercise Program:  Increased strength  Both right and left upper extremity  Independently  Deo Mehringer OT, MOT

## 2022-07-26 NOTE — Evaluation (Signed)
Physical Therapy Evaluation Patient Details Name: Maureen Vaughn MRN: [id number] DOB: 08/18/1950 Today's Date: 07/26/2022  History of Present Illness  Maureen Vaughn is a 72 y.o. female with medical history significant of GERD, pancreatic insufficiency who presents to the emergency department via EMS from home due to altered mental status.  At bedside, patient was only alert and oriented x 1(self).  History was obtained from granddaughter by phone, per report, patient went to visit her best friend and best friend's daughter called patient's granddaughter Maureen Vaughn) to come to get grandmother since she has taken several pills of Benadryl, BC powder and Valium and the patient has had several falls since she arrived at best friend's place and also that patient's mental status is currently altered.  Granddaughter went to patient's best friend's place and noted that she was very confused and altered, so she activated EMS and patient was taken to the ED for further evaluation and management.   Clinical Impression  Patient unsteady on feet having to lean on armrest of chair for support when transferring without AD, required use of RW for safety, limited to take a few steps in room before having to sit due to c/o fatigue after having BM sitting on commode.  Patient tolerated sitting up in chair with her family member present after therapy.  Patient will benefit from continued skilled physical therapy in hospital and recommended venue below to increase strength, balance, endurance for safe ADLs and gait.         Recommendations for follow up therapy are one component of a multi-disciplinary discharge planning process, led by the attending physician.  Recommendations may be updated based on patient status, additional functional criteria and insurance authorization.  Follow Up Recommendations Can patient physically be transported by private vehicle: Yes     Assistance Recommended at Discharge Set up  Supervision/Assistance  Patient can return home with the following  A lot of help with walking and/or transfers;A little help with bathing/dressing/bathroom;Help with stairs or ramp for entrance;Assistance with cooking/housework    Equipment Recommendations None recommended by PT  Recommendations for Other Services       Functional Status Assessment Patient has had a recent decline in their functional status and demonstrates the ability to make significant improvements in function in a reasonable and predictable amount of time.     Precautions / Restrictions Precautions Precautions: Fall Restrictions Weight Bearing Restrictions: No      Mobility  Bed Mobility Overal bed mobility: Needs Assistance Bed Mobility: Supine to Sit     Supine to sit: Supervision     General bed mobility comments: labored movement    Transfers Overall transfer level: Needs assistance Equipment used: Rolling walker (2 wheels) Transfers: Sit to/from Stand, Bed to chair/wheelchair/BSC Sit to Stand: Min assist, Mod assist   Step pivot transfers: Min assist, Mod assist       General transfer comment: unsteady labored movement having to lean on arm rest of chair for support when not using RW    Ambulation/Gait Ambulation/Gait assistance: Min assist, Mod assist Gait Distance (Feet): 12 Feet Assistive device: Rolling walker (2 wheels) Gait Pattern/deviations: Decreased step length - right, Decreased step length - left, Decreased stride length Gait velocity: decreased     General Gait Details: slow labored cadence limited mostly due to c/o fatigue  Stairs            Wheelchair Mobility    Modified Rankin (Stroke Patients Only)       Balance Overall balance assessment:  Needs assistance Sitting-balance support: Feet supported, No upper extremity supported Sitting balance-Leahy Scale: Fair Sitting balance - Comments: fair/good seated at EOB   Standing balance support: During  functional activity, No upper extremity supported Standing balance-Leahy Scale: Poor Standing balance comment: fair/poor using RW                             Pertinent Vitals/Pain Pain Assessment Pain Assessment: Faces Faces Pain Scale: Hurts a little bit Pain Location: R stomach Pain Descriptors / Indicators: Aching Pain Intervention(s): Limited activity within patient's tolerance, Monitored during session, Repositioned    Home Living Family/patient expects to be discharged to:: Private residence Living Arrangements: Non-relatives/Friends Available Help at Discharge: Personal care attendant;Available 24 hours/day;Other (Comment) Type of Home: House Home Access: Ramped entrance       Home Layout: One level Home Equipment: Agricultural consultant (2 wheels);Rollator (4 wheels);Cane - single point;BSC/3in1;Shower seat;Wheelchair - manual Additional Comments: Care attendant is present 24/7 but limited in ablity to provide physical assist due to her own health issues.    Prior Function Prior Level of Function : Needs assist       Physical Assist : ADLs (physical);Mobility (physical) Mobility (physical): Bed mobility;Transfers;Gait;Stairs ADLs (physical): IADLs Mobility Comments: Community ambulator with SPC. ADLs Comments: Assisted for IADL's; Independent ADL.     Hand Dominance   Dominant Hand: Right    Extremity/Trunk Assessment   Upper Extremity Assessment Upper Extremity Assessment: Defer to OT evaluation    Lower Extremity Assessment Lower Extremity Assessment: Generalized weakness    Cervical / Trunk Assessment Cervical / Trunk Assessment: Kyphotic  Communication   Communication: No difficulties  Cognition Arousal/Alertness: Awake/alert Behavior During Therapy: WFL for tasks assessed/performed Overall Cognitive Status: Within Functional Limits for tasks assessed                                          General Comments       Exercises     Assessment/Plan    PT Assessment Patient needs continued PT services  PT Problem List Decreased strength;Decreased activity tolerance;Decreased balance;Decreased mobility       PT Treatment Interventions DME instruction;Gait training;Stair training;Therapeutic activities;Therapeutic exercise;Balance training;Patient/family education    PT Goals (Current goals can be found in the Care Plan section)  Acute Rehab PT Goals Patient Stated Goal: return home with family to assist PT Goal Formulation: With patient/family Time For Goal Achievement: 08/09/22 Potential to Achieve Goals: Good    Frequency Min 3X/week     Co-evaluation PT/OT/SLP Co-Evaluation/Treatment: Yes Reason for Co-Treatment: To address functional/ADL transfers PT goals addressed during session: Mobility/safety with mobility;Balance;Proper use of DME OT goals addressed during session: ADL's and self-care       AM-PAC PT "6 Clicks" Mobility  Outcome Measure Help needed turning from your back to your side while in a flat bed without using bedrails?: None Help needed moving from lying on your back to sitting on the side of a flat bed without using bedrails?: A Little Help needed moving to and from a bed to a chair (including a wheelchair)?: A Lot Help needed standing up from a chair using your arms (e.g., wheelchair or bedside chair)?: A Little Help needed to walk in hospital room?: A Lot Help needed climbing 3-5 steps with a railing? : A Lot 6 Click Score: 16    End of  Session   Activity Tolerance: Patient tolerated treatment well;Patient limited by fatigue Patient left: in chair;with call bell/phone within reach Nurse Communication: Mobility status PT Visit Diagnosis: Unsteadiness on feet (R26.81);Other abnormalities of gait and mobility (R26.89);Muscle weakness (generalized) (M62.81)    Time: 1610-9604 PT Time Calculation (min) (ACUTE ONLY): 25 min   Charges:   PT Evaluation $PT Eval  Moderate Complexity: 1 Mod PT Treatments $Therapeutic Activity: 23-37 mins        12:30 PM, 07/26/22 Ocie Bob, MPT Physical Therapist with [facility] [phone] office [phone] mobile phone

## 2022-07-26 NOTE — TOC Initial Note (Signed)
Transition of Care [facility]) - Initial/Assessment Note    Patient Details  Name: Maureen Vaughn MRN: [id number] Date of Birth: 10-Sep-1950  Transition of Care [facility]) CM/SW Contact:    Elliot Gault, LCSW Phone Number: 07/26/2022, 12:36 PM  Clinical Narrative:                  Pt admitted from home. Received TOC consult for SA treatment resources. PT recommending SNF rehab.   Met with pt and granddaughter at bedside to assess and review dc planning. Pt resides at home with a paid caretaker. Pt states she has a cane for ambulation. She is able to get to appointments and obtain meds as needed.  Initially pt agreeable to [facility] and then [facility] informed by MD that pt agreeable to SNF. Discussed again with pt and granddaughter. CMS Provider options reviewed. Will refer as requested and initiate insurance auth.  Discussed SA treatment resource information and pt agreeable to receiving written list of options with plan to follow up with the assistance of her caretaker. Will provide this information to pt prior to dc.  Will follow.  Expected Discharge Plan: Skilled Nursing Facility Barriers to Discharge: SNF Pending bed offer   Patient Goals and CMS Choice Patient states their goals for this hospitalization and ongoing recovery are:: rehab CMS Medicare.gov Compare Post Acute Care list provided to:: Patient Choice offered to / list presented to : Patient [place] ownership interest in [facility].provided to:: Patient    Expected Discharge Plan and Services In-house Referral: Clinical Social Work   Post Acute Care Choice: Skilled Nursing Facility Living arrangements for the past 2 months: Single Family Home Expected Discharge Date: 07/26/22                         HH Arranged: PT, OT HH Agency: [facility] Date [facility] Agency Contacted: 07/26/22   Representative spoke with at [facility] Agency: Kandee Keen  Prior Living Arrangements/Services Living arrangements for the past 2 months:  Single Family Home Lives with:: Other (Comment) (care taker) Patient language and need for interpreter reviewed:: Yes Do you feel safe going back to the place where you live?: Yes      Need for Family Participation in Patient Care: Yes (Comment) Care giver support system in place?: Yes (comment) Current home services: DME Criminal Activity/Legal Involvement Pertinent to Current Situation/Hospitalization: No - Comment as needed  Activities of Daily Living Home Assistive Devices/Equipment: None ADL Screening (condition at time of admission) Patient's cognitive ability adequate to safely complete daily activities?: No Is the patient deaf or have difficulty hearing?: No Does the patient have difficulty seeing, even when wearing glasses/contacts?: No Does the patient have difficulty concentrating, remembering, or making decisions?: Yes Patient able to express need for assistance with ADLs?: Yes Does the patient have difficulty dressing or bathing?: Yes Independently performs ADLs?: No Communication: Independent Dressing (OT): Needs assistance Is this a change from baseline?: Pre-admission baseline Grooming: Needs assistance Is this a change from baseline?: Pre-admission baseline Feeding: Independent Bathing: Needs assistance Is this a change from baseline?: Pre-admission baseline Toileting: Independent In/Out Bed: Needs assistance Is this a change from baseline?: Pre-admission baseline Walks in Home: Independent Does the patient have difficulty walking or climbing stairs?: No Weakness of Legs: None Weakness of Arms/Hands: None  Permission Sought/Granted Permission sought to share information with : Facility Industrial/product designer granted to share information with : Yes, Verbal Permission Granted     Permission  granted to share info w AGENCY: [facility]        Emotional Assessment Appearance:: Appears stated age Attitude/Demeanor/Rapport: Engaged Affect (typically  observed): Pleasant Orientation: : Oriented to Self, Oriented to Place, Oriented to  Time, Oriented to Situation Alcohol / Substance Use: Other (comment) (prescription drugs) Psych Involvement: No (comment)  Admission diagnosis:  Drug overdose, multiple drugs [T50.911A] Patient Active Problem List   Diagnosis Date Noted   Acute metabolic encephalopathy 07/26/2022   Multiple falls 07/26/2022   AKI (acute kidney injury) (HCC) 07/26/2022   Hypoglycemia 07/26/2022   Leukocytosis 07/26/2022   Elevated CK 07/26/2022   Elevated troponin 07/26/2022   GERD (gastroesophageal reflux disease) 07/26/2022   Mixed hyperlipidemia 07/26/2022   Pancreatic insufficiency 07/26/2022   Drug overdose, multiple drugs 07/25/2022   Adenocarcinoma of pancreas (HCC) 12/23/2015   PCP:  Richardean Chimera, MD Pharmacy:   [facility] - [place], [place] - [address] [place] [address] Phone: [phone] Fax: [phone]  [facility] - [place], [place] - [address] [place] [address] Phone: [phone] Fax: [phone]     Social Determinants of Health (SDOH) Social History: SDOH Screenings   Food Insecurity: Patient Declined (07/25/2022)  Housing: Patient Declined (07/25/2022)  Transportation Needs: Patient Declined (07/25/2022)  Utilities: Patient Declined (07/25/2022)  Tobacco Use: Medium Risk (02/14/2020)   SDOH Interventions:     Readmission Risk Interventions     No data to display

## 2022-07-26 NOTE — Evaluation (Signed)
Occupational Therapy Evaluation Patient Details Name: Maureen Vaughn MRN: [id number] DOB: 03-31-1950 Today's Date: 07/26/2022   History of Present Illness Maureen Vaughn is a 72 y.o. female with medical history significant of GERD, pancreatic insufficiency who presents to the emergency department via EMS from home due to altered mental status.  At bedside, patient was only alert and oriented x 1(self).  History was obtained from granddaughter by phone, per report, patient went to visit her best friend and best friend's daughter called patient's granddaughter Maureen Vaughn) to come to get grandmother since she has taken several pills of Benadryl, BC powder and Valium and the patient has had several falls since she arrived at best friend's place and also that patient's mental status is currently altered.  Granddaughter went to patient's best friend's place and noted that she was very confused and altered, so she activated EMS and patient was taken to the ED for further evaluation and management. (per DO)   Clinical Impression   Pt agreeable to OT and PT co-evaluation. Pt is independent for ADL's at baseline with some assist for IADL's. Pt is unsteady in standing requiring min to mod A for transfers with use or RW, which the pt does not typically use. Pt typically uses a cane for ambulation. Pt is weak with poor endurance at this time. Assist needed for toileting and any standing tasks. Pt has a care attendant at home, but that individual is unable to safely provide the physical assist the pt is needing today. Pt left in the chair with family and nurse present. Pt will benefit from continued OT in the hospital and recommended venue below to increase strength, balance, and endurance for safe ADL's.         Recommendations for follow up therapy are one component of a multi-disciplinary discharge planning process, led by the attending physician.  Recommendations may be updated based on patient status, additional  functional criteria and insurance authorization.   Assistance Recommended at Discharge Intermittent Supervision/Assistance  Patient can return home with the following A lot of help with walking and/or transfers;A little help with bathing/dressing/bathroom;Assistance with cooking/housework;Assist for transportation;Help with stairs or ramp for entrance    Functional Status Assessment  Patient has had a recent decline in their functional status and demonstrates the ability to make significant improvements in function in a reasonable and predictable amount of time.  Equipment Recommendations  None recommended by OT    Recommendations for Other Services       Precautions / Restrictions Precautions Precautions: Fall Restrictions Weight Bearing Restrictions: No      Mobility Bed Mobility Overal bed mobility: Needs Assistance Bed Mobility: Supine to Sit     Supine to sit: Supervision     General bed mobility comments: Mild labored effort.    Transfers Overall transfer level: Needs assistance Equipment used: Rolling walker (2 wheels) Transfers: Sit to/from Stand, Bed to chair/wheelchair/BSC Sit to Stand: Min assist, Mod assist     Step pivot transfers: Min assist, Mod assist     General transfer comment: Min to mod A with and without RW; more unsteady without RW.      Balance Overall balance assessment: Needs assistance Sitting-balance support: Feet supported, No upper extremity supported Sitting balance-Leahy Scale: Good Sitting balance - Comments: fair to good at EOB   Standing balance support: Bilateral upper extremity supported, During functional activity Standing balance-Leahy Scale: Fair Standing balance comment: poor to fair with RW  ADL either performed or assessed with clinical judgement   ADL Overall ADL's : Needs assistance/impaired     Grooming: Min guard;Minimal assistance;Standing   Upper Body Bathing: Set  up;Min guard;Sitting   Lower Body Bathing: Min guard;Set up;Sitting/lateral leans   Upper Body Dressing : Min guard;Sitting;Set up   Lower Body Dressing: Set up;Min guard;Sitting/lateral leans Lower Body Dressing Details (indicate cue type and reason): Pt able to don R sock seated at EOB Toilet Transfer: Minimal assistance;Moderate assistance;Ambulation;Rolling walker (2 wheels) Toilet Transfer Details (indicate cue type and reason): Chair to toilet and back with RW. Toileting- Clothing Manipulation and Hygiene: Minimal assistance;Moderate assistance;Sit to/from stand Toileting - Clothing Manipulation Details (indicate cue type and reason): Pt was able to wipe herself but required assist while standing to thoroughly clean the area. Able to pull down her undergarments with min G assist.     Functional mobility during ADLs: Minimal assistance;Moderate assistance;Rolling walker (2 wheels) General ADL Comments: Pt ambualted to the toilet and back to the chair.     Vision Baseline Vision/History: 0 No visual deficits Ability to See in Adequate Light: 1 Impaired Patient Visual Report: Blurring of vision;Other (comment) (Pt reports intermittent blurry vision since 2015.) Additional Comments: Vision reported to be a little fuzzy which has been normal for the pt for some time.                Pertinent Vitals/Pain Pain Assessment Pain Assessment: Faces Faces Pain Scale: Hurts a little bit Pain Location: R stomach Pain Descriptors / Indicators: Other (Comment) ("Comes and goes") Pain Intervention(s): Limited activity within patient's tolerance, Monitored during session, Repositioned     Hand Dominance Right   Extremity/Trunk Assessment Upper Extremity Assessment Upper Extremity Assessment: Generalized weakness   Lower Extremity Assessment Lower Extremity Assessment: Defer to PT evaluation   Cervical / Trunk Assessment Cervical / Trunk Assessment: Kyphotic   Communication  Communication Communication: No difficulties   Cognition Arousal/Alertness: Awake/alert Behavior During Therapy: WFL for tasks assessed/performed Overall Cognitive Status: Within Functional Limits for tasks assessed                                                        Home Living Family/patient expects to be discharged to:: Private residence Living Arrangements: Non-relatives/Friends Available Help at Discharge: Personal care attendant;Available 24 hours/day;Other (Comment) (Not able to provide much physical assist to pt.) Type of Home: House Home Access: Ramped entrance     Home Layout: One level     Bathroom Shower/Tub: Producer, television/film/video: Standard Bathroom Accessibility: Yes How Accessible: Accessible via walker Home Equipment: Rolling Walker (2 wheels);Rollator (4 wheels);Cane - single point;BSC/3in1;Shower seat;Wheelchair - manual   Additional Comments: Care attendant is present 24/7 but limited in ablity to provide physical assist due to her own health issues.      Prior Functioning/Environment Prior Level of Function : Needs assist       Physical Assist : ADLs (physical)   ADLs (physical): IADLs Mobility Comments: Community ambulator with SPC. ADLs Comments: Assisted for IADL's; Independent ADL.        OT Problem List: Decreased strength;Decreased activity tolerance;Impaired balance (sitting and/or standing)      OT Treatment/Interventions: Self-care/ADL training;Therapeutic exercise;Patient/family education;Balance training;DME and/or AE instruction;Therapeutic activities    OT Goals(Current goals can be found in the care  plan section) Acute Rehab OT Goals Patient Stated Goal: Get stronger prior to return home. OT Goal Formulation: With patient Time For Goal Achievement: 08/09/22 Potential to Achieve Goals: Good  OT Frequency: Min 2X/week    Co-evaluation PT/OT/SLP Co-Evaluation/Treatment: Yes Reason for  Co-Treatment: To address functional/ADL transfers   OT goals addressed during session: ADL's and self-care      AM-PAC OT "6 Clicks" Daily Activity     Outcome Measure Help from another person eating meals?: None Help from another person taking care of personal grooming?: A Little Help from another person toileting, which includes using toliet, bedpan, or urinal?: A Lot Help from another person bathing (including washing, rinsing, drying)?: A Little Help from another person to put on and taking off regular upper body clothing?: A Little Help from another person to put on and taking off regular lower body clothing?: A Little 6 Click Score: 18   End of Session Equipment Utilized During Treatment: Rolling walker (2 wheels) Nurse Communication: Other (comment) (Observed mobility.)  Activity Tolerance: Patient tolerated treatment well Patient left: in chair;with call bell/phone within reach;with family/visitor present  OT Visit Diagnosis: Unsteadiness on feet (R26.81);Other abnormalities of gait and mobility (R26.89);Muscle weakness (generalized) (M62.81);History of falling (Z91.81)                Time: 1610-9604 OT Time Calculation (min): 20 min Charges:  OT General Charges $OT Visit: 1 Visit OT Evaluation $OT Eval Low Complexity: 1 Low  Annibelle Brazie OT, MOT  Danie Chandler 07/26/2022, 9:19 AM

## 2022-07-26 NOTE — Progress Notes (Signed)
PROGRESS NOTE     Maureen Vaughn, is a 72 y.o. female, DOB - 05-21-1950, ZOX:[id number]  Admit date - 07/25/2022   Admitting Physician Maureen Shown, DO  Outpatient Primary MD for the patient is Maureen Chimera, MD  LOS - 0  Chief Complaint  Patient presents with   Drug Overdose        Brief Narrative:  72 y.o. female with medical history significant of GERD, history of pancreatic adenocarcinoma with pancreatic insufficiency , HLD admitted on 07/25/2022 with acute metabolic encephalopathy in setting of ingestion of benadryl, BC powders and other OTC agents--    -Assessment and Plan: 1) acute metabolic encephalopathy--due to hypoglycemia and ingestion of benadryl, BC powders and other OTC agents-- and valium -UDS positive for benzo, patient has prescription for Valium -CT head and CTA chest without acute findings --apparently patient likes to take medications to get high -Patient and granddaughter Maureen Vaughn deny suicidal or homicidal ideation or plan -Poison control input appreciated -Mentation improving significantly over the last 12 hours.  May  2) recurrent falls--- due to #1 above troponin was 176 > 258 > 265 > 271, total CK 235.  -Physical therapy eval appreciated recommends home health versus SNF rehab  3) possible UTI--IV Rocephin pending culture  4)Acute kidney injury BUN/creatinine 29/1.68 (baseline creatinine at 1.0). Continue gentle hydration Renally adjust medications, avoid nephrotoxic agents/dehydration/hypotension  5) history of pancreatic malignancy with pancreatic insufficiency--- continue Creon with meals  Social/ethics--plan of care discussed with patient and patient's granddaughter [name]--- questions answered -Patient remains a full code   Status is: Inpatient   Disposition: The patient is from: Home              Anticipated d/c is to:  home with [facility] Vs SNF rehab              Anticipated d/c date is: 2 days              Patient  currently is not medically stable to d/c. Barriers: Not Clinically Stable-   Code Status :  -  Code Status: Full Code   Family Communication:    (patient is alert, awake and coherent)  Discussed with Granddaughter Mrs Maureen Vaughn  DVT Prophylaxis  :   - SCDs   enoxaparin (LOVENOX) injection 40 mg Start: 07/27/22 1000 SCDs Start: 07/26/22 0058   Lab Results  Component Value Date   PLT 229 07/26/2022    Inpatient Medications  Scheduled Meds:  atorvastatin  20 mg Oral QHS   Chlorhexidine Gluconate Cloth  6 each Topical Daily   [START ON 07/27/2022] enoxaparin (LOVENOX) injection  40 mg Subcutaneous Q24H   feeding supplement  237 mL Oral BID BM   folic acid  1 mg Oral Daily   lipase/protease/amylase  36,000 Units Oral TID WC   multivitamin with minerals  1 tablet Oral Daily   pantoprazole  80 mg Oral Daily   thiamine  100 mg Oral Daily   Or   thiamine  100 mg Intravenous Daily   Continuous Infusions: PRN Meds:.acetaminophen **OR** acetaminophen, ondansetron **OR** ondansetron (ZOFRAN) IV, mouth rinse   Anti-infectives (From admission, onward)    Start     Dose/Rate Route Frequency Ordered Stop   07/25/22 2130  cefTRIAXone (ROCEPHIN) 1 g in sodium chloride 0.9 % 100 mL IVPB        1 g 200 mL/hr over 30 Minutes Intravenous  Once 07/25/22 2115 07/25/22 2220  Subjective: Maureen Vaughn today has no fevers, no emesis,  No chest pain,  Patient becoming more awake, oral intake is fair -Granddaughter Maureen Vaughn is at bedside... Questions answered   Objective: Vitals:   07/26/22 1500 07/26/22 1600 07/26/22 1627 07/26/22 1817  BP:    101/85  Pulse: 82 74  88  Resp: (!) 24 (!) 26  19  Temp:   99.7 F (37.6 C) 98.7 F (37.1 C)  TempSrc:   Oral Oral  SpO2: 100% 96%  90%  Weight:    50.8 kg  Height:    5' (1.524 m)    Intake/Output Summary (Last 24 hours) at 07/26/2022 1832 Last data filed at 07/26/2022 1448 Gross per 24 hour  Intake 2163.23 ml  Output --  Net  2163.23 ml   Filed Weights   07/25/22 2245 07/26/22 1817  Weight: 52.1 kg 50.8 kg    Physical Exam  Gen:- Awake Alert,  in no apparent distress  HEENT:- [place].AT, No sclera icterus Neck-Supple Neck,No JVD,.  Lungs-  CTAB , fair symmetrical air movement CV- S1, S2 normal, regular  Abd-  +ve B.Sounds, Abd Soft, No tenderness,    Extremity/Skin:- No  edema, pedal pulses present  Psych-affect is appropriate, oriented x3 Neuro-generalized weakness, no new focal deficits, no tremors  Data Reviewed: I have personally reviewed following labs and imaging studies  CBC: Recent Labs  Lab 07/25/22 1415 07/26/22 0333  WBC 14.2* 15.2*  NEUTROABS 11.9*  --   HGB 11.1* 10.8*  HCT 34.3* 32.9*  MCV 87.1 86.6  PLT 205 229   Basic Metabolic Panel: Recent Labs  Lab 07/25/22 1415 07/25/22 1858 07/26/22 0333  NA 139 135 138  K 4.9 4.1 3.4*  CL 103 103 107  CO2 22 22 20*  GLUCOSE 67* 103* 86  BUN 29* 27* 23  CREATININE 1.68* 1.37* 1.26*  CALCIUM 9.7 9.0 9.0  MG 1.7  --  1.4*  PHOS  --   --  3.2   GFR: Estimated Creatinine Clearance: 29.4 mL/min (A) (by C-G formula based on SCr of 1.26 mg/dL (H)). Liver Function Tests: Recent Labs  Lab 07/25/22 1415 07/25/22 1858 07/26/22 0333  AST 34 37 34  ALT 27 26 24   ALKPHOS 79 67 67  BILITOT 1.5* 1.1 1.3*  PROT 7.2 6.4* 6.0*  ALBUMIN 4.3 3.7 3.4*   Cardiac Enzymes: Recent Labs  Lab 07/25/22 1858  CKTOTAL 235*   Recent Results (from the past 240 hour(s))  Urine Culture     Status: Abnormal (Preliminary result)   Collection Time: 07/25/22  4:00 PM   Specimen: Urine, Random  Result Value Ref Range Status   Specimen Description   Final    URINE, RANDOM Performed at [facility], [address] [place], [place] [address]    Special Requests   Final    NONE Reflexed from [id number] Performed at [facility], [address] [place], [place] [address]    Culture (A)  Final    >=100,000 COLONIES/mL GRAM NEGATIVE RODS IDENTIFICATION  AND SUSCEPTIBILITIES TO FOLLOW Performed at [facility], [address], [place], [place] [address]    Report Status PENDING  Incomplete  MRSA Next Gen by PCR, Nasal     Status: None   Collection Time: 07/25/22 10:26 PM   Specimen: Nasal Mucosa; Nasal Swab  Result Value Ref Range Status   MRSA by PCR Next Gen NOT DETECTED NOT DETECTED Final    Comment: (NOTE) The GeneXpert MRSA Assay (FDA  approved for NASAL specimens only), is one component of a comprehensive MRSA colonization surveillance program. It is not intended to diagnose MRSA infection nor to guide or monitor treatment for MRSA infections. Test performance is not FDA approved in patients less than 10 years old. Performed at [facility], [address], [place], [place] [address]    Radiology Studies: CT Angio Chest PE W and/or Wo Contrast  Result Date: 07/25/2022 CLINICAL DATA:  Altered mental status, high clinical suspicion for PE EXAM: CT ANGIOGRAPHY CHEST WITH CONTRAST TECHNIQUE: Multidetector CT imaging of the chest was performed using the standard protocol during bolus administration of intravenous contrast. Multiplanar CT image reconstructions and MIPs were obtained to evaluate the vascular anatomy. RADIATION DOSE REDUCTION: This exam was performed according to the departmental dose-optimization program which includes automated exposure control, adjustment of the mA and/or kV according to patient size and/or use of iterative reconstruction technique. CONTRAST:  60mL OMNIPAQUE IOHEXOL 350 MG/ML SOLN COMPARISON:  Chest radiographs done earlier today FINDINGS: Cardiovascular: There are no intraluminal filling defects in central pulmonary artery branches. Motion artifacts limit evaluation of small peripheral pulmonary artery branches. There is homogeneous enhancement in thoracic aorta. There are scattered calcifications and atherosclerotic plaques in thoracic aorta and its major branches. Coronary artery calcifications are seen.  Mediastinum/Nodes: No significant lymphadenopathy is seen. Lungs/Pleura: There are small linear densities in the posterior lower lung fields, more so on the left side. There is prominent epicardial fat pad at left cardiophrenic angle which may account for increased density at the left costophrenic angle seen in the chest radiographs. Upper Abdomen: Gallbladder is not seen. Air is noted in bile duct branches, possibly suggesting previous sphincterotomy. Small hiatal hernia is seen. Musculoskeletal: No acute findings are seen. There is encroachment of neural foramina by bony spurs in lower cervical spine. Review of the MIP images confirms the above findings. IMPRESSION: There is no evidence of pulmonary artery embolism. There is no evidence of thoracic aortic dissection. Aortic arteriosclerosis. Coronary artery calcifications are seen. Small linear densities are seen in the posterior lower lung fields suggesting scarring or subsegmental atelectasis. There is no focal pulmonary consolidation. There is prominence of epicardial fat pad at the left cardiophrenic angle which may account for blunting of left lateral CP angle seen in the chest radiographs. There is no pleural effusion or pneumothorax. Small hiatal hernia. Air in the bile ducts may be due to previous sphincterotomy. Electronically Signed   By: Ernie Avena M.D.   On: 07/25/2022 18:45   CT Head Wo Contrast  Result Date: 07/25/2022 CLINICAL DATA:  Altered mental status. EXAM: CT HEAD WITHOUT CONTRAST TECHNIQUE: Contiguous axial images were obtained from the base of the skull through the vertex without intravenous contrast. RADIATION DOSE REDUCTION: This exam was performed according to the departmental dose-optimization program which includes automated exposure control, adjustment of the mA and/or kV according to patient size and/or use of iterative reconstruction technique. COMPARISON:  06/06/2008 FINDINGS: Brain: No evidence of intracranial  hemorrhage, acute infarction, hydrocephalus, extra-axial collection, or mass lesion/mass effect. 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 Mild cerebral atrophy and chronic small vessel disease also noted. Vascular:  No hyperdense vessel or other acute findings. Skull: No evidence of fracture or other significant bone abnormality. Sinuses/Orbits:  No acute findings. Other: None. IMPRESSION: No acute intracranial abnormality. 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 Mild cerebral atrophy and chronic small vessel disease. Electronically Signed   By: Dietrich Pates.D.  On: 07/25/2022 15:21   DG Chest Port 1 View  Result Date: 07/25/2022 CLINICAL DATA:  Shortness of breath EXAM: PORTABLE CHEST 1 VIEW COMPARISON:  Chest radiograph 06/06/2008 FINDINGS: Low lung volume exam. Stable cardiac and mediastinal contours. Heterogeneous opacities left lung base. No pleural effusion or pneumothorax. Thoracic spine degenerative changes. IMPRESSION: Heterogeneous opacities left lung base may represent atelectasis or infection. Electronically Signed   By: Annia Belt M.D.   On: 07/25/2022 15:14     Scheduled Meds:  atorvastatin  20 mg Oral QHS   Chlorhexidine Gluconate Cloth  6 each Topical Daily   [START ON 07/27/2022] enoxaparin (LOVENOX) injection  40 mg Subcutaneous Q24H   feeding supplement  237 mL Oral BID BM   folic acid  1 mg Oral Daily   lipase/protease/amylase  36,000 Units Oral TID WC   multivitamin with minerals  1 tablet Oral Daily   pantoprazole  80 mg Oral Daily   thiamine  100 mg Oral Daily   Or   thiamine  100 mg Intravenous Daily   Continuous Infusions:   LOS: 0 days    Shon Hale M.D on 07/26/2022 at 6:32 PM  Go to [email or web address] - for contact info  [facility] - Office  [phone]  If 7PM-7AM, please contact night-coverage [email or web address] 07/26/2022, 6:32 PM

## 2022-07-26 NOTE — NC FL2 (Signed)
[place] MEDICAID FL2 LEVEL OF CARE FORM     IDENTIFICATION  Patient Name: Maureen Vaughn Birthdate: 1950/06/08 Sex: female Admission Date (Current Location): 07/25/2022  [facility] and [place] Number:  [employer] and Address:  [facility],  [address], [place] [address]      Provider Number: [id number]  Attending Physician Name and Address:  Shon Hale, MD  Relative Name and Phone Number:       Current Level of Care: Hospital Recommended Level of Care: Skilled Nursing Facility Prior Approval Number:    Date Approved/Denied:   PASRR Number: [id number]  Discharge Plan: SNF    Current Diagnoses: Patient Active Problem List   Diagnosis Date Noted   Acute metabolic encephalopathy 07/26/2022   Multiple falls 07/26/2022   AKI (acute kidney injury) (HCC) 07/26/2022   Hypoglycemia 07/26/2022   Leukocytosis 07/26/2022   Elevated CK 07/26/2022   Elevated troponin 07/26/2022   GERD (gastroesophageal reflux disease) 07/26/2022   Mixed hyperlipidemia 07/26/2022   Pancreatic insufficiency 07/26/2022   Drug overdose, multiple drugs 07/25/2022   Adenocarcinoma of pancreas (HCC) 12/23/2015    Orientation RESPIRATION BLADDER Height & Weight     Self, Time, Situation, Place  Normal Continent Weight: 114 lb 13.8 oz (52.1 kg) Height:  5\' 2"  (157.5 cm)  BEHAVIORAL SYMPTOMS/MOOD NEUROLOGICAL BOWEL NUTRITION STATUS      Continent Diet (see dc summary)  AMBULATORY STATUS COMMUNICATION OF NEEDS Skin   Extensive Assist Verbally Normal                       Personal Care Assistance Level of Assistance  Bathing, Feeding, Dressing Bathing Assistance: Limited assistance Feeding assistance: Independent Dressing Assistance: Limited assistance     Functional Limitations Info  Sight, Hearing, Speech Sight Info: Adequate Hearing Info: Adequate Speech Info: Adequate    SPECIAL CARE FACTORS FREQUENCY  PT (By licensed PT), OT (By licensed OT)      PT Frequency: 5x week OT Frequency: 3x week            Contractures Contractures Info: Not present    Additional Factors Info  Code Status, Allergies Code Status Info: Full Allergies Info: NKA           Current Medications (07/26/2022):  This is the current hospital active medication list Current Facility-Administered Medications  Medication Dose Route Frequency Provider Last Rate Last Admin   acetaminophen (TYLENOL) tablet 650 mg  650 mg Oral Q6H PRN Adefeso, Oladapo, DO       Or   acetaminophen (TYLENOL) suppository 650 mg  650 mg Rectal Q6H PRN Adefeso, Oladapo, DO       atorvastatin (LIPITOR) tablet 20 mg  20 mg Oral QHS Adefeso, Oladapo, DO   20 mg at 07/26/22 0126   Chlorhexidine Gluconate Cloth 2 % PADS 6 each  6 each Topical Daily Adefeso, Oladapo, DO   6 each at 07/25/22 2243   [START ON 07/27/2022] enoxaparin (LOVENOX) injection 40 mg  40 mg Subcutaneous Q24H Emokpae, Courage, MD       feeding supplement (ENSURE ENLIVE / ENSURE PLUS) liquid 237 mL  237 mL Oral BID BM Adefeso, Oladapo, DO       folic acid (FOLVITE) tablet 1 mg  1 mg Oral Daily Adefeso, Oladapo, DO   1 mg at 07/26/22 1146   lipase/protease/amylase (CREON) capsule 36,000 Units  36,000 Units Oral TID WC Adefeso, Oladapo, DO   36,000 Units at 07/26/22 1146   multivitamin  with minerals tablet 1 tablet  1 tablet Oral Daily Adefeso, Oladapo, DO   1 tablet at 07/26/22 1146   ondansetron (ZOFRAN) tablet 4 mg  4 mg Oral Q6H PRN Adefeso, Oladapo, DO       Or   ondansetron (ZOFRAN) injection 4 mg  4 mg Intravenous Q6H PRN Adefeso, Oladapo, DO   4 mg at 07/26/22 1610   Oral care mouth rinse  15 mL Mouth Rinse PRN Adefeso, Oladapo, DO       pantoprazole (PROTONIX) EC tablet 80 mg  80 mg Oral Daily Adefeso, Oladapo, DO   80 mg at 07/26/22 1146   potassium chloride SA (KLOR-CON M) CR tablet 40 mEq  40 mEq Oral Once Emokpae, Courage, MD       thiamine (VITAMIN B1) tablet 100 mg  100 mg Oral Daily Adefeso, Oladapo, DO        Or   thiamine (VITAMIN B1) injection 100 mg  100 mg Intravenous Daily Adefeso, Oladapo, DO   100 mg at 07/26/22 1147     Discharge Medications: Please see discharge summary for a list of discharge medications.  Relevant Imaging Results:  Relevant Lab Results:   Additional Information SSN: [id number] LCSW

## 2022-07-26 NOTE — Plan of Care (Signed)
  Problem: Acute Rehab PT Goals(only PT should resolve) Goal: Pt Will Go Supine/Side To Sit Outcome: Progressing Flowsheets (Taken 07/26/2022 1230) Pt will go Supine/Side to Sit:  with modified independence  with supervision Goal: Patient Will Transfer Sit To/From Stand Outcome: Progressing Flowsheets (Taken 07/26/2022 1230) Patient will transfer sit to/from stand:  with supervision  with min guard assist Goal: Pt Will Transfer Bed To Chair/Chair To Bed Outcome: Progressing Flowsheets (Taken 07/26/2022 1230) Pt will Transfer Bed to Chair/Chair to Bed:  with supervision  min guard assist Goal: Pt Will Ambulate Outcome: Progressing Flowsheets (Taken 07/26/2022 1230) Pt will Ambulate:  50 feet  with min guard assist  with rolling walker  with cane   12:31 PM, 07/26/22 Ocie Bob, MPT Physical Therapist with [facility] [phone] office [phone] mobile phone

## 2022-07-27 DIAGNOSIS — G9341 Metabolic encephalopathy: Secondary | ICD-10-CM | POA: Diagnosis not present

## 2022-07-27 DIAGNOSIS — T50911A Poisoning by multiple unspecified drugs, medicaments and biological substances, accidental (unintentional), initial encounter: Secondary | ICD-10-CM | POA: Diagnosis not present

## 2022-07-27 LAB — URINE CULTURE: Culture: >=100000 — AB

## 2022-07-27 LAB — GLUCOSE, CAPILLARY: Glucose-Capillary: 88 mg/dL (ref 70–99)

## 2022-07-27 MED ORDER — POTASSIUM CHLORIDE ER 10 MEQ PO TBCR: 10.0000 meq | EXTENDED_RELEASE_TABLET | Freq: Every day | ORAL | 1 refills | Status: DC

## 2022-07-27 MED ORDER — OMEPRAZOLE 40 MG PO CPDR
40.0000 mg | DELAYED_RELEASE_CAPSULE | Freq: Every day | ORAL | 3 refills | Status: DC
Start: 2022-07-27 — End: 2023-05-09

## 2022-07-27 MED ORDER — DULOXETINE HCL 60 MG PO CPEP: 60.0000 mg | ORAL_CAPSULE | Freq: Every day | ORAL | 3 refills | Status: DC

## 2022-07-27 MED ORDER — ATORVASTATIN CALCIUM 20 MG PO TABS: 20.0000 mg | ORAL_TABLET | Freq: Every day | ORAL | 2 refills | Status: DC

## 2022-07-27 MED ORDER — METOPROLOL SUCCINATE ER 25 MG PO TB24: 12.5000 mg | ORAL_TABLET | Freq: Every day | ORAL | 3 refills | Status: DC

## 2022-07-27 MED ORDER — PANCRELIPASE (LIP-PROT-AMYL) 36000-114000 UNITS PO CPEP
36000.0000 [IU] | ORAL_CAPSULE | Freq: Three times a day (TID) | ORAL | 6 refills | Status: DC
Start: 2022-07-27 — End: 2023-05-09

## 2022-07-27 NOTE — TOC Transition Note (Signed)
Transition of Care [facility]) - CM/SW Discharge Note   Patient Details  Name: Maureen Vaughn MRN: [id number] Date of Birth: 04/26/1950  Transition of Care [facility]) CM/SW Contact:  Karn Cassis, LCSW Phone Number: 07/27/2022, 10:02 AM   Clinical Narrative:  Pt d/c today. LCSW confirmed with pt plan to go home with granddaughter and have home health. Cory with [facility] notified of d/c and HHPT order in. RN updated.      Final next level of care: Home w Home Health Services Barriers to Discharge: Barriers Resolved   Patient Goals and CMS Choice CMS Medicare.gov Compare Post Acute Care list provided to:: Patient Choice offered to / list presented to : Patient  Discharge Placement                      Patient and family notified of of transfer: 07/27/22  Discharge Plan and Services Additional resources added to the After Visit Summary for   In-house Referral: Clinical Social Work   Post Acute Care Choice: Skilled Nursing Facility                    HH Arranged: PT [facility] Agency: [facility] Date [facility] Agency Contacted: 07/27/22 Time HH Agency Contacted: 1001 Representative spoke with at [facility] Agency: Kandee Keen  Social Determinants of Health (SDOH) Interventions SDOH Screenings   Food Insecurity: Patient Declined (07/25/2022)  Housing: Patient Declined (07/25/2022)  Transportation Needs: Patient Declined (07/25/2022)  Utilities: Patient Declined (07/25/2022)  Tobacco Use: Medium Risk (02/14/2020)     Readmission Risk Interventions     No data to display

## 2022-07-27 NOTE — Care Management Obs Status (Signed)
MEDICARE OBSERVATION STATUS NOTIFICATION   Patient Details  Name: Remiyah Duey MRN: [id number] Date of Birth: 1950/02/09   Medicare Observation Status Notification Given:  Yes    Corey Harold 07/27/2022, 8:32 AM

## 2022-07-27 NOTE — Discharge Summary (Signed)
Maureen Vaughn, is a 72 y.o. female  DOB 1950-09-22  MRN [id number].  Admission date:  07/25/2022  Admitting Physician  Frankey Shown, DO  Discharge Date:  07/27/2022   Primary MD  Richardean Chimera, MD  Recommendations for primary care physician for things to follow:   1) please take medications only as prescribed 2) avoid taking excessive amounts of over-the-counter medications 3)follow up with care physician Dr. 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 female with medical history significant of GERD, pancreatic insufficiency who presents to the emergency department via EMS from home due to altered mental status.  At bedside, patient was only alert and oriented x 1(self).  History was obtained from granddaughter by phone, per report, patient went to visit her best friend and best friend's daughter called patient's granddaughter [name])  to come to get grandmother since she has taken several pills of Benadryl, BC powder and Valium and the patient has had several falls since she arrived at best friend's place and also that patient's mental status is currently altered.  Granddaughter went to patient's best friend's place and noted that she was very confused and altered, so she activated EMS and patient was taken to the ED for further evaluation and management.   ED Course:  In the emergency department, she was initially tachypneic with respiratory rate of 24/min, BP was 152/66, but other vital signs were within normal range.  Workup in the ED showed WBC of 14.2, hemoglobin 11.1, hematocrit 34.3, MCV 7.1, platelets 205. BMP was normal except for blood glucose of 67, BUN/creatinine 29/1.68 (baseline creatinine at 1.0).  Ammonia 22, magnesium 1.7, lactic acid was normal, troponin was 176 > 258 > 265 > 271, total CK 235.  Acetaminophen level was less than 10, salicylate level was less than 7, ethanol level was less than 10. Chest x-ray showed heterogeneous opacities left lung base  may represent atelectasis or infection CT head without contrast showed no acute intracranial abnormality CT angiography of chest with and without contrast showed no evidence of pulmonary artery embolism, no evidence of thoracic aortic dissection.  CT was suggestive of scarring or subsegmental atelectasis in the posterior lower lung fields. She was started on IV ceftriaxone for presumed UTI.  D5W was given due to hypoglycemia.  Patient was empirically placed on CIWA protocol, thiamine, folic acid and multivitamin.  Hospitalist was asked to admit patient for further evaluation and management.   Review of Systems: Review of systems as noted in the HPI. All other systems reviewed and are negative.    Hospital Course:     Brief Narrative:  72 y.o. female with medical history significant of GERD, history of pancreatic adenocarcinoma with pancreatic insufficiency , HLD  admitted on 07/25/2022 with acute metabolic encephalopathy in setting of ingestion of benadryl, BC powders and other OTC agents--    -Assessment and Plan: 1) acute metabolic encephalopathy--due to hypoglycemia and ingestion of benadryl, BC powders and other OTC agents-- and valium -UDS positive for benzo, patient has prescription for Valium -CT head and CTA chest without acute findings --apparently patient likes to take medications to get high -Patient and granddaughter Gara Kroner deny suicidal or homicidal ideation or plan -Poison control input appreciated -Mentation improved significantly -as per granddaughter mentation appears to be back to baseline   2) recurrent falls--- due to #1 above troponin was 176 > 258 > 265 > 271, total CK 235.  -Physical therapy eval appreciated recommends home health versus SNF rehab -She request home health services instead of SNF   3) E. coli UTI-treated with IV Rocephin  4)Acute kidney injury BUN/creatinine 29/1.68 (baseline creatinine at 1.0). Creatinine is down to 1.26 from 1.68 with hydration Renally adjust medications, avoid nephrotoxic agents/dehydration/hypotension   5) history of pancreatic malignancy with pancreatic insufficiency--- -- continue Creon with meals   Social/ethics--plan of care discussed with patient and patient's granddaughter Ms. Gara Kroner--- questions answered -Patient remains a full code     Disposition: The patient is from: Home              Anticipated d/c is to:  home with [facility] rehab  Discharge Condition: stable  Follow UP   Follow-up Information     Care, [facility] Follow up.   Specialty: Home Health Services Why: [facility] staff will call you to schedule in home visits Contact information: [address] [place] [address] [phone]                  Consults obtained -Poison control  Diet and Activity recommendation:  As advised  Discharge  Instructions    Discharge Instructions     Call MD for:  difficulty breathing, headache or visual disturbances   Complete by: As directed    Call MD for:  persistant dizziness or light-headedness   Complete by: As directed    Call MD for:  persistant nausea and vomiting   Complete by: As directed    Call MD for:  temperature >100.4   Complete by: As directed    Diet - low sodium heart healthy   Complete by: As directed    Discharge instructions   Complete by: As directed    1) please take medications only as prescribed 2) avoid taking excessive amounts of over-the-counter medications 3)follow up with care physician Dr. Richardean Chimera, MD --within the week for  recheck 4) repeat CBC and BMP blood test within a week advised   Increase activity slowly   Complete by: As directed        Discharge Medications     Allergies as of 07/27/2022   No Known Allergies      Medication List     STOP taking these medications    potassium chloride SA 20 MEQ tablet Commonly known as: KLOR-CON M       TAKE these medications    atorvastatin 20 MG tablet Commonly known as: LIPITOR Take 1 tablet (20 mg total) by mouth at bedtime.   diazepam 5 MG tablet Commonly known as: VALIUM Take 5 mg by mouth 2 (two) times daily.   DULoxetine 60 MG capsule Commonly known as: CYMBALTA Take 1 capsule (60 mg total) by mouth daily.   ibandronate 150 MG tablet Commonly known as: BONIVA Take by mouth every 30 (thirty) days.   lipase/protease/amylase [address] UNITS Cpep capsule Commonly known as: Creon Take 1 capsule (36,000 Units total) by mouth 3 (three) times daily with meals. What changed: medication strength   metoprolol succinate 25 MG 24 hr tablet Commonly known as: TOPROL-XL Take 0.5 tablets (12.5 mg total) by mouth daily.   omeprazole 40 MG capsule Commonly known as: PRILOSEC Take 1 capsule (40 mg total) by mouth daily.   potassium chloride 10 MEQ tablet Commonly known as:  KLOR-CON Take 1 tablet (10 mEq total) by mouth daily.   traMADol 50 MG tablet Commonly known as: ULTRAM 50 mg every 6 (six) hours as needed.       Major procedures and Radiology Reports - PLEASE review detailed and final reports for all details, in brief -   CT Angio Chest PE W and/or Wo Contrast  Result Date: 07/25/2022 CLINICAL DATA:  Altered mental status, high clinical suspicion for PE EXAM: CT ANGIOGRAPHY CHEST WITH CONTRAST TECHNIQUE: Multidetector CT imaging of the chest was performed using the standard protocol during bolus administration of intravenous contrast. Multiplanar CT image reconstructions and MIPs were obtained to evaluate the vascular anatomy. RADIATION DOSE REDUCTION: This exam was performed according to the departmental dose-optimization program which includes automated exposure control, adjustment of the mA and/or kV according to patient size and/or use of iterative reconstruction technique. CONTRAST:  60mL OMNIPAQUE IOHEXOL 350 MG/ML SOLN COMPARISON:  Chest radiographs done earlier today FINDINGS: Cardiovascular: There are no intraluminal filling defects in central pulmonary artery branches. Motion artifacts limit evaluation of small peripheral pulmonary artery branches. There is homogeneous enhancement in thoracic aorta. There are scattered calcifications and atherosclerotic plaques in thoracic aorta and its major branches. Coronary artery calcifications are seen. Mediastinum/Nodes: No significant lymphadenopathy is seen. Lungs/Pleura: There are small linear densities in the posterior lower lung fields, more so on the left side. There is prominent epicardial fat pad at left cardiophrenic angle which may account for increased density at the left costophrenic angle seen in the chest radiographs. Upper Abdomen: Gallbladder is not seen. Air is noted in bile duct branches, possibly suggesting previous sphincterotomy. Small hiatal hernia is seen. Musculoskeletal: No acute findings  are seen. There is encroachment of neural foramina by bony spurs in lower cervical spine. Review of the MIP images confirms the above findings. IMPRESSION: There is no evidence of pulmonary artery embolism. There is no evidence of thoracic aortic dissection. Aortic arteriosclerosis. Coronary artery calcifications are seen. Small linear densities are seen in the posterior lower lung fields suggesting scarring or subsegmental atelectasis. There is no focal pulmonary consolidation.  There is prominence of epicardial fat pad at the left cardiophrenic angle which may account for blunting of left lateral CP angle seen in the chest radiographs. There is no pleural effusion or pneumothorax. Small hiatal hernia. Air in the bile ducts may be due to previous sphincterotomy. Electronically Signed   By: Ernie Avena M.D.   On: 07/25/2022 18:45   CT Head Wo Contrast  Result Date: 07/25/2022 CLINICAL DATA:  Altered mental status. EXAM: CT HEAD WITHOUT CONTRAST TECHNIQUE: Contiguous axial images were obtained from the base of the skull through the vertex without intravenous contrast. RADIATION DOSE REDUCTION: This exam was performed according to the departmental dose-optimization program which includes automated exposure control, adjustment of the mA and/or kV according to patient size and/or use of iterative reconstruction technique. COMPARISON:  06/06/2008 FINDINGS: Brain: No evidence of intracranial hemorrhage, acute infarction, hydrocephalus, extra-axial collection, or mass lesion/mass effect. 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 Mild cerebral atrophy and chronic small vessel disease also noted. Vascular:  No hyperdense vessel or other acute findings. Skull: No evidence of fracture or other significant bone abnormality. Sinuses/Orbits:  No acute findings. Other: None. IMPRESSION: No acute intracranial abnormality. 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 Mild  cerebral atrophy and chronic small vessel disease. Electronically Signed   By: Danae Orleans M.D.   On: 07/25/2022 15:21   DG Chest Port 1 View  Result Date: 07/25/2022 CLINICAL DATA:  Shortness of breath EXAM: PORTABLE CHEST 1 VIEW COMPARISON:  Chest radiograph 06/06/2008 FINDINGS: Low lung volume exam. Stable cardiac and mediastinal contours. Heterogeneous opacities left lung base. No pleural effusion or pneumothorax. Thoracic spine degenerative changes. IMPRESSION: Heterogeneous opacities left lung base may represent atelectasis or infection. Electronically Signed   By: Annia Belt M.D.   On: 07/25/2022 15:14    Micro Results   Recent Results (from the past 240 hour(s))  Urine Culture     Status: Abnormal   Collection Time: 07/25/22  4:00 PM   Specimen: Urine, Random  Result Value Ref Range Status   Specimen Description   Final    URINE, RANDOM Performed at [facility], [address] [place], [place] [address]    Special Requests   Final    NONE Reflexed from [id number] Performed at [facility], [address], [place], [place] [address]    Culture >=100,000 COLONIES/mL ESCHERICHIA COLI (A)  Final   Report Status 07/27/2022 FINAL  Final   Organism ID, Bacteria ESCHERICHIA COLI (A)  Final      Susceptibility   Escherichia coli - MIC*    AMPICILLIN 8 SENSITIVE Sensitive     CEFAZOLIN <=4 SENSITIVE Sensitive     CEFEPIME <=0.12 SENSITIVE Sensitive     CEFTRIAXONE <=0.25 SENSITIVE Sensitive     CIPROFLOXACIN <=0.25 SENSITIVE Sensitive     GENTAMICIN <=1 SENSITIVE Sensitive     IMIPENEM <=0.25 SENSITIVE Sensitive     NITROFURANTOIN 64 INTERMEDIATE Intermediate     TRIMETH/SULFA <=20 SENSITIVE Sensitive     AMPICILLIN/SULBACTAM 4 SENSITIVE Sensitive     PIP/TAZO <=4 SENSITIVE Sensitive     * >=100,000 COLONIES/mL ESCHERICHIA COLI  MRSA Next Gen by PCR, Nasal     Status: None   Collection Time: 07/25/22 10:26 PM   Specimen: Nasal Mucosa; Nasal Swab  Result Value Ref Range Status    MRSA by PCR Next Gen NOT DETECTED NOT DETECTED Final    Comment: (NOTE) The GeneXpert MRSA Assay (FDA approved for  NASAL specimens only), is one component of a comprehensive MRSA colonization surveillance program. It is not intended to diagnose MRSA infection nor to guide or monitor treatment for MRSA infections. Test performance is not FDA approved in patients less than 48 years old. Performed at [facility], [address], [place], [place] [address]    Today   Subjective    Liesel Danza today has no new complaints  No fever  Or chills   No Nausea, Vomiting or Diarrhea        Patient has been seen and examined prior to discharge   Objective   Blood pressure 94/66, pulse 73, temperature 97.7 F (36.5 C), temperature source Oral, resp. rate 19, height 5' (1.524 m), weight 50.8 kg, SpO2 96 %.   Intake/Output Summary (Last 24 hours) at 07/27/2022 0908 Last data filed at 07/26/2022 1448 Gross per 24 hour  Intake 660 ml  Output --  Net 660 ml    Exam Gen:- Awake Alert, no acute distress  HEENT:- [place].AT, No sclera icterus Neck-Supple Neck,No JVD,.  Lungs-  CTAB , good air movement bilaterally CV- S1, S2 normal, regular Abd-  +ve B.Sounds, Abd Soft, No tenderness,    Extremity/Skin:- No  edema,   good pulses Psych-affect is appropriate, oriented x3, as per granddaughter mentation appears to be back to baseline Neuro-generalized weakness ,no new focal deficits, no tremors    Data Review   CBC w Diff:  Lab Results  Component Value Date   WBC 15.2 (H) 07/26/2022   HGB 10.8 (L) 07/26/2022   HCT 32.9 (L) 07/26/2022   PLT 229 07/26/2022   LYMPHOPCT 10 07/25/2022   MONOPCT 5 07/25/2022   EOSPCT 0 07/25/2022   BASOPCT 0 07/25/2022   CMP:  Lab Results  Component Value Date   NA 138 07/26/2022   K 3.4 (L) 07/26/2022   CL 107 07/26/2022   CO2 20 (L) 07/26/2022   BUN 23 07/26/2022   CREATININE 1.26 (H) 07/26/2022   PROT 6.0 (L) 07/26/2022   ALBUMIN 3.4 (L)  07/26/2022   BILITOT 1.3 (H) 07/26/2022   ALKPHOS 67 07/26/2022   AST 34 07/26/2022   ALT 24 07/26/2022  .  Total Discharge time is about 33 minutes  Shon Hale M.D on 07/27/2022 at 9:08 AM  Go to [email or web address] -  for contact info  [facility] - Office  [phone]

## 2022-07-27 NOTE — Progress Notes (Signed)
Patient stated, " I threw up in this cup right here".  Unable to tell what color or consistancy vomitus was because the container had another substance in it. Prn medication given.

## 2022-07-27 NOTE — Progress Notes (Signed)
Mobility Specialist Progress Note:    07/27/22 1025  Mobility  Activity Transferred from bed to chair  Level of Assistance Minimal assist, patient does 75% or more  Assistive Device Front wheel walker  Distance Ambulated (ft) 4 ft  Range of Motion/Exercises Active;All extremities  Activity Response Tolerated well  Mobility Referral Yes  $Mobility charge 1 Mobility  Mobility Specialist Start Time (ACUTE ONLY) 1012  Mobility Specialist Stop Time (ACUTE ONLY) 1025  Mobility Specialist Time Calculation (min) (ACUTE ONLY) 13 min   Pt agreeable to mobility session, received in bed. MinA required to transfer B>C with RW. Tolerated well, asx throughout. Left pt in chair, all needs met, nursing notified.   Feliciana Rossetti Mobility Specialist Please contact via Special educational needs teacher or  Rehab office at [phone]

## 2022-07-27 NOTE — Progress Notes (Signed)
Patient slept most of the night. Assisted to the bathroom x2 with walker. Patient tolerated well. One prn medication given during this shift. Plan of care ongoing.

## 2022-07-27 NOTE — Discharge Instructions (Signed)
1) please take medications only as prescribed 2) avoid taking excessive amounts of over-the-counter medications 3)follow up with care physician Dr. Richardean Chimera, MD --within the week for recheck 4) repeat CBC and BMP blood test within a week advised

## 2022-07-29 DIAGNOSIS — T50911D Poisoning by multiple unspecified drugs, medicaments and biological substances, accidental (unintentional), subsequent encounter: Secondary | ICD-10-CM | POA: Diagnosis not present

## 2022-07-29 DIAGNOSIS — Z87891 Personal history of nicotine dependence: Secondary | ICD-10-CM | POA: Diagnosis not present

## 2022-07-29 DIAGNOSIS — Z8507 Personal history of malignant neoplasm of pancreas: Secondary | ICD-10-CM | POA: Diagnosis not present

## 2022-07-29 DIAGNOSIS — I700 Atherosclerosis of aorta: Secondary | ICD-10-CM | POA: Diagnosis not present

## 2022-07-29 DIAGNOSIS — G9341 Metabolic encephalopathy: Secondary | ICD-10-CM | POA: Diagnosis not present

## 2022-07-29 DIAGNOSIS — E782 Mixed hyperlipidemia: Secondary | ICD-10-CM | POA: Diagnosis not present

## 2022-07-29 DIAGNOSIS — F32A Depression, unspecified: Secondary | ICD-10-CM | POA: Diagnosis not present

## 2022-07-29 DIAGNOSIS — I2510 Atherosclerotic heart disease of native coronary artery without angina pectoris: Secondary | ICD-10-CM | POA: Diagnosis not present

## 2022-07-29 DIAGNOSIS — G319 Degenerative disease of nervous system, unspecified: Secondary | ICD-10-CM | POA: Diagnosis not present

## 2022-07-29 DIAGNOSIS — N179 Acute kidney failure, unspecified: Secondary | ICD-10-CM | POA: Diagnosis not present

## 2022-07-29 DIAGNOSIS — G8929 Other chronic pain: Secondary | ICD-10-CM | POA: Diagnosis not present

## 2022-07-29 DIAGNOSIS — Z9181 History of falling: Secondary | ICD-10-CM | POA: Diagnosis not present

## 2022-07-29 DIAGNOSIS — K219 Gastro-esophageal reflux disease without esophagitis: Secondary | ICD-10-CM | POA: Diagnosis not present

## 2022-08-06 DIAGNOSIS — E782 Mixed hyperlipidemia: Secondary | ICD-10-CM | POA: Diagnosis not present

## 2022-08-06 DIAGNOSIS — Z8507 Personal history of malignant neoplasm of pancreas: Secondary | ICD-10-CM | POA: Diagnosis not present

## 2022-08-06 DIAGNOSIS — F32A Depression, unspecified: Secondary | ICD-10-CM | POA: Diagnosis not present

## 2022-08-06 DIAGNOSIS — I2510 Atherosclerotic heart disease of native coronary artery without angina pectoris: Secondary | ICD-10-CM | POA: Diagnosis not present

## 2022-08-06 DIAGNOSIS — G8929 Other chronic pain: Secondary | ICD-10-CM | POA: Diagnosis not present

## 2022-08-06 DIAGNOSIS — K219 Gastro-esophageal reflux disease without esophagitis: Secondary | ICD-10-CM | POA: Diagnosis not present

## 2022-08-06 DIAGNOSIS — Z9181 History of falling: Secondary | ICD-10-CM | POA: Diagnosis not present

## 2022-08-06 DIAGNOSIS — T50911D Poisoning by multiple unspecified drugs, medicaments and biological substances, accidental (unintentional), subsequent encounter: Secondary | ICD-10-CM | POA: Diagnosis not present

## 2022-08-06 DIAGNOSIS — I700 Atherosclerosis of aorta: Secondary | ICD-10-CM | POA: Diagnosis not present

## 2022-08-06 DIAGNOSIS — G319 Degenerative disease of nervous system, unspecified: Secondary | ICD-10-CM | POA: Diagnosis not present

## 2022-08-06 DIAGNOSIS — G9341 Metabolic encephalopathy: Secondary | ICD-10-CM | POA: Diagnosis not present

## 2022-08-06 DIAGNOSIS — N179 Acute kidney failure, unspecified: Secondary | ICD-10-CM | POA: Diagnosis not present

## 2022-08-06 DIAGNOSIS — Z87891 Personal history of nicotine dependence: Secondary | ICD-10-CM | POA: Diagnosis not present

## 2022-08-10 DIAGNOSIS — G8929 Other chronic pain: Secondary | ICD-10-CM | POA: Diagnosis not present

## 2022-08-10 DIAGNOSIS — G319 Degenerative disease of nervous system, unspecified: Secondary | ICD-10-CM | POA: Diagnosis not present

## 2022-08-10 DIAGNOSIS — Z9181 History of falling: Secondary | ICD-10-CM | POA: Diagnosis not present

## 2022-08-10 DIAGNOSIS — F32A Depression, unspecified: Secondary | ICD-10-CM | POA: Diagnosis not present

## 2022-08-10 DIAGNOSIS — Z8507 Personal history of malignant neoplasm of pancreas: Secondary | ICD-10-CM | POA: Diagnosis not present

## 2022-08-10 DIAGNOSIS — T50911D Poisoning by multiple unspecified drugs, medicaments and biological substances, accidental (unintentional), subsequent encounter: Secondary | ICD-10-CM | POA: Diagnosis not present

## 2022-08-10 DIAGNOSIS — E782 Mixed hyperlipidemia: Secondary | ICD-10-CM | POA: Diagnosis not present

## 2022-08-10 DIAGNOSIS — I700 Atherosclerosis of aorta: Secondary | ICD-10-CM | POA: Diagnosis not present

## 2022-08-10 DIAGNOSIS — N179 Acute kidney failure, unspecified: Secondary | ICD-10-CM | POA: Diagnosis not present

## 2022-08-10 DIAGNOSIS — G9341 Metabolic encephalopathy: Secondary | ICD-10-CM | POA: Diagnosis not present

## 2022-08-10 DIAGNOSIS — Z87891 Personal history of nicotine dependence: Secondary | ICD-10-CM | POA: Diagnosis not present

## 2022-08-10 DIAGNOSIS — I2510 Atherosclerotic heart disease of native coronary artery without angina pectoris: Secondary | ICD-10-CM | POA: Diagnosis not present

## 2022-08-10 DIAGNOSIS — K219 Gastro-esophageal reflux disease without esophagitis: Secondary | ICD-10-CM | POA: Diagnosis not present

## 2022-08-12 DIAGNOSIS — Z87891 Personal history of nicotine dependence: Secondary | ICD-10-CM | POA: Diagnosis not present

## 2022-08-12 DIAGNOSIS — F32A Depression, unspecified: Secondary | ICD-10-CM | POA: Diagnosis not present

## 2022-08-12 DIAGNOSIS — I700 Atherosclerosis of aorta: Secondary | ICD-10-CM | POA: Diagnosis not present

## 2022-08-12 DIAGNOSIS — E782 Mixed hyperlipidemia: Secondary | ICD-10-CM | POA: Diagnosis not present

## 2022-08-12 DIAGNOSIS — K219 Gastro-esophageal reflux disease without esophagitis: Secondary | ICD-10-CM | POA: Diagnosis not present

## 2022-08-12 DIAGNOSIS — Z8507 Personal history of malignant neoplasm of pancreas: Secondary | ICD-10-CM | POA: Diagnosis not present

## 2022-08-12 DIAGNOSIS — T50911D Poisoning by multiple unspecified drugs, medicaments and biological substances, accidental (unintentional), subsequent encounter: Secondary | ICD-10-CM | POA: Diagnosis not present

## 2022-08-12 DIAGNOSIS — Z9181 History of falling: Secondary | ICD-10-CM | POA: Diagnosis not present

## 2022-08-12 DIAGNOSIS — N179 Acute kidney failure, unspecified: Secondary | ICD-10-CM | POA: Diagnosis not present

## 2022-08-12 DIAGNOSIS — I2510 Atherosclerotic heart disease of native coronary artery without angina pectoris: Secondary | ICD-10-CM | POA: Diagnosis not present

## 2022-08-12 DIAGNOSIS — G319 Degenerative disease of nervous system, unspecified: Secondary | ICD-10-CM | POA: Diagnosis not present

## 2022-08-12 DIAGNOSIS — G8929 Other chronic pain: Secondary | ICD-10-CM | POA: Diagnosis not present

## 2022-08-12 DIAGNOSIS — G9341 Metabolic encephalopathy: Secondary | ICD-10-CM | POA: Diagnosis not present

## 2022-08-18 DIAGNOSIS — Z9181 History of falling: Secondary | ICD-10-CM | POA: Diagnosis not present

## 2022-08-18 DIAGNOSIS — G8929 Other chronic pain: Secondary | ICD-10-CM | POA: Diagnosis not present

## 2022-08-18 DIAGNOSIS — I2510 Atherosclerotic heart disease of native coronary artery without angina pectoris: Secondary | ICD-10-CM | POA: Diagnosis not present

## 2022-08-18 DIAGNOSIS — N179 Acute kidney failure, unspecified: Secondary | ICD-10-CM | POA: Diagnosis not present

## 2022-08-18 DIAGNOSIS — E782 Mixed hyperlipidemia: Secondary | ICD-10-CM | POA: Diagnosis not present

## 2022-08-18 DIAGNOSIS — I700 Atherosclerosis of aorta: Secondary | ICD-10-CM | POA: Diagnosis not present

## 2022-08-18 DIAGNOSIS — K219 Gastro-esophageal reflux disease without esophagitis: Secondary | ICD-10-CM | POA: Diagnosis not present

## 2022-08-18 DIAGNOSIS — G319 Degenerative disease of nervous system, unspecified: Secondary | ICD-10-CM | POA: Diagnosis not present

## 2022-08-18 DIAGNOSIS — G9341 Metabolic encephalopathy: Secondary | ICD-10-CM | POA: Diagnosis not present

## 2022-08-18 DIAGNOSIS — Z8507 Personal history of malignant neoplasm of pancreas: Secondary | ICD-10-CM | POA: Diagnosis not present

## 2022-08-18 DIAGNOSIS — T50911D Poisoning by multiple unspecified drugs, medicaments and biological substances, accidental (unintentional), subsequent encounter: Secondary | ICD-10-CM | POA: Diagnosis not present

## 2022-08-18 DIAGNOSIS — F32A Depression, unspecified: Secondary | ICD-10-CM | POA: Diagnosis not present

## 2022-08-18 DIAGNOSIS — Z87891 Personal history of nicotine dependence: Secondary | ICD-10-CM | POA: Diagnosis not present

## 2022-08-20 DIAGNOSIS — K219 Gastro-esophageal reflux disease without esophagitis: Secondary | ICD-10-CM | POA: Diagnosis not present

## 2022-08-20 DIAGNOSIS — G8929 Other chronic pain: Secondary | ICD-10-CM | POA: Diagnosis not present

## 2022-08-20 DIAGNOSIS — G9341 Metabolic encephalopathy: Secondary | ICD-10-CM | POA: Diagnosis not present

## 2022-08-20 DIAGNOSIS — Z87891 Personal history of nicotine dependence: Secondary | ICD-10-CM | POA: Diagnosis not present

## 2022-08-20 DIAGNOSIS — Z9181 History of falling: Secondary | ICD-10-CM | POA: Diagnosis not present

## 2022-08-20 DIAGNOSIS — I700 Atherosclerosis of aorta: Secondary | ICD-10-CM | POA: Diagnosis not present

## 2022-08-20 DIAGNOSIS — T50911D Poisoning by multiple unspecified drugs, medicaments and biological substances, accidental (unintentional), subsequent encounter: Secondary | ICD-10-CM | POA: Diagnosis not present

## 2022-08-20 DIAGNOSIS — I2510 Atherosclerotic heart disease of native coronary artery without angina pectoris: Secondary | ICD-10-CM | POA: Diagnosis not present

## 2022-08-20 DIAGNOSIS — Z8507 Personal history of malignant neoplasm of pancreas: Secondary | ICD-10-CM | POA: Diagnosis not present

## 2022-08-20 DIAGNOSIS — E782 Mixed hyperlipidemia: Secondary | ICD-10-CM | POA: Diagnosis not present

## 2022-08-20 DIAGNOSIS — G319 Degenerative disease of nervous system, unspecified: Secondary | ICD-10-CM | POA: Diagnosis not present

## 2022-08-20 DIAGNOSIS — N179 Acute kidney failure, unspecified: Secondary | ICD-10-CM | POA: Diagnosis not present

## 2022-08-20 DIAGNOSIS — F32A Depression, unspecified: Secondary | ICD-10-CM | POA: Diagnosis not present

## 2022-08-24 DIAGNOSIS — I2510 Atherosclerotic heart disease of native coronary artery without angina pectoris: Secondary | ICD-10-CM | POA: Diagnosis not present

## 2022-08-24 DIAGNOSIS — Z9181 History of falling: Secondary | ICD-10-CM | POA: Diagnosis not present

## 2022-08-24 DIAGNOSIS — Z8507 Personal history of malignant neoplasm of pancreas: Secondary | ICD-10-CM | POA: Diagnosis not present

## 2022-08-24 DIAGNOSIS — G9341 Metabolic encephalopathy: Secondary | ICD-10-CM | POA: Diagnosis not present

## 2022-08-24 DIAGNOSIS — E782 Mixed hyperlipidemia: Secondary | ICD-10-CM | POA: Diagnosis not present

## 2022-08-24 DIAGNOSIS — G319 Degenerative disease of nervous system, unspecified: Secondary | ICD-10-CM | POA: Diagnosis not present

## 2022-08-24 DIAGNOSIS — G8929 Other chronic pain: Secondary | ICD-10-CM | POA: Diagnosis not present

## 2022-08-24 DIAGNOSIS — N179 Acute kidney failure, unspecified: Secondary | ICD-10-CM | POA: Diagnosis not present

## 2022-08-24 DIAGNOSIS — Z87891 Personal history of nicotine dependence: Secondary | ICD-10-CM | POA: Diagnosis not present

## 2022-08-24 DIAGNOSIS — T50911D Poisoning by multiple unspecified drugs, medicaments and biological substances, accidental (unintentional), subsequent encounter: Secondary | ICD-10-CM | POA: Diagnosis not present

## 2022-08-24 DIAGNOSIS — F32A Depression, unspecified: Secondary | ICD-10-CM | POA: Diagnosis not present

## 2022-08-24 DIAGNOSIS — K219 Gastro-esophageal reflux disease without esophagitis: Secondary | ICD-10-CM | POA: Diagnosis not present

## 2022-08-24 DIAGNOSIS — I700 Atherosclerosis of aorta: Secondary | ICD-10-CM | POA: Diagnosis not present

## 2022-08-26 DIAGNOSIS — Z87891 Personal history of nicotine dependence: Secondary | ICD-10-CM | POA: Diagnosis not present

## 2022-08-26 DIAGNOSIS — I2510 Atherosclerotic heart disease of native coronary artery without angina pectoris: Secondary | ICD-10-CM | POA: Diagnosis not present

## 2022-08-26 DIAGNOSIS — G319 Degenerative disease of nervous system, unspecified: Secondary | ICD-10-CM | POA: Diagnosis not present

## 2022-08-26 DIAGNOSIS — G8929 Other chronic pain: Secondary | ICD-10-CM | POA: Diagnosis not present

## 2022-08-26 DIAGNOSIS — T50911D Poisoning by multiple unspecified drugs, medicaments and biological substances, accidental (unintentional), subsequent encounter: Secondary | ICD-10-CM | POA: Diagnosis not present

## 2022-08-26 DIAGNOSIS — N179 Acute kidney failure, unspecified: Secondary | ICD-10-CM | POA: Diagnosis not present

## 2022-08-26 DIAGNOSIS — Z9181 History of falling: Secondary | ICD-10-CM | POA: Diagnosis not present

## 2022-08-26 DIAGNOSIS — E782 Mixed hyperlipidemia: Secondary | ICD-10-CM | POA: Diagnosis not present

## 2022-08-26 DIAGNOSIS — G9341 Metabolic encephalopathy: Secondary | ICD-10-CM | POA: Diagnosis not present

## 2022-08-26 DIAGNOSIS — I700 Atherosclerosis of aorta: Secondary | ICD-10-CM | POA: Diagnosis not present

## 2022-08-26 DIAGNOSIS — F32A Depression, unspecified: Secondary | ICD-10-CM | POA: Diagnosis not present

## 2022-08-26 DIAGNOSIS — Z8507 Personal history of malignant neoplasm of pancreas: Secondary | ICD-10-CM | POA: Diagnosis not present

## 2022-08-26 DIAGNOSIS — K219 Gastro-esophageal reflux disease without esophagitis: Secondary | ICD-10-CM | POA: Diagnosis not present

## 2022-08-27 DIAGNOSIS — M1612 Unilateral primary osteoarthritis, left hip: Secondary | ICD-10-CM | POA: Diagnosis not present

## 2022-08-27 DIAGNOSIS — E876 Hypokalemia: Secondary | ICD-10-CM | POA: Diagnosis not present

## 2022-08-27 DIAGNOSIS — K8681 Exocrine pancreatic insufficiency: Secondary | ICD-10-CM | POA: Diagnosis not present

## 2022-08-27 DIAGNOSIS — I700 Atherosclerosis of aorta: Secondary | ICD-10-CM | POA: Diagnosis not present

## 2022-08-27 DIAGNOSIS — I10 Essential (primary) hypertension: Secondary | ICD-10-CM | POA: Diagnosis not present

## 2022-08-27 DIAGNOSIS — I82891 Chronic embolism and thrombosis of other specified veins: Secondary | ICD-10-CM | POA: Diagnosis not present

## 2022-08-27 DIAGNOSIS — K2100 Gastro-esophageal reflux disease with esophagitis, without bleeding: Secondary | ICD-10-CM | POA: Diagnosis not present

## 2022-08-27 DIAGNOSIS — E538 Deficiency of other specified B group vitamins: Secondary | ICD-10-CM | POA: Diagnosis not present

## 2022-08-27 DIAGNOSIS — E7849 Other hyperlipidemia: Secondary | ICD-10-CM | POA: Diagnosis not present

## 2022-08-27 DIAGNOSIS — Z6823 Body mass index (BMI) 23.0-23.9, adult: Secondary | ICD-10-CM | POA: Diagnosis not present

## 2022-08-27 DIAGNOSIS — M1611 Unilateral primary osteoarthritis, right hip: Secondary | ICD-10-CM | POA: Diagnosis not present

## 2022-09-22 DIAGNOSIS — I10 Essential (primary) hypertension: Secondary | ICD-10-CM | POA: Diagnosis not present

## 2022-09-22 DIAGNOSIS — Z23 Encounter for immunization: Secondary | ICD-10-CM | POA: Diagnosis not present

## 2022-09-22 DIAGNOSIS — I700 Atherosclerosis of aorta: Secondary | ICD-10-CM | POA: Diagnosis not present

## 2022-09-22 DIAGNOSIS — Z1389 Encounter for screening for other disorder: Secondary | ICD-10-CM | POA: Diagnosis not present

## 2022-09-22 DIAGNOSIS — E538 Deficiency of other specified B group vitamins: Secondary | ICD-10-CM | POA: Diagnosis not present

## 2022-09-22 DIAGNOSIS — K8681 Exocrine pancreatic insufficiency: Secondary | ICD-10-CM | POA: Diagnosis not present

## 2022-09-22 DIAGNOSIS — E7849 Other hyperlipidemia: Secondary | ICD-10-CM | POA: Diagnosis not present

## 2022-09-22 DIAGNOSIS — Z0001 Encounter for general adult medical examination with abnormal findings: Secondary | ICD-10-CM | POA: Diagnosis not present

## 2022-09-22 DIAGNOSIS — E876 Hypokalemia: Secondary | ICD-10-CM | POA: Diagnosis not present

## 2022-09-22 DIAGNOSIS — I82891 Chronic embolism and thrombosis of other specified veins: Secondary | ICD-10-CM | POA: Diagnosis not present

## 2023-01-12 DIAGNOSIS — E039 Hypothyroidism, unspecified: Secondary | ICD-10-CM | POA: Diagnosis not present

## 2023-01-12 DIAGNOSIS — R748 Abnormal levels of other serum enzymes: Secondary | ICD-10-CM | POA: Diagnosis not present

## 2023-01-12 DIAGNOSIS — J449 Chronic obstructive pulmonary disease, unspecified: Secondary | ICD-10-CM | POA: Diagnosis not present

## 2023-01-12 DIAGNOSIS — K8681 Exocrine pancreatic insufficiency: Secondary | ICD-10-CM | POA: Diagnosis not present

## 2023-01-12 DIAGNOSIS — N183 Chronic kidney disease, stage 3 unspecified: Secondary | ICD-10-CM | POA: Diagnosis not present

## 2023-01-12 DIAGNOSIS — N189 Chronic kidney disease, unspecified: Secondary | ICD-10-CM | POA: Diagnosis not present

## 2023-01-12 DIAGNOSIS — E782 Mixed hyperlipidemia: Secondary | ICD-10-CM | POA: Diagnosis not present

## 2023-01-12 DIAGNOSIS — E7849 Other hyperlipidemia: Secondary | ICD-10-CM | POA: Diagnosis not present

## 2023-03-08 DIAGNOSIS — D631 Anemia in chronic kidney disease: Secondary | ICD-10-CM | POA: Diagnosis not present

## 2023-03-08 DIAGNOSIS — E559 Vitamin D deficiency, unspecified: Secondary | ICD-10-CM | POA: Diagnosis not present

## 2023-03-08 DIAGNOSIS — Z6823 Body mass index (BMI) 23.0-23.9, adult: Secondary | ICD-10-CM | POA: Diagnosis not present

## 2023-03-08 DIAGNOSIS — N1831 Chronic kidney disease, stage 3a: Secondary | ICD-10-CM | POA: Diagnosis not present

## 2023-03-08 DIAGNOSIS — E538 Deficiency of other specified B group vitamins: Secondary | ICD-10-CM | POA: Diagnosis not present

## 2023-03-08 DIAGNOSIS — K8681 Exocrine pancreatic insufficiency: Secondary | ICD-10-CM | POA: Diagnosis not present

## 2023-03-17 DIAGNOSIS — N390 Urinary tract infection, site not specified: Secondary | ICD-10-CM | POA: Diagnosis not present

## 2023-03-17 DIAGNOSIS — R030 Elevated blood-pressure reading, without diagnosis of hypertension: Secondary | ICD-10-CM | POA: Diagnosis not present

## 2023-03-17 DIAGNOSIS — Z6821 Body mass index (BMI) 21.0-21.9, adult: Secondary | ICD-10-CM | POA: Diagnosis not present

## 2023-03-19 DIAGNOSIS — T50995A Adverse effect of other drugs, medicaments and biological substances, initial encounter: Secondary | ICD-10-CM | POA: Diagnosis not present

## 2023-03-19 DIAGNOSIS — N3000 Acute cystitis without hematuria: Secondary | ICD-10-CM | POA: Diagnosis not present

## 2023-03-19 DIAGNOSIS — R030 Elevated blood-pressure reading, without diagnosis of hypertension: Secondary | ICD-10-CM | POA: Diagnosis not present

## 2023-03-19 DIAGNOSIS — Z6821 Body mass index (BMI) 21.0-21.9, adult: Secondary | ICD-10-CM | POA: Diagnosis not present

## 2023-04-07 DIAGNOSIS — E782 Mixed hyperlipidemia: Secondary | ICD-10-CM | POA: Diagnosis not present

## 2023-04-07 DIAGNOSIS — R300 Dysuria: Secondary | ICD-10-CM | POA: Diagnosis not present

## 2023-04-07 DIAGNOSIS — J449 Chronic obstructive pulmonary disease, unspecified: Secondary | ICD-10-CM | POA: Diagnosis not present

## 2023-04-07 DIAGNOSIS — I10 Essential (primary) hypertension: Secondary | ICD-10-CM | POA: Diagnosis not present

## 2023-04-07 DIAGNOSIS — R030 Elevated blood-pressure reading, without diagnosis of hypertension: Secondary | ICD-10-CM | POA: Diagnosis not present

## 2023-04-07 DIAGNOSIS — K8681 Exocrine pancreatic insufficiency: Secondary | ICD-10-CM | POA: Diagnosis not present

## 2023-04-07 DIAGNOSIS — Z6822 Body mass index (BMI) 22.0-22.9, adult: Secondary | ICD-10-CM | POA: Diagnosis not present

## 2023-04-23 DIAGNOSIS — R319 Hematuria, unspecified: Secondary | ICD-10-CM | POA: Diagnosis not present

## 2023-04-23 DIAGNOSIS — R410 Disorientation, unspecified: Secondary | ICD-10-CM | POA: Diagnosis not present

## 2023-04-23 DIAGNOSIS — N10 Acute tubulo-interstitial nephritis: Secondary | ICD-10-CM | POA: Diagnosis not present

## 2023-05-08 ENCOUNTER — Encounter (HOSPITAL_COMMUNITY): Admission: EM | Disposition: E | Payer: Self-pay | Source: Ambulatory Visit | Attending: Internal Medicine

## 2023-05-08 DIAGNOSIS — I2510 Atherosclerotic heart disease of native coronary artery without angina pectoris: Secondary | ICD-10-CM | POA: Diagnosis not present

## 2023-05-08 DIAGNOSIS — I219 Acute myocardial infarction, unspecified: Secondary | ICD-10-CM | POA: Diagnosis not present

## 2023-05-08 DIAGNOSIS — R531 Weakness: Secondary | ICD-10-CM | POA: Diagnosis not present

## 2023-05-08 DIAGNOSIS — R109 Unspecified abdominal pain: Secondary | ICD-10-CM | POA: Diagnosis not present

## 2023-05-08 DIAGNOSIS — R1084 Generalized abdominal pain: Secondary | ICD-10-CM | POA: Diagnosis not present

## 2023-05-08 DIAGNOSIS — D72829 Elevated white blood cell count, unspecified: Secondary | ICD-10-CM | POA: Diagnosis not present

## 2023-05-08 DIAGNOSIS — I314 Cardiac tamponade: Secondary | ICD-10-CM | POA: Diagnosis not present

## 2023-05-08 DIAGNOSIS — N2889 Other specified disorders of kidney and ureter: Secondary | ICD-10-CM | POA: Diagnosis not present

## 2023-05-08 DIAGNOSIS — Z452 Encounter for adjustment and management of vascular access device: Secondary | ICD-10-CM | POA: Diagnosis not present

## 2023-05-08 DIAGNOSIS — Z743 Need for continuous supervision: Secondary | ICD-10-CM | POA: Diagnosis not present

## 2023-05-08 DIAGNOSIS — K8591 Acute pancreatitis with uninfected necrosis, unspecified: Secondary | ICD-10-CM | POA: Diagnosis not present

## 2023-05-08 DIAGNOSIS — R001 Bradycardia, unspecified: Secondary | ICD-10-CM | POA: Diagnosis not present

## 2023-05-08 DIAGNOSIS — Z87440 Personal history of urinary (tract) infections: Secondary | ICD-10-CM | POA: Diagnosis not present

## 2023-05-08 DIAGNOSIS — R112 Nausea with vomiting, unspecified: Secondary | ICD-10-CM | POA: Diagnosis not present

## 2023-05-08 DIAGNOSIS — E782 Mixed hyperlipidemia: Secondary | ICD-10-CM | POA: Diagnosis not present

## 2023-05-08 DIAGNOSIS — I10 Essential (primary) hypertension: Secondary | ICD-10-CM | POA: Diagnosis not present

## 2023-05-08 DIAGNOSIS — R6889 Other general symptoms and signs: Secondary | ICD-10-CM | POA: Diagnosis not present

## 2023-05-08 DIAGNOSIS — I213 ST elevation (STEMI) myocardial infarction of unspecified site: Secondary | ICD-10-CM | POA: Diagnosis not present

## 2023-05-08 DIAGNOSIS — I469 Cardiac arrest, cause unspecified: Secondary | ICD-10-CM | POA: Diagnosis not present

## 2023-05-08 DIAGNOSIS — Z4682 Encounter for fitting and adjustment of non-vascular catheter: Secondary | ICD-10-CM | POA: Diagnosis not present

## 2023-05-08 DIAGNOSIS — E860 Dehydration: Secondary | ICD-10-CM | POA: Diagnosis not present

## 2023-05-08 DIAGNOSIS — Z87891 Personal history of nicotine dependence: Secondary | ICD-10-CM | POA: Diagnosis not present

## 2023-05-08 DIAGNOSIS — K8689 Other specified diseases of pancreas: Secondary | ICD-10-CM | POA: Diagnosis not present

## 2023-05-08 HISTORY — PX: PERICARDIOCENTESIS: CATH118255

## 2023-05-08 HISTORY — PX: RIGHT/LEFT HEART CATH AND CORONARY/GRAFT ANGIOGRAPHY: CATH118267

## 2023-05-08 SURGERY — RIGHT/LEFT HEART CATH AND CORONARY/GRAFT ANGIOGRAPHY
Anesthesia: LOCAL

## 2023-05-08 SURGICAL SUPPLY — 20 items
CATH INFINITI 5 FR JL3.5 (CATHETERS) IMPLANT
CATH INFINITI 5FR ANG PIGTAIL (CATHETERS) IMPLANT
CATH LAUNCHER 6FR JR4 (CATHETERS) IMPLANT
CATH SWAN GANZ VIP 7.5F (CATHETERS) IMPLANT
GLIDESHEATH SLEND A-KIT 6F 22G (SHEATH) IMPLANT
GLIDESHEATH SLEND SS 6F .021 (SHEATH) IMPLANT
GUIDEWIRE SAFE TJ AMPLATZ EXST (WIRE) IMPLANT
KIT ENCORE 26 ADVANTAGE (KITS) IMPLANT
KIT HEMO VALVE WATCHDOG (MISCELLANEOUS) IMPLANT
KIT SYRINGE INJ CVI SPIKEX1 (MISCELLANEOUS) IMPLANT
PACK CARDIAC CATHETERIZATION (CUSTOM PROCEDURE TRAY) ×1 IMPLANT
SET ATX-X65L (MISCELLANEOUS) IMPLANT
SHEATH PINNACLE 5F 10CM (SHEATH) IMPLANT
SHEATH PINNACLE 8F 10CM (SHEATH) IMPLANT
SHIELD CATH-GARD CONTAMINATION (MISCELLANEOUS) IMPLANT
TRAY PERICARDIOCENTESIS 6FX60 (TRAY / TRAY PROCEDURE) IMPLANT
WIRE AMPLATZ ST .035X145CM (WIRE) IMPLANT
WIRE EMERALD 3MM-J .025X260CM (WIRE) IMPLANT
WIRE EMERALD 3MM-J .035X260CM (WIRE) IMPLANT
WIRE MICRO SET SILHO 5FR 7 (SHEATH) IMPLANT

## 2023-05-09 ENCOUNTER — Encounter (HOSPITAL_COMMUNITY): Payer: Self-pay | Admitting: Internal Medicine

## 2023-05-09 ENCOUNTER — Emergency Department (HOSPITAL_COMMUNITY)
Admission: EM | Admit: 2023-05-09 | Discharge: 2023-05-10 | Disposition: E | Source: Ambulatory Visit | Attending: Internal Medicine | Admitting: Internal Medicine

## 2023-05-09 DIAGNOSIS — I213 ST elevation (STEMI) myocardial infarction of unspecified site: Secondary | ICD-10-CM

## 2023-05-09 DIAGNOSIS — Z8507 Personal history of malignant neoplasm of pancreas: Secondary | ICD-10-CM | POA: Insufficient documentation

## 2023-05-09 DIAGNOSIS — I2119 ST elevation (STEMI) myocardial infarction involving other coronary artery of inferior wall: Secondary | ICD-10-CM | POA: Diagnosis not present

## 2023-05-09 DIAGNOSIS — I314 Cardiac tamponade: Secondary | ICD-10-CM | POA: Diagnosis not present

## 2023-05-09 DIAGNOSIS — I2510 Atherosclerotic heart disease of native coronary artery without angina pectoris: Secondary | ICD-10-CM | POA: Diagnosis not present

## 2023-05-09 DIAGNOSIS — Z87891 Personal history of nicotine dependence: Secondary | ICD-10-CM | POA: Diagnosis not present

## 2023-05-09 DIAGNOSIS — I469 Cardiac arrest, cause unspecified: Secondary | ICD-10-CM | POA: Insufficient documentation

## 2023-05-09 LAB — POCT I-STAT 7, (LYTES, BLD GAS, ICA,H+H)
Acid-Base Excess: 9 mmol/L — ABNORMAL HIGH (ref 0.0–2.0)
Acid-base deficit: 4 mmol/L — ABNORMAL HIGH (ref 0.0–2.0)
Acid-base deficit: 9 mmol/L — ABNORMAL HIGH (ref 0.0–2.0)
Bicarbonate: 18.7 mmol/L — ABNORMAL LOW (ref 20.0–28.0)
Bicarbonate: 19.7 mmol/L — ABNORMAL LOW (ref 20.0–28.0)
Bicarbonate: 35.5 mmol/L — ABNORMAL HIGH (ref 20.0–28.0)
Calcium, Ion: 0.73 mmol/L — CL (ref 1.15–1.40)
Calcium, Ion: 0.96 mmol/L — ABNORMAL LOW (ref 1.15–1.40)
Calcium, Ion: 1 mmol/L — ABNORMAL LOW (ref 1.15–1.40)
HCT: 36 % (ref 36.0–46.0)
HCT: 39 % (ref 36.0–46.0)
HCT: 47 % — ABNORMAL HIGH (ref 36.0–46.0)
Hemoglobin: 12.2 g/dL (ref 12.0–15.0)
Hemoglobin: 13.3 g/dL (ref 12.0–15.0)
Hemoglobin: 16 g/dL — ABNORMAL HIGH (ref 12.0–15.0)
O2 Saturation: 100 %
O2 Saturation: 100 %
O2 Saturation: 100 %
Potassium: 3.1 mmol/L — ABNORMAL LOW (ref 3.5–5.1)
Potassium: 3.5 mmol/L (ref 3.5–5.1)
Potassium: 4.4 mmol/L (ref 3.5–5.1)
Sodium: 138 mmol/L (ref 135–145)
Sodium: 140 mmol/L (ref 135–145)
Sodium: 144 mmol/L (ref 135–145)
TCO2: 20 mmol/L — ABNORMAL LOW (ref 22–32)
TCO2: 21 mmol/L — ABNORMAL LOW (ref 22–32)
TCO2: 37 mmol/L — ABNORMAL HIGH (ref 22–32)
pCO2 arterial: 29.6 mmHg — ABNORMAL LOW (ref 32–48)
pCO2 arterial: 45.8 mmHg (ref 32–48)
pCO2 arterial: 55.8 mmHg — ABNORMAL HIGH (ref 32–48)
pH, Arterial: 7.22 — ABNORMAL LOW (ref 7.35–7.45)
pH, Arterial: 7.411 (ref 7.35–7.45)
pH, Arterial: 7.432 (ref 7.35–7.45)
pO2, Arterial: 257 mmHg — ABNORMAL HIGH (ref 83–108)
pO2, Arterial: 552 mmHg — ABNORMAL HIGH (ref 83–108)
pO2, Arterial: 583 mmHg — ABNORMAL HIGH (ref 83–108)

## 2023-05-09 LAB — POCT I-STAT EG7
Acid-Base Excess: 11 mmol/L — ABNORMAL HIGH (ref 0.0–2.0)
Bicarbonate: 38.9 mmol/L — ABNORMAL HIGH (ref 20.0–28.0)
Calcium, Ion: 0.95 mmol/L — ABNORMAL LOW (ref 1.15–1.40)
HCT: 41 % (ref 36.0–46.0)
Hemoglobin: 13.9 g/dL (ref 12.0–15.0)
O2 Saturation: 81 %
Potassium: 4.2 mmol/L (ref 3.5–5.1)
Sodium: 144 mmol/L (ref 135–145)
TCO2: 41 mmol/L — ABNORMAL HIGH (ref 22–32)
pCO2, Ven: 64.3 mmHg — ABNORMAL HIGH (ref 44–60)
pH, Ven: 7.39 (ref 7.25–7.43)
pO2, Ven: 47 mmHg — ABNORMAL HIGH (ref 32–45)

## 2023-05-09 LAB — CG4 I-STAT (LACTIC ACID): Lactic Acid, Venous: 2.5 mmol/L (ref 0.5–1.9)

## 2023-05-09 MED ORDER — SODIUM BICARBONATE 8.4 % IV SOLN
INTRAVENOUS | Status: DC | PRN
  Administered 2023-05-09: 50 meq via INTRAVENOUS
  Administered 2023-05-09 (×3): 150 meq via INTRAVENOUS

## 2023-05-09 MED ORDER — MIDAZOLAM-SODIUM CHLORIDE 100-0.9 MG/100ML-% IV SOLN
INTRAVENOUS | Status: AC | PRN
  Administered 2023-05-09: 4 mg/h via INTRAVENOUS

## 2023-05-09 MED ORDER — SODIUM BICARBONATE 8.4 % IV SOLN
INTRAVENOUS | Status: AC
Start: 2023-05-09 — End: ?
  Filled 2023-05-09: qty 200

## 2023-05-09 MED ORDER — IOHEXOL 350 MG/ML SOLN
INTRAVENOUS | Status: DC | PRN
  Administered 2023-05-09: 70 mL

## 2023-05-09 MED ORDER — HEPARIN SODIUM (PORCINE) 1000 UNIT/ML IJ SOLN
INTRAMUSCULAR | Status: DC | PRN
  Administered 2023-05-09: 5000 [IU] via INTRAVENOUS

## 2023-05-09 MED ORDER — NOREPINEPHRINE BITARTRATE 1 MG/ML IV SOLN
INTRAVENOUS | Status: AC | PRN
  Administered 2023-05-09: 2.5 ug/min via INTRAVENOUS

## 2023-05-09 MED ORDER — FUROSEMIDE 10 MG/ML IJ SOLN
INTRAMUSCULAR | Status: DC | PRN
  Administered 2023-05-09: 20 mg via INTRAVENOUS

## 2023-05-09 MED ORDER — VERAPAMIL HCL 2.5 MG/ML IV SOLN
INTRAVENOUS | Status: DC | PRN
  Administered 2023-05-09: 10 mL via INTRA_ARTERIAL

## 2023-05-09 MED ORDER — VASOPRESSIN 20 UNITS/100 ML INFUSION FOR SHOCK
INTRAVENOUS | Status: AC | PRN
  Administered 2023-05-09: .03 [IU]/min via INTRAVENOUS

## 2023-05-09 MED ORDER — EPINEPHRINE 1 MG/10ML IJ SOSY
PREFILLED_SYRINGE | INTRAMUSCULAR | Status: DC | PRN
  Administered 2023-05-09 (×11): 1 mg via INTRAVENOUS

## 2023-05-09 MED ORDER — LIDOCAINE HCL (PF) 1 % IJ SOLN
INTRAMUSCULAR | Status: DC | PRN
  Administered 2023-05-09: 2 mL

## 2023-05-09 MED ORDER — EPINEPHRINE HCL 5 MG/250ML IV SOLN IN NS
INTRAVENOUS | Status: AC | PRN
  Administered 2023-05-09: 20 ug/min via INTRAVENOUS

## 2023-05-10 NOTE — Discharge Summary (Addendum)
 Expiration summary:    Hospital course: Please see H&P for admission details.  The patient was referred for emergency coronary angiography after presenting to an outside hospital with inferolateral ST elevation myocardial infarction complicated by cardiac arrest and cardiogenic shock.  She was intubated, a central line was placed, and she was started on high-dose norepinephrine.  She was then transferred to [facility].  On arrival here the patient was in cardiogenic shock and hypotensive and tachycardic.  She was referred for emergency coronary angiography.  In the cardiac catheterization laboratory coronary angiography demonstrated no acute occlusions.  Patient's LVEDP was found to be severely elevated.  The patient became hypotensive and a pericardial effusion was detected by bedside echocardiography.  Emergent pericardiocentesis was performed and multiple rounds of CPR were administered.  Despite best resuscitative efforts, the patient remained hypotensive.  After conferring with the family, further resuscitative efforts were deferred.  Time of death was 2:06 AM on 06-05-23.

## 2023-05-10 NOTE — Progress Notes (Signed)
 Chaplain responds to code blue and provides spiritual care to patient's daughter Camelia Eng and two granddaughters. Chaplain remains with them through death notification and provides prayer at bedside after body is moved to [facility] for family to visit.

## 2023-05-10 DEATH — deceased
